# Patient Record
Sex: Female | Born: 1935 | Race: White | Hispanic: No | Smoking: Former smoker
Health system: Southern US, Community
[De-identification: ages and names within clinical notes are randomized; demographics above are authoritative.]

## PROBLEM LIST (undated history)

## (undated) DIAGNOSIS — F419 Anxiety disorder, unspecified: Secondary | ICD-10-CM

## (undated) DIAGNOSIS — Z950 Presence of cardiac pacemaker: Secondary | ICD-10-CM

## (undated) DIAGNOSIS — K5792 Diverticulitis of intestine, part unspecified, without perforation or abscess without bleeding: Secondary | ICD-10-CM

## (undated) DIAGNOSIS — I499 Cardiac arrhythmia, unspecified: Secondary | ICD-10-CM

## (undated) DIAGNOSIS — M6282 Rhabdomyolysis: Secondary | ICD-10-CM

## (undated) DIAGNOSIS — I495 Sick sinus syndrome: Secondary | ICD-10-CM

## (undated) DIAGNOSIS — K219 Gastro-esophageal reflux disease without esophagitis: Secondary | ICD-10-CM

## (undated) DIAGNOSIS — N289 Disorder of kidney and ureter, unspecified: Secondary | ICD-10-CM

## (undated) DIAGNOSIS — I10 Essential (primary) hypertension: Secondary | ICD-10-CM

## (undated) DIAGNOSIS — F22 Delusional disorders: Secondary | ICD-10-CM

## (undated) DIAGNOSIS — K623 Rectal prolapse: Secondary | ICD-10-CM

## (undated) DIAGNOSIS — F0280 Dementia in other diseases classified elsewhere without behavioral disturbance: Secondary | ICD-10-CM

## (undated) DIAGNOSIS — G3183 Dementia with Lewy bodies: Secondary | ICD-10-CM

## (undated) DIAGNOSIS — D649 Anemia, unspecified: Secondary | ICD-10-CM

## (undated) DIAGNOSIS — E119 Type 2 diabetes mellitus without complications: Secondary | ICD-10-CM

## (undated) DIAGNOSIS — G459 Transient cerebral ischemic attack, unspecified: Secondary | ICD-10-CM

## (undated) HISTORY — DX: Sick sinus syndrome: I49.5

## (undated) HISTORY — DX: Anxiety disorder, unspecified: F41.9

## (undated) HISTORY — DX: Gastro-esophageal reflux disease without esophagitis: K21.9

## (undated) HISTORY — PX: CATARACT EXTRACTION: SUR2

## (undated) HISTORY — DX: Rectal prolapse: K62.3

## (undated) HISTORY — PX: ABDOMINAL HYSTERECTOMY: SHX81

## (undated) HISTORY — DX: Cardiac arrhythmia, unspecified: I49.9

## (undated) HISTORY — PX: HERNIA REPAIR: SHX51

## (undated) HISTORY — PX: INSERT / REPLACE / REMOVE PACEMAKER: SUR710

## (undated) HISTORY — PX: ABDOMINAL SURGERY: SHX537

## (undated) HISTORY — PX: CHOLECYSTECTOMY: SHX55

## (undated) HISTORY — PX: CARDIAC SURGERY: SHX584

## (undated) HISTORY — PX: COLON SURGERY: SHX602

## (undated) HISTORY — PX: KNEE SURGERY: SHX244

## (undated) HISTORY — PX: NASAL SINUS SURGERY: SHX719

---

## 2016-04-18 DIAGNOSIS — L309 Dermatitis, unspecified: Secondary | ICD-10-CM | POA: Diagnosis not present

## 2016-05-09 DIAGNOSIS — D631 Anemia in chronic kidney disease: Secondary | ICD-10-CM | POA: Diagnosis not present

## 2016-05-09 DIAGNOSIS — R413 Other amnesia: Secondary | ICD-10-CM | POA: Diagnosis not present

## 2016-05-09 DIAGNOSIS — E1122 Type 2 diabetes mellitus with diabetic chronic kidney disease: Secondary | ICD-10-CM | POA: Diagnosis not present

## 2016-05-09 DIAGNOSIS — I10 Essential (primary) hypertension: Secondary | ICD-10-CM | POA: Diagnosis not present

## 2016-05-09 DIAGNOSIS — E785 Hyperlipidemia, unspecified: Secondary | ICD-10-CM | POA: Diagnosis not present

## 2016-05-09 DIAGNOSIS — N183 Chronic kidney disease, stage 3 (moderate): Secondary | ICD-10-CM | POA: Diagnosis not present

## 2016-05-09 DIAGNOSIS — Z950 Presence of cardiac pacemaker: Secondary | ICD-10-CM | POA: Diagnosis not present

## 2016-05-09 DIAGNOSIS — F3340 Major depressive disorder, recurrent, in remission, unspecified: Secondary | ICD-10-CM | POA: Diagnosis not present

## 2016-05-09 DIAGNOSIS — E790 Hyperuricemia without signs of inflammatory arthritis and tophaceous disease: Secondary | ICD-10-CM | POA: Diagnosis not present

## 2016-05-12 ENCOUNTER — Telehealth: Payer: Self-pay

## 2016-05-12 NOTE — Telephone Encounter (Signed)
SENT NOTES TO SCHEDULING 

## 2016-05-30 ENCOUNTER — Emergency Department (HOSPITAL_COMMUNITY)
Admission: EM | Admit: 2016-05-30 | Discharge: 2016-05-30 | Disposition: A | Discharge status: Home / Self Care | Payer: PPO | Attending: Emergency Medicine | Admitting: Emergency Medicine

## 2016-05-30 ENCOUNTER — Encounter (HOSPITAL_COMMUNITY): Payer: Self-pay | Admitting: Emergency Medicine

## 2016-05-30 DIAGNOSIS — K2970 Gastritis, unspecified, without bleeding: Secondary | ICD-10-CM | POA: Diagnosis not present

## 2016-05-30 DIAGNOSIS — I10 Essential (primary) hypertension: Secondary | ICD-10-CM | POA: Insufficient documentation

## 2016-05-30 DIAGNOSIS — Z87891 Personal history of nicotine dependence: Secondary | ICD-10-CM | POA: Insufficient documentation

## 2016-05-30 DIAGNOSIS — A09 Infectious gastroenteritis and colitis, unspecified: Secondary | ICD-10-CM | POA: Insufficient documentation

## 2016-05-30 DIAGNOSIS — R197 Diarrhea, unspecified: Secondary | ICD-10-CM | POA: Diagnosis not present

## 2016-05-30 DIAGNOSIS — R110 Nausea: Secondary | ICD-10-CM | POA: Diagnosis not present

## 2016-05-30 HISTORY — DX: Essential (primary) hypertension: I10

## 2016-05-30 HISTORY — DX: Disorder of kidney and ureter, unspecified: N28.9

## 2016-05-30 HISTORY — DX: Anemia, unspecified: D64.9

## 2016-05-30 HISTORY — DX: Diverticulitis of intestine, part unspecified, without perforation or abscess without bleeding: K57.92

## 2016-05-30 LAB — COMPREHENSIVE METABOLIC PANEL WITH GFR
ALT: 27 U/L (ref 14–54)
AST: 39 U/L (ref 15–41)
Albumin: 3.7 g/dL (ref 3.5–5.0)
Alkaline Phosphatase: 118 U/L (ref 38–126)
Anion gap: 11 (ref 5–15)
BUN: 20 mg/dL (ref 6–20)
CO2: 21 mmol/L — ABNORMAL LOW (ref 22–32)
Calcium: 9.6 mg/dL (ref 8.9–10.3)
Chloride: 111 mmol/L (ref 101–111)
Creatinine, Ser: 1 mg/dL (ref 0.44–1.00)
GFR calc Af Amer: >60 mL/min
GFR calc non Af Amer: 52 mL/min — ABNORMAL LOW
Glucose, Bld: 189 mg/dL — ABNORMAL HIGH (ref 65–99)
Potassium: 4.5 mmol/L (ref 3.5–5.1)
Sodium: 143 mmol/L (ref 135–145)
Total Bilirubin: 0.4 mg/dL (ref 0.3–1.2)
Total Protein: 6.8 g/dL (ref 6.5–8.1)

## 2016-05-30 LAB — CBC WITH DIFFERENTIAL/PLATELET
Basophils Absolute: 0 10*3/uL (ref 0.0–0.1)
Basophils Relative: 0 %
Eosinophils Absolute: 0.1 10*3/uL (ref 0.0–0.7)
Eosinophils Relative: 1 %
HCT: 40.8 % (ref 36.0–46.0)
Hemoglobin: 13.5 g/dL (ref 12.0–15.0)
Lymphocytes Relative: 21 %
Lymphs Abs: 2.4 10*3/uL (ref 0.7–4.0)
MCH: 29.2 pg (ref 26.0–34.0)
MCHC: 33.1 g/dL (ref 30.0–36.0)
MCV: 88.1 fL (ref 78.0–100.0)
Monocytes Absolute: 0.8 10*3/uL (ref 0.1–1.0)
Monocytes Relative: 6 %
Neutro Abs: 8.4 10*3/uL — ABNORMAL HIGH (ref 1.7–7.7)
Neutrophils Relative %: 72 %
Platelets: 201 10*3/uL (ref 150–400)
RBC: 4.63 MIL/uL (ref 3.87–5.11)
RDW: 12.8 % (ref 11.5–15.5)
WBC: 11.7 10*3/uL — ABNORMAL HIGH (ref 4.0–10.5)

## 2016-05-30 LAB — LIPASE, BLOOD: Lipase: 25 U/L (ref 11–51)

## 2016-05-30 MED ORDER — ONDANSETRON HCL 4 MG PO TABS: 4.0000 mg | ORAL_TABLET | Freq: Four times a day (QID) | ORAL | 0 refills | Status: DC

## 2016-05-30 MED ORDER — PROMETHAZINE HCL 25 MG PO TABS: 25.0000 mg | ORAL_TABLET | Freq: Four times a day (QID) | ORAL | 0 refills | Status: DC | PRN

## 2016-05-30 MED ORDER — LOPERAMIDE HCL 2 MG PO CAPS: 2.0000 mg | ORAL_CAPSULE | Freq: Four times a day (QID) | ORAL | 0 refills | Status: DC | PRN

## 2016-05-30 MED ORDER — SODIUM CHLORIDE 0.9 % IV BOLUS (SEPSIS)
1000.0000 mL | Freq: Once | INTRAVENOUS | Status: AC
  Administered 2016-05-30: 1000 mL via INTRAVENOUS

## 2016-05-30 MED ORDER — ONDANSETRON HCL 4 MG/2ML IJ SOLN
4.0000 mg | Freq: Once | INTRAMUSCULAR | Status: AC
  Administered 2016-05-30: 4 mg via INTRAVENOUS
  Filled 2016-05-30: qty 2

## 2016-05-30 NOTE — ED Notes (Signed)
This writer attempt blood draw twice without successful. Pt jumps with needle stick.  Spencer aware.

## 2016-05-30 NOTE — ED Notes (Signed)
Phlebotomy at bedside.

## 2016-05-30 NOTE — ED Triage Notes (Signed)
Per EMS, patient from home c/o generalized abdominal pain and 7 episodes of diarrhea since eating "a batch of clams from her freezer" for dinner. Ambulatory with assistance. A&Ox4.

## 2016-05-30 NOTE — ED Notes (Signed)
RN at bedside collecting labs 

## 2016-05-30 NOTE — ED Notes (Signed)
This Clinical research associatewriter attempted once to collect labs. Not enough blood to fill tube. PT/family request for special machine (ultrasound) to be used due to hard stick

## 2016-05-30 NOTE — ED Notes (Signed)
ED Provider at bedside. 

## 2016-05-30 NOTE — ED Notes (Signed)
Bed: [id number] Expected date:  Expected time:  Means of arrival:  Comments: 83 yo abd pain, diarrhea

## 2016-05-30 NOTE — ED Notes (Addendum)
Main lab phlebotomy paged for blood draw. MD made aware of delay.

## 2016-05-30 NOTE — ED Provider Notes (Signed)
[facility]-EMERGENCY DEPT Provider Note   CSN: [id number] Arrival date & time: 05/30/16  1116     History   Chief Complaint Chief Complaint  Patient presents with  . Abdominal Pain  . Diarrhea    HPI Monica Choi is a 83 y.o. female.  She is an elderly female with a history of hypertension, diverticulitis and anemia presenting today with abdominal pain, nausea and diarrhea. Patient states yesterday afternoon she started to feel queasy which worsened as the night progressed. Around 6:00 she decided to make some food but only was able to take one bite because it did not taste good. By 7 PM she started to have diarrhea stool. She also complains of a discomfort and bloating in her epigastric region. This has persisted all night long. She states every 1-2 hours she would have an episode of diarrhea and severe abdominal cramping which would then improve until the next episode. She has nausea but denies any vomiting. No melena or bloody stool. She denies fever. No urinary symptoms. No chest pain or shortness of breath. She denies any URI symptoms. No recent medication changes. She did take a Tylenol because she has chronic shoulder pain and having to get up and down from bed all night long has made her right shoulder hurts worse with the Tylenol seemed to improve those symptoms. She denies any recent antibiotic use or travel. No recent procedures.   The history is provided by the patient.  Abdominal Pain   This is a new problem. Associated symptoms include diarrhea.  Diarrhea   Associated symptoms include abdominal pain.    Past Medical History:  Diagnosis Date  . Anemia   . Diverticulitis   . Hypertension   . Renal disorder     There are no active problems to display for this patient.   Past Surgical History:  Procedure Laterality Date  . ABDOMINAL HYSTERECTOMY    . ABDOMINAL SURGERY      OB History    No data available       Home Medications    Prior to Admission  medications   Not on File    Family History No family history on file.  Social History Social History  Substance Use Topics  . Smoking status: Former Games developer  . Smokeless tobacco: Never Used  . Alcohol use Not on file     Allergies   Patient has no allergy information on record.   Review of Systems Review of Systems  Gastrointestinal: Positive for abdominal pain and diarrhea.  All other systems reviewed and are negative.    Physical Exam Updated Vital Signs BP 197/78 (BP Location: Left Arm)   Pulse 92   Temp 98.1 F (36.7 C) (Oral)   Resp 18   Ht 5\' 4"  (1.626 m)   Wt 140 lb (63.5 kg)   SpO2 98%   BMI 24.03 kg/m   Physical Exam  Constitutional: She is oriented to person, place, and time. She appears well-developed and well-nourished. No distress.  HENT:  Head: Normocephalic and atraumatic.  Mouth/Throat: Oropharynx is clear and moist. Mucous membranes are dry.  Eyes: Conjunctivae and EOM are normal. Pupils are equal, round, and reactive to light.  Neck: Normal range of motion. Neck supple.  Cardiovascular: Normal rate, regular rhythm and intact distal pulses.   No murmur heard. Pulmonary/Chest: Effort normal and breath sounds normal. No respiratory distress. She has no wheezes. She has no rales.  Abdominal: Soft. She exhibits distension. There is no tenderness. There  is no rebound and no guarding.  Mild epigastric distention. Multiple well-healed surgical scars with potential ventral hernia but soft and reducible. Positive bowel sounds mild tenderness but no rebound or guarding  Musculoskeletal: Normal range of motion. She exhibits no edema or tenderness.  Neurological: She is alert and oriented to person, place, and time.  Skin: Skin is warm and dry. No rash noted. No erythema.  Psychiatric: She has a normal mood and affect. Her behavior is normal.  Nursing note and vitals reviewed.    ED Treatments / Results  Labs (all labs ordered are listed, but only  abnormal results are displayed) Labs Reviewed  CBC WITH DIFFERENTIAL/PLATELET - Abnormal; Notable for the following:       Result Value   WBC 11.7 (*)    Neutro Abs 8.4 (*)    All other components within normal limits  COMPREHENSIVE METABOLIC PANEL - Abnormal; Notable for the following:    CO2 21 (*)    Glucose, Bld 189 (*)    GFR calc non Af Amer 52 (*)    All other components within normal limits  LIPASE, BLOOD    EKG  EKG Interpretation None       Radiology No results found.  Procedures Procedures (including critical care time)  Medications Ordered in ED Medications  ondansetron (ZOFRAN) injection 4 mg (not administered)  sodium chloride 0.9 % bolus 1,000 mL (not administered)     Initial Impression / Assessment and Plan / ED Course  I have reviewed the triage vital signs and the nursing notes.  Pertinent labs & imaging results that were available during my care of the patient were reviewed by me and considered in my medical decision making (see chart for details).     Pt with symptoms most consistent with a viral process with abrupt onset of epigastric discomfort crampig /diarrhea.  Denies bad food exposure and recent travel out of the country.  No recent abx.  No hx concerning for GU pathology or kidney stones.  Pt is awake and alert on exam without peritoneal signs.  Low suspicion for C. difficile, diverticulitis or pancreatitis at this time. Patient has had a cholecystectomy and appendectomy. She does not know if she's had any sick exposures. Will treat with IV fluids and Zofran. CBC, CMP, lipase pending  3:20 PM Labs relatively normal and pt feeling better after zofran and IVF.  Tolerating po's.  Will d/c home.    Final Clinical Impressions(s) / ED Diagnoses   Final diagnoses:  Diarrhea of presumed infectious origin    New Prescriptions New Prescriptions   LOPERAMIDE (IMODIUM) 2 MG CAPSULE    Take 1 capsule (2 mg total) by mouth 4 (four) times daily  as needed for diarrhea or loose stools.   ONDANSETRON (ZOFRAN) 4 MG TABLET    Take 1 tablet (4 mg total) by mouth every 6 (six) hours.     Gwyneth SproutWhitney Natale Barba, MD 05/30/16 [phone]

## 2016-06-13 DIAGNOSIS — I10 Essential (primary) hypertension: Secondary | ICD-10-CM | POA: Diagnosis not present

## 2016-06-13 DIAGNOSIS — M109 Gout, unspecified: Secondary | ICD-10-CM | POA: Diagnosis not present

## 2016-06-13 DIAGNOSIS — F3340 Major depressive disorder, recurrent, in remission, unspecified: Secondary | ICD-10-CM | POA: Diagnosis not present

## 2016-06-13 DIAGNOSIS — M25512 Pain in left shoulder: Secondary | ICD-10-CM | POA: Diagnosis not present

## 2016-06-13 DIAGNOSIS — M25511 Pain in right shoulder: Secondary | ICD-10-CM | POA: Diagnosis not present

## 2016-06-18 ENCOUNTER — Telehealth: Payer: Self-pay | Admitting: Cardiology

## 2016-06-18 NOTE — Telephone Encounter (Signed)
[facility] on 07/09/16 with Dr SwazilandJordan.  Records put with Dr Elvis CoilJordan's schedule for 07/09/16. lp

## 2016-06-23 DIAGNOSIS — G8929 Other chronic pain: Secondary | ICD-10-CM | POA: Diagnosis not present

## 2016-06-23 DIAGNOSIS — M25511 Pain in right shoulder: Secondary | ICD-10-CM | POA: Diagnosis not present

## 2016-06-23 DIAGNOSIS — M25512 Pain in left shoulder: Secondary | ICD-10-CM | POA: Diagnosis not present

## 2016-07-03 DIAGNOSIS — N183 Chronic kidney disease, stage 3 (moderate): Secondary | ICD-10-CM | POA: Diagnosis not present

## 2016-07-03 DIAGNOSIS — R413 Other amnesia: Secondary | ICD-10-CM | POA: Diagnosis not present

## 2016-07-03 DIAGNOSIS — E1122 Type 2 diabetes mellitus with diabetic chronic kidney disease: Secondary | ICD-10-CM | POA: Diagnosis not present

## 2016-07-03 DIAGNOSIS — E785 Hyperlipidemia, unspecified: Secondary | ICD-10-CM | POA: Diagnosis not present

## 2016-07-03 DIAGNOSIS — I129 Hypertensive chronic kidney disease with stage 1 through stage 4 chronic kidney disease, or unspecified chronic kidney disease: Secondary | ICD-10-CM | POA: Diagnosis not present

## 2016-07-03 DIAGNOSIS — Z950 Presence of cardiac pacemaker: Secondary | ICD-10-CM | POA: Diagnosis not present

## 2016-07-03 DIAGNOSIS — M109 Gout, unspecified: Secondary | ICD-10-CM | POA: Diagnosis not present

## 2016-07-03 DIAGNOSIS — D509 Iron deficiency anemia, unspecified: Secondary | ICD-10-CM | POA: Diagnosis not present

## 2016-07-04 ENCOUNTER — Other Ambulatory Visit: Payer: Self-pay | Admitting: Nephrology

## 2016-07-04 DIAGNOSIS — N183 Chronic kidney disease, stage 3 unspecified: Secondary | ICD-10-CM

## 2016-07-04 DIAGNOSIS — I129 Hypertensive chronic kidney disease with stage 1 through stage 4 chronic kidney disease, or unspecified chronic kidney disease: Secondary | ICD-10-CM

## 2016-07-06 DIAGNOSIS — G459 Transient cerebral ischemic attack, unspecified: Secondary | ICD-10-CM

## 2016-07-06 HISTORY — DX: Transient cerebral ischemic attack, unspecified: G45.9

## 2016-07-08 NOTE — Progress Notes (Signed)
Cardiology Office Note    Date:  07/09/2016   ID:  Monica Choi, DOB 1933-12-26, MRN [id number]  PCP:  Monica Obey, MD  Cardiologist:  Monica Langille Martinique, MD    History of Present Illness:  Monica Choi is a 83 y.o. female seen at the request of Dr. Doreene Choi to establish cardiac care. She has a history of HTN, HLD, and prior pacemaker implant ([employer] PM [id number] serial [id number]). Per records this was placed in January 2016. The patient reports she had syncopal episodes and pauses of 6-7 seconds.  Echo at that time showed normal LV function and mild aortic and mitral insufficiency. She had a normal cardiac cath in August 2015. She has a history of DM type 2 with CKD stage 3 and chronic anemia. She has moved from [place] to live closer to her daughter here.  She is seen with her daughter today. States she is not very active. States her "heart pounds" with activity. Denies any chest pain, palpitations, dizziness, syncope, edema, orthopnea, PND. Does note a 20 lb weight loss. Memory loss. Bruises easily. Denies any claudication. Has seen Nephrology recently- Monica Choi and some type of renal scan is planned.    Past Medical History:  Diagnosis Date  . Anemia   . Anxiety   . Arrhythmia   . Diverticulitis   . GERD (gastroesophageal reflux disease)   . Hypertension   . Rectal prolapse   . Renal disorder   . Sinus node dysfunction [facility])     Past Surgical History:  Procedure Laterality Date  . ABDOMINAL HYSTERECTOMY    . ABDOMINAL SURGERY    . CARDIAC SURGERY    . CATARACT EXTRACTION Bilateral   . CHOLECYSTECTOMY    . COLON SURGERY    . HERNIA REPAIR    . KNEE SURGERY    . NASAL SINUS SURGERY      Current Medications: Outpatient Medications Prior to Visit  Medication Sig Dispense Refill  . acetaminophen (ACETAMINOPHEN 8 HOUR) 650 MG CR tablet Take 1,300 mg by mouth every 8 (eight) hours as needed for pain.     Marland Kitchen allopurinol (ZYLOPRIM) 100 MG tablet Take 1 tablet by mouth  daily.    Marland Kitchen amLODipine (NORVASC) 10 MG tablet Take 10 mg by mouth daily.    Marland Kitchen aspirin 81 MG chewable tablet Chew 81 mg by mouth daily.    Marland Kitchen BIOTIN 5000 PO Take 5,000 mg by mouth daily.    Marland Kitchen bismuth subsalicylate (PEPTO BISMOL) 262 MG chewable tablet Chew 2 tablets by mouth as needed.    . cetirizine (ZYRTEC) 10 MG tablet Take 10 mg by mouth daily.    . Cholecalciferol (VITAMIN D) 2000 units CAPS Take 5,000 Units by mouth daily.    . DULoxetine (CYMBALTA) 30 MG capsule Take 30 mg by mouth 2 (two) times daily.    . hydrOXYzine (ATARAX/VISTARIL) 10 MG tablet Take 10 mg by mouth at bedtime.    Marland Kitchen loperamide (IMODIUM) 2 MG capsule Take 1 capsule (2 mg total) by mouth 4 (four) times daily as needed for diarrhea or loose stools. 12 capsule 0  . nitroGLYCERIN (NITROSTAT) 0.4 MG SL tablet Place 0.4 mg under the tongue every 5 (five) minutes as needed for chest pain.    Marland Kitchen ondansetron (ZOFRAN) 4 MG tablet Take 1 tablet (4 mg total) by mouth every 6 (six) hours. 12 tablet 0  . pantoprazole (PROTONIX) 20 MG tablet Take 20 mg by mouth daily.    . promethazine (  PHENERGAN) 25 MG tablet Take 1 tablet (25 mg total) by mouth every 6 (six) hours as needed for nausea or vomiting. 10 tablet 0  . ramipril (ALTACE) 10 MG capsule Take 10 mg by mouth 2 (two) times daily.    . sertraline (ZOLOFT) 100 MG tablet Take 100 mg by mouth as needed.    . simvastatin (ZOCOR) 10 MG tablet Take 10 mg by mouth daily.    . sodium bicarbonate 650 MG tablet Take 650 mg by mouth daily.    . DULoxetine (CYMBALTA) 30 MG capsule Take 30 mg by mouth 2 (two) times daily.    . hydrOXYzine (ATARAX/VISTARIL) 10 MG tablet Take 10 mg by mouth daily.    . nitroGLYCERIN (NITROSTAT) 0.3 MG SL tablet Place 0.3 mg under the tongue as needed for chest pain.    . promethazine (PHENERGAN) 25 MG tablet Take 25 mg by mouth as needed.     No facility-administered medications prior to visit.      Allergies:   Morphine; Sulfa antibiotics; Amoxicillin;  Benzonatate; Buprenorphine; Ciprofloxacin; and Morphine and related   Social History   Social History  . Marital status: Married    Spouse name: N/A  . Number of children: N/A  . Years of education: N/A   Social History Main Topics  . Smoking status: Former Research scientist (life sciences)  . Smokeless tobacco: Never Used  . Alcohol use None  . Drug use: Unknown  . Sexual activity: Not Asked   Other Topics Concern  . None   Social History Narrative  . None     Family History:  The patient's family history includes Heart attack in her brother and father.   ROS:   Please see the history of present illness.   Patient also complains of hair loss, easy bruisability, itching, poor memory, weight loss, increased varicose veins.  ROS All other systems reviewed and are negative.   PHYSICAL EXAM:   VS:  BP (!) 171/72 (BP Location: Right Arm)   Pulse 91   Ht '5\' 4"'$  (1.626 m)   Wt 143 lb 3.2 oz (65 kg)   BMI 24.58 kg/m    GEN: Well nourished, elderly WF, in no acute distress  HEENT: normal  Neck: no JVD, carotid bruits, or masses Cardiac: RRR; normal S1-2 with grade 6-1/4 systolic murmur heard throughout precordium but most pronounced at the apex. No edema. No rub. Pacer site in left subclavicular area is OK.  Respiratory:  clear to auscultation bilaterally, normal work of breathing GI: soft, nontender, nondistended, + BS, midline abdominal and renal bruits heard. Bilateral femoral bruits R>L.  MS: no deformity or atrophy  Skin: warm and dry, no rash. Bruising on hands and feet. Superficial varicosities in LE Neuro:  Alert and Oriented x 3, Strength and sensation are intact Psych: euthymic mood, full affect, memory is poor. Very talkative.  Wt Readings from Last 3 Encounters:  07/09/16 143 lb 3.2 oz (65 kg)  05/30/16 140 lb (63.5 kg)      Studies/Labs Reviewed:   EKG:  EKG is ordered today.  The ekg ordered today demonstrates NSR with LAFB, LVH with repolarization abnormality. Cannot rule out  septal infarct age undetermined. I have personally reviewed and interpreted this study.   Recent Labs: 05/30/2016: ALT 27; BUN 20; Creatinine, Ser 1.00; Hemoglobin 13.5; Platelets 201; Potassium 4.5; Sodium 143   Lipid Panel No results found for: CHOL, TRIG, HDL, CHOLHDL, VLDL, LDLCALC, LDLDIRECT  Additional studies/ records that were reviewed today include:  Labs dated 05/09/16: BUN 20, creatinine 1.29, sodium 150, alk phos 161. Other chemistries normal. A1c 7.7%.   ASSESSMENT:    1. Sinus node dysfunction (HCC)   2. Essential hypertension   3. Abdominal bruit   4. Renal bruit   5. Hypertensive heart disease without heart failure      PLAN:  In order of problems listed above:  1. She is s/p pacemaker in 2016. No recurrent syncope. Needs to get established with EP in our pacer clinic for follow up.  2. Patient has hypertension with hypertensive heart disease (LVH on Ecg). In presence of CKD and renal artery bruits need to evaluate for RAS with renal duplex study. This may have already been ordered by Nephrology. Will obtain records to confirm. I will defer medical treatment of her HTN to her primary care and Nephrology so that her care can be better coordinated. 3. PAD with abdominal, renal, femoral bruits. No claudication and peripheral pulses are good. Encourage walking. Agree with ASA 81 mg daily. Work on risk factors of BP, hypercholesterolemia, DM.    Since primary cardiac issue is her sinus node dysfunction will arrange follow up with EP. I will see her as needed.     Medication Adjustments/Labs and Tests Ordered: Current medicines are reviewed at length with the patient today.  Concerns regarding medicines are outlined above.  Medication changes, Labs and Tests ordered today are listed in the Patient Instructions below. Patient Instructions  We will arrange follow up in our pacemaker clinic with one of our Electrophysiologist  We will request records from Monica Choi.  You need a doppler/ultrasound of your kidneys        Signed, Vangie Henthorn Martinique, MD  07/09/2016 9:53 AM    [facility] [address] [place], [place], [address] [phone]

## 2016-07-09 ENCOUNTER — Ambulatory Visit (INDEPENDENT_AMBULATORY_CARE_PROVIDER_SITE_OTHER): Payer: PPO | Admitting: Cardiology

## 2016-07-09 ENCOUNTER — Encounter: Payer: Self-pay | Admitting: Cardiology

## 2016-07-09 VITALS — BP 171/72 | HR 91 | Ht 64.0 in | Wt 143.2 lb

## 2016-07-09 DIAGNOSIS — I495 Sick sinus syndrome: Secondary | ICD-10-CM

## 2016-07-09 DIAGNOSIS — R0989 Other specified symptoms and signs involving the circulatory and respiratory systems: Secondary | ICD-10-CM | POA: Diagnosis not present

## 2016-07-09 DIAGNOSIS — I10 Essential (primary) hypertension: Secondary | ICD-10-CM | POA: Diagnosis not present

## 2016-07-09 DIAGNOSIS — I119 Hypertensive heart disease without heart failure: Secondary | ICD-10-CM

## 2016-07-09 NOTE — Patient Instructions (Signed)
We will arrange follow up in our pacemaker clinic with one of our Electrophysiologist  We will request records from Dr. Eulogio Ditch. You need a doppler/ultrasound of your kidneys

## 2016-07-17 ENCOUNTER — Ambulatory Visit (INDEPENDENT_AMBULATORY_CARE_PROVIDER_SITE_OTHER): Payer: PPO | Admitting: Cardiology

## 2016-07-17 ENCOUNTER — Other Ambulatory Visit: Payer: PPO

## 2016-07-17 ENCOUNTER — Encounter: Payer: Self-pay | Admitting: Cardiology

## 2016-07-17 VITALS — BP 160/88 | HR 94 | Ht 64.0 in | Wt 141.8 lb

## 2016-07-17 DIAGNOSIS — I10 Essential (primary) hypertension: Secondary | ICD-10-CM

## 2016-07-17 DIAGNOSIS — I495 Sick sinus syndrome: Secondary | ICD-10-CM

## 2016-07-17 MED ORDER — CARVEDILOL 12.5 MG PO TABS: 12.5000 mg | ORAL_TABLET | Freq: Two times a day (BID) | ORAL | 11 refills | Status: AC

## 2016-07-17 NOTE — Patient Instructions (Addendum)
Medication Instructions:    Your physician has recommended you make the following change in your medication: 1) START Carvedilol 12.5 mg twice day  --- If you need a refill on your cardiac medications before your next appointment, please call your pharmacy. ---  Labwork:  None ordered  Testing/Procedures:  None ordered  Follow-Up:  Your physician recommends that you schedule a follow-up appointment in: 3 months with device clinic.   Your physician wants you to follow-up in: 1 year with Dr. Elberta Fortis.  You will receive a reminder letter in the mail two months in advance. If you don't receive a letter, please call our office to schedule the follow-up appointment.  Thank you for choosing [facility]!!   Dory Horn, RN [phone]    Any Other Special Instructions Will Be Listed Below (If Applicable).  Carvedilol tablets What is this medicine? CARVEDILOL (KAR ve dil ol) is a beta-blocker. Beta-blockers reduce the workload on the heart and help it to beat more regularly. This medicine is used to treat high blood pressure and heart failure. This medicine may be used for other purposes; ask your health care provider or pharmacist if you have questions. COMMON BRAND NAME(S): Coreg What should I tell my health care provider before I take this medicine? They need to know if you have any of these conditions: -circulation problems -diabetes -history of heart attack or heart disease -liver disease -lung or breathing disease, like asthma or emphysema -pheochromocytoma -slow or irregular heartbeat -thyroid disease -an unusual or allergic reaction to carvedilol, other beta-blockers, medicines, foods, dyes, or preservatives -pregnant or trying to get pregnant -breast-feeding How should I use this medicine? Take this medicine by mouth with a glass of water. Follow the directions on the prescription label. It is best to take the tablets with food. Take your doses at regular  intervals. Do not take your medicine more often than directed. Do not stop taking except on the advice of your doctor or health care professional. Talk to your pediatrician regarding the use of this medicine in children. Special care may be needed. Overdosage: If you think you have taken too much of this medicine contact a poison control center or emergency room at once. NOTE: This medicine is only for you. Do not share this medicine with others. What if I miss a dose? If you miss a dose, take it as soon as you can. If it is almost time for your next dose, take only that dose. Do not take double or extra doses. What may interact with this medicine? This medicine may interact with the following medications: -certain medicines for blood pressure, heart disease, irregular heart beat -certain medicines for depression, like fluoxetine or paroxetine -certain medicines for diabetes, like glipizide or glyburide -cimetidine -clonidine -cyclosporine -digoxin -MAOIs like Carbex, Eldepryl, Marplan, Nardil, and Parnate -reserpine -rifampin This list may not describe all possible interactions. Give your health care provider a list of all the medicines, herbs, non-prescription drugs, or dietary supplements you use. Also tell them if you smoke, drink alcohol, or use illegal drugs. Some items may interact with your medicine. What should I watch for while using this medicine? Check your heart rate and blood pressure regularly while you are taking this medicine. Ask your doctor or health care professional what your heart rate and blood pressure should be, and when you should contact him or her. Do not stop taking this medicine suddenly. This could lead to serious heart-related effects. Contact your doctor or health care professional  if you have difficulty breathing while taking this drug. Check your weight daily. Ask your doctor or health care professional when you should notify him/her of any weight gain. You  may get drowsy or dizzy. Do not drive, use machinery, or do anything that requires mental alertness until you know how this medicine affects you. To reduce the risk of dizzy or fainting spells, do not sit or stand up quickly. Alcohol can make you more drowsy, and increase flushing and rapid heartbeats. Avoid alcoholic drinks. If you have diabetes, check your blood sugar as directed. Tell your doctor if you have changes in your blood sugar while you are taking this medicine. If you are going to have surgery, tell your doctor or health care professional that you are taking this medicine. What side effects may I notice from receiving this medicine? Side effects that you should report to your doctor or health care professional as soon as possible: -allergic reactions like skin rash, itching or hives, swelling of the face, lips, or tongue -breathing problems -dark urine -irregular heartbeat -swollen legs or ankles -vomiting -yellowing of the eyes or skin Side effects that usually do not require medical attention (report to your doctor or health care professional if they continue or are bothersome): -change in sex drive or performance -diarrhea -dry eyes (especially if wearing contact lenses) -dry, itching skin -headache -nausea -unusually tired This list may not describe all possible side effects. Call your doctor for medical advice about side effects. You may report side effects to FDA at 1-800-FDA-1088. Where should I keep my medicine? Keep out of the reach of children. Store at room temperature below 30 degrees C (86 degrees F). Protect from moisture. Keep container tightly closed. Throw away any unused medicine after the expiration date. NOTE: This sheet is a summary. It may not cover all possible information. If you have questions about this medicine, talk to your doctor, pharmacist, or health care provider.  2018 Elsevier/Gold Standard (2012-11-28 14:12:02)

## 2016-07-17 NOTE — Addendum Note (Signed)
Addended by: Baird Lyons on: 07/17/2016 12:06 PM   Modules accepted: Orders

## 2016-07-17 NOTE — Progress Notes (Signed)
Electrophysiology Office Note   Date:  07/17/2016   ID:  Monica Choi, DOB July 12, 1933, MRN [id number]  PCP:  Delbert Harness, MD  Cardiologist:  [place] Primary Electrophysiologist:  Regan Lemming, MD    Chief Complaint  Patient presents with  . Pacemaker Check    Sinus node dysfunction     History of Present Illness: Monica Choi is a 83 y.o. female who is being seen today for the evaluation of sinus node dysfunction and pacemaker at the request of Macy Mis, MD. Presenting today for electrophysiology evaluation. She has a history of HTN, HLD, and prior pacemaker implant [facility] Jude Model PM [id number] serial [id number]). Per records this was placed in January 2016. The patient reports she had syncopal episodes and pauses of 6-7 seconds.  Echo at that time showed normal LV function and mild aortic and mitral insufficiency. She had a normal cardiac cath in August 2015. She has a history of DM type 2 with CKD stage 3 and chronic anemia. She has moved from [place] to live closer to her daughter here.   Today, she denies symptoms of palpitations, chest pain, shortness of breath, orthopnea, PND, lower extremity edema, claudication, dizziness, presyncope, syncope, bleeding, or neurologic sequela. The patient is tolerating medications without difficulties.    Past Medical History:  Diagnosis Date  . Anemia   . Anxiety   . Arrhythmia   . Diverticulitis   . GERD (gastroesophageal reflux disease)   . Hypertension   . Rectal prolapse   . Renal disorder   . Sinus node dysfunction [facility])    Past Surgical History:  Procedure Laterality Date  . ABDOMINAL HYSTERECTOMY    . ABDOMINAL SURGERY    . CARDIAC SURGERY    . CATARACT EXTRACTION Bilateral   . CHOLECYSTECTOMY    . COLON SURGERY    . HERNIA REPAIR    . KNEE SURGERY    . NASAL SINUS SURGERY       Current Outpatient Prescriptions  Medication Sig Dispense Refill  . acetaminophen (ACETAMINOPHEN 8 HOUR) 650 MG CR tablet Take 1,300  mg by mouth every 8 (eight) hours as needed for pain.     Marland Kitchen allopurinol (ZYLOPRIM) 100 MG tablet Take 1 tablet by mouth daily.    Marland Kitchen amLODipine (NORVASC) 10 MG tablet Take 10 mg by mouth daily.    Marland Kitchen aspirin 81 MG chewable tablet Chew 81 mg by mouth daily.    Marland Kitchen BIOTIN 5000 PO Take 5,000 mg by mouth daily.    Marland Kitchen bismuth subsalicylate (PEPTO BISMOL) 262 MG chewable tablet Chew 2 tablets by mouth as needed.    . cetirizine (ZYRTEC) 10 MG tablet Take 10 mg by mouth daily.    . Cholecalciferol (VITAMIN D) 2000 units CAPS Take 5,000 Units by mouth daily.    . DULoxetine (CYMBALTA) 30 MG capsule Take 30 mg by mouth 2 (two) times daily.    . hydrOXYzine (ATARAX/VISTARIL) 10 MG tablet Take 10 mg by mouth at bedtime.    . nitroGLYCERIN (NITROSTAT) 0.4 MG SL tablet Place 0.4 mg under the tongue every 5 (five) minutes as needed for chest pain.    . pantoprazole (PROTONIX) 20 MG tablet Take 20 mg by mouth daily.    . ramipril (ALTACE) 10 MG capsule Take 10 mg by mouth 2 (two) times daily.    . sertraline (ZOLOFT) 100 MG tablet Take 100 mg by mouth as needed.    . simvastatin (ZOCOR) 10 MG tablet Take 10 mg  by mouth daily.    . sodium bicarbonate 650 MG tablet Take 650 mg by mouth daily.     No current facility-administered medications for this visit.     Allergies:   Morphine; Sulfa antibiotics; Amoxicillin; Benzonatate; Buprenorphine; Ciprofloxacin; and Morphine and related   Social History:  The patient  reports that she has quit smoking. She has never used smokeless tobacco.   Family History:  The patient's family history includes Heart attack in her brother and father.    ROS:  Please see the history of present illness.   Otherwise, review of systems is positive for Weight loss, fatigue, sweats, chills, leg pain, leg swelling, hearing loss, visual changes, abdominal pain, diarrhea, nausea, back pain, muscle pain, joint swelling, balance problems, dizziness, easy bruising, bleeding.   All other systems  are reviewed and negative.    PHYSICAL EXAM: VS:  BP (!) 160/88   Pulse 94   Ht  (1.626 m)   Wt 141 lb 12.8 oz (64.3 kg)   SpO2 96%   BMI 24.34 kg/m  , BMI Body mass index is 24.34 kg/m. GEN: Well nourished, well developed, in no acute distress  HEENT: normal  Neck: no JVD, carotid bruits, or masses Cardiac: RRR; no murmurs, rubs, or gallops,no edema  Respiratory:  clear to auscultation bilaterally, normal work of breathing GI: soft, nontender, nondistended, + BS MS: no deformity or atrophy  Skin: warm and dry,  device pocket is well healed Neuro:  Strength and sensation are intact Psych: euthymic mood, full affect  EKG:  EKG is ordered today. Personal review of the ekg ordered 07/09/16 shows sinus rhythm, left anterior fascicular block, anterior Q waves, LVH   Device interrogation is reviewed today in detail.  See PaceArt for details.   Recent Labs: 05/30/2016: ALT 27; BUN 20; Creatinine, Ser 1.00; Hemoglobin 13.5; Platelets 201; Potassium 4.5; Sodium 143    Lipid Panel  No results found for: CHOL, TRIG, HDL, CHOLHDL, VLDL, LDLCALC, LDLDIRECT   Wt Readings from Last 3 Encounters:  07/17/16 141 lb 12.8 oz (64.3 kg)  07/09/16 143 lb 3.2 oz (65 kg)  05/30/16 140 lb (63.5 kg)      Other studies Reviewed: Additional studies/ records that were reviewed today include: Epic notes  ASSESSMENT AND PLAN:  1.  Sinus node dysfunction: s/p St. Jude dual chamber pacemaker. Pacemaker functioning appropriately. No changes made to the pacemaker today. She has 10 years left on her battery. We'll have her follow-up in pacemaker clinic in 3 months to discuss remote monitoring.  2. Hypertension: Pressure is significantly elevated today. Per the patient her blood pressure is usually in the 150s, but has been as high as the 180s systolic. She did have a few episodes of a one-to-one atrial tachycardia on her monitor. We'll add 12.5 mg of carvedilol. Other blood pressure medication  adjustments per PCP.  Current medicines are reviewed at length with the patient today.   The patient does not have concerns regarding her medicines.  The following changes were made today:  none  Labs/ tests ordered today include:  No orders of the defined types were placed in this encounter.    Disposition:   FU with Foster Sonnier 1 year  Signed, Arlys Scatena Jorja Loa, MD  07/17/2016 11:50 AM     [facility] [address] [place] [address] [phone] (office) [phone] (fax)

## 2016-07-18 LAB — CUP PACEART INCLINIC DEVICE CHECK
Brady Statistic RV Percent Paced: 0.09 %
Date Time Interrogation Session: 20180412152924
Implantable Lead Implant Date: 20160108
Implantable Lead Location: 753862
Implantable Pulse Generator Implant Date: 20160108
Lead Channel Impedance Value: 587.5 Ohm
Lead Channel Pacing Threshold Amplitude: 0.5 V
Lead Channel Pacing Threshold Amplitude: 0.75 V
Lead Channel Sensing Intrinsic Amplitude: 12 mV
Lead Channel Setting Pacing Pulse Width: 0.4 ms
Lead Channel Setting Sensing Sensitivity: 2 mV
MDC IDC LEAD IMPLANT DT: 20160108
MDC IDC LEAD LOCATION: 753862
MDC IDC MSMT BATTERY VOLTAGE: 3.01 V
MDC IDC MSMT LEADCHNL RA IMPEDANCE VALUE: 412.5 Ohm
MDC IDC MSMT LEADCHNL RA PACING THRESHOLD PULSEWIDTH: 0.4 ms
MDC IDC MSMT LEADCHNL RA SENSING INTR AMPL: 5 mV
MDC IDC MSMT LEADCHNL RV PACING THRESHOLD PULSEWIDTH: 0.4 ms
MDC IDC PG SERIAL: 7661751
MDC IDC SET LEADCHNL RA PACING AMPLITUDE: 1.5 V
MDC IDC SET LEADCHNL RV PACING AMPLITUDE: 1 V
MDC IDC STAT BRADY RA PERCENT PACED: 2.4 %
Pulse Gen Model: 2240

## 2016-07-23 ENCOUNTER — Ambulatory Visit
Admission: RE | Admit: 2016-07-23 | Discharge: 2016-07-23 | Disposition: A | Discharge status: Home / Self Care | Payer: PPO | Source: Ambulatory Visit | Attending: Nephrology | Admitting: Nephrology

## 2016-07-23 DIAGNOSIS — I129 Hypertensive chronic kidney disease with stage 1 through stage 4 chronic kidney disease, or unspecified chronic kidney disease: Secondary | ICD-10-CM

## 2016-07-23 DIAGNOSIS — N183 Chronic kidney disease, stage 3 unspecified: Secondary | ICD-10-CM

## 2016-07-23 DIAGNOSIS — N281 Cyst of kidney, acquired: Secondary | ICD-10-CM | POA: Diagnosis not present

## 2016-08-01 DIAGNOSIS — K219 Gastro-esophageal reflux disease without esophagitis: Secondary | ICD-10-CM | POA: Diagnosis not present

## 2016-08-01 DIAGNOSIS — I10 Essential (primary) hypertension: Secondary | ICD-10-CM | POA: Diagnosis not present

## 2016-08-01 DIAGNOSIS — M25511 Pain in right shoulder: Secondary | ICD-10-CM | POA: Diagnosis not present

## 2016-08-01 DIAGNOSIS — S39012A Strain of muscle, fascia and tendon of lower back, initial encounter: Secondary | ICD-10-CM | POA: Diagnosis not present

## 2016-08-01 DIAGNOSIS — M25512 Pain in left shoulder: Secondary | ICD-10-CM | POA: Diagnosis not present

## 2016-08-02 ENCOUNTER — Emergency Department (HOSPITAL_COMMUNITY)
Admission: EM | Admit: 2016-08-02 | Discharge: 2016-08-02 | Disposition: A | Discharge status: Home / Self Care | Payer: PPO | Attending: Emergency Medicine | Admitting: Emergency Medicine

## 2016-08-02 ENCOUNTER — Emergency Department (HOSPITAL_COMMUNITY): Payer: PPO

## 2016-08-02 DIAGNOSIS — M25559 Pain in unspecified hip: Secondary | ICD-10-CM | POA: Diagnosis not present

## 2016-08-02 DIAGNOSIS — M25511 Pain in right shoulder: Secondary | ICD-10-CM | POA: Insufficient documentation

## 2016-08-02 DIAGNOSIS — M545 Low back pain, unspecified: Secondary | ICD-10-CM

## 2016-08-02 DIAGNOSIS — Z79899 Other long term (current) drug therapy: Secondary | ICD-10-CM | POA: Diagnosis not present

## 2016-08-02 DIAGNOSIS — Y929 Unspecified place or not applicable: Secondary | ICD-10-CM | POA: Insufficient documentation

## 2016-08-02 DIAGNOSIS — Z7982 Long term (current) use of aspirin: Secondary | ICD-10-CM | POA: Diagnosis not present

## 2016-08-02 DIAGNOSIS — R531 Weakness: Secondary | ICD-10-CM | POA: Diagnosis not present

## 2016-08-02 DIAGNOSIS — Y999 Unspecified external cause status: Secondary | ICD-10-CM | POA: Diagnosis not present

## 2016-08-02 DIAGNOSIS — G8929 Other chronic pain: Secondary | ICD-10-CM | POA: Diagnosis not present

## 2016-08-02 DIAGNOSIS — Y939 Activity, unspecified: Secondary | ICD-10-CM | POA: Diagnosis not present

## 2016-08-02 DIAGNOSIS — R404 Transient alteration of awareness: Secondary | ICD-10-CM | POA: Diagnosis not present

## 2016-08-02 DIAGNOSIS — I10 Essential (primary) hypertension: Secondary | ICD-10-CM | POA: Insufficient documentation

## 2016-08-02 DIAGNOSIS — W010XXA Fall on same level from slipping, tripping and stumbling without subsequent striking against object, initial encounter: Secondary | ICD-10-CM | POA: Diagnosis not present

## 2016-08-02 LAB — CBC
HCT: 37.3 % (ref 36.0–46.0)
Hemoglobin: 12.7 g/dL (ref 12.0–15.0)
MCH: 30.7 pg (ref 26.0–34.0)
MCHC: 34 g/dL (ref 30.0–36.0)
MCV: 90.1 fL (ref 78.0–100.0)
PLATELETS: 223 10*3/uL (ref 150–400)
RBC: 4.14 MIL/uL (ref 3.87–5.11)
RDW: 13.3 % (ref 11.5–15.5)
WBC: 7.8 10*3/uL (ref 4.0–10.5)

## 2016-08-02 LAB — BASIC METABOLIC PANEL
Anion gap: 8 (ref 5–15)
BUN: 18 mg/dL (ref 6–20)
CALCIUM: 9.5 mg/dL (ref 8.9–10.3)
CO2: 25 mmol/L (ref 22–32)
Chloride: 107 mmol/L (ref 101–111)
Creatinine, Ser: 1.05 mg/dL — ABNORMAL HIGH (ref 0.44–1.00)
GFR, EST AFRICAN AMERICAN: 57 mL/min — AB (ref >60)
GFR, EST NON AFRICAN AMERICAN: 49 mL/min — AB (ref >60)
Glucose, Bld: 186 mg/dL — ABNORMAL HIGH (ref 65–99)
POTASSIUM: 4.3 mmol/L (ref 3.5–5.1)
SODIUM: 140 mmol/L (ref 135–145)

## 2016-08-02 NOTE — ED Notes (Signed)
LOWER STOMACH PAIN FOR SEVERAL DAYS. DIARRHEA OR LOOSE STOOL SINCE SHE ALMOST FELL. PT HAS BEEN TOLD SHE MAY BE ? PROLAPSED INSIDE.

## 2016-08-02 NOTE — Discharge Instructions (Signed)
It was our pleasure to provide your ER care today - we hope that you feel better.  Your xrays show no acute fracture.  Rest. Avoid heavy lifting/strenuous lifting, or bending at waist, for the next week.  You may take acetaminophen and ibuprofen as need for pain.    Follow up with primary care doctor, and orthopedic doctor in the next couple weeks.   Return to ER if worse, new symptoms, fevers, weak/faint, other concern.

## 2016-08-02 NOTE — ED Notes (Signed)
ED Provider at bedside. 

## 2016-08-02 NOTE — ED Notes (Addendum)
PT STATES SHE WAS SEEN BY PCP SEVERAL WEEKS AGO FOR RIGHT SHOULDER PAIN. PCP GAVE SHOTS TO EACH SHOULDER. PT C/O OF LOWER BACK PAIN AND TAILBONE PAIN. "SPASMS" FROM CLOSE FALL SEVERAL WEEKS AGO.  PT STATES SHE TOOK "A BUNCH OF SLEEP MEDICATION" NEVER WENT TO SLEEP HOWEVER SHE BELIEVES SHE FELL DURING THE NIGHT AND GOT BACK INTO THE BED. PT C/O OF RT SHOULDER PAIN. BRUISING AND BULGE NOTED AT UPPER RT ARM. FAMILY AND PT STATES NOT PREVIOUS.

## 2016-08-02 NOTE — ED Notes (Signed)
Bed: WA09 Expected date:  Expected time:  Means of arrival:  Comments: 

## 2016-08-02 NOTE — ED Provider Notes (Signed)
[facility]-EMERGENCY DEPT Provider Note   CSN: [id number] Arrival date & time: 08/02/16  1028     History   Chief Complaint Chief Complaint  Patient presents with  . Shoulder Pain    RIGHT SIDE  . Shoulder Injury  . Back Pain    LUMBAR  . Fall    HPI Monica Choi is a 82 y.o. female.  Patient c/o right shoulder pain. States had recent trip and fall, landed on bottom, and states increased right shoulder pain since. Notes hx bilateral shoulder pain, w prior injections for pain. Patient denies any syncope, or faintness/dizziness prior to fall. No head injury or headache. No neck or back pain. Denies numbness/weakness. No fever or chills. Has been eating and drinking normally.    The history is provided by the patient.  Shoulder Pain    Shoulder Injury  Pertinent negatives include no chest pain, no headaches and no shortness of breath.  Back Pain   Pertinent negatives include no chest pain, no fever, no headaches and no dysuria.  Fall  Pertinent negatives include no chest pain, no headaches and no shortness of breath.  Abdominal Pain   Pertinent negatives include fever, vomiting, dysuria and headaches.    Past Medical History:  Diagnosis Date  . Anemia   . Anxiety   . Arrhythmia   . Diverticulitis   . GERD (gastroesophageal reflux disease)   . Hypertension   . Rectal prolapse   . Renal disorder   . Sinus node dysfunction [facility])     Patient Active Problem List   Diagnosis Date Noted  . Hypertension 07/17/2016  . Sick sinus syndrome (HCC) 07/17/2016    Past Surgical History:  Procedure Laterality Date  . ABDOMINAL HYSTERECTOMY    . ABDOMINAL SURGERY    . CARDIAC SURGERY    . CATARACT EXTRACTION Bilateral   . CHOLECYSTECTOMY    . COLON SURGERY    . HERNIA REPAIR    . KNEE SURGERY    . NASAL SINUS SURGERY      OB History    No data available       Home Medications    Prior to Admission medications   Medication Sig Start Date End Date Taking?  Authorizing Provider  acetaminophen (ACETAMINOPHEN 8 HOUR) 650 MG CR tablet Take 1,300 mg by mouth every 8 (eight) hours as needed for pain.     Historical Provider, MD  allopurinol (ZYLOPRIM) 100 MG tablet Take 1 tablet by mouth daily. 06/13/16 06/13/17  Historical Provider, MD  amLODipine (NORVASC) 10 MG tablet Take 10 mg by mouth daily. 04/08/16   Historical Provider, MD  aspirin 81 MG chewable tablet Chew 81 mg by mouth daily.    Historical Provider, MD  BIOTIN 5000 PO Take 5,000 mg by mouth daily.    Historical Provider, MD  bismuth subsalicylate (PEPTO BISMOL) 262 MG chewable tablet Chew 2 tablets by mouth as needed.    Historical Provider, MD  carvedilol (COREG) 12.5 MG tablet Take 1 tablet (12.5 mg total) by mouth 2 (two) times daily. 07/17/16   Will Jorja Loa, MD  cetirizine (ZYRTEC) 10 MG tablet Take 10 mg by mouth daily.    Historical Provider, MD  Cholecalciferol (VITAMIN D) 2000 units CAPS Take 5,000 Units by mouth daily.    Historical Provider, MD  DULoxetine (CYMBALTA) 30 MG capsule Take 30 mg by mouth 2 (two) times daily.    Historical Provider, MD  hydrOXYzine (ATARAX/VISTARIL) 10 MG tablet Take 10 mg by mouth  at bedtime.    Historical Provider, MD  nitroGLYCERIN (NITROSTAT) 0.4 MG SL tablet Place 0.4 mg under the tongue every 5 (five) minutes as needed for chest pain.    Historical Provider, MD  pantoprazole (PROTONIX) 20 MG tablet Take 20 mg by mouth daily. 04/08/16   Historical Provider, MD  ramipril (ALTACE) 10 MG capsule Take 10 mg by mouth 2 (two) times daily.    Historical Provider, MD  sertraline (ZOLOFT) 100 MG tablet Take 100 mg by mouth as needed.    Historical Provider, MD  simvastatin (ZOCOR) 10 MG tablet Take 10 mg by mouth daily.    Historical Provider, MD  sodium bicarbonate 650 MG tablet Take 650 mg by mouth daily. 03/27/16   Historical Provider, MD    Family History Family History  Problem Relation Age of Onset  . Heart attack Father   . Heart attack Brother      Social History Social History  Substance Use Topics  . Smoking status: Former Games developer  . Smokeless tobacco: Never Used  . Alcohol use Not on file     Allergies   Morphine; Sulfa antibiotics; Amoxicillin; Benzonatate; Buprenorphine; Ciprofloxacin; and Morphine and related   Review of Systems Review of Systems  Constitutional: Negative for chills and fever.  HENT: Negative for sore throat.   Eyes: Negative for visual disturbance.  Respiratory: Negative for cough and shortness of breath.   Cardiovascular: Negative for chest pain.  Gastrointestinal: Negative for vomiting.  Genitourinary: Negative for dysuria.  Musculoskeletal: Negative for neck pain.  Skin: Negative for wound.  Neurological: Negative for headaches.  Hematological: Does not bruise/bleed easily.  Psychiatric/Behavioral: Negative for confusion.     Physical Exam Updated Vital Signs BP (!) 180/80 (BP Location: Left Arm)   Pulse 64   Temp 98.6 F (37 C) (Oral)   Resp 16   Ht  (1.626 m)   Wt 64 kg   SpO2 99%   BMI 24.20 kg/m   Physical Exam  Constitutional: She appears well-developed and well-nourished. No distress.  HENT:  Head: Atraumatic.  Mouth/Throat: Oropharynx is clear and moist.  Eyes: Conjunctivae are normal. Pupils are equal, round, and reactive to light. No scleral icterus.  Neck: Neck supple. No tracheal deviation present.  Cardiovascular: Normal rate, regular rhythm, normal heart sounds and intact distal pulses.   Pulmonary/Chest: Effort normal and breath sounds normal. No respiratory distress.  Abdominal: Soft. Normal appearance and bowel sounds are normal. She exhibits no distension. There is no tenderness. There is no guarding.  Genitourinary:  Genitourinary Comments: No cva tenderness  Musculoskeletal: She exhibits no edema.  Neurological: She is alert.  Alert, speech clear fluent. Motor intact bil, stre 5/5. sens grossly intact.   Skin: Skin is warm and dry. No rash noted.   Psychiatric: She has a normal mood and affect.  Nursing note and vitals reviewed.    ED Treatments / Results  Labs (all labs ordered are listed, but only abnormal results are displayed) Results for orders placed or performed during the hospital encounter of 08/02/16  CBC  Result Value Ref Range   WBC 7.8 4.0 - 10.5 K/uL   RBC 4.14 3.87 - 5.11 MIL/uL   Hemoglobin 12.7 12.0 - 15.0 g/dL   HCT 16.1 09.6 - 04.5 %   MCV 90.1 78.0 - 100.0 fL   MCH 30.7 26.0 - 34.0 pg   MCHC 34.0 30.0 - 36.0 g/dL   RDW 40.9 81.1 - 91.4 %   Platelets 223  150 - 400 K/uL  Basic metabolic panel  Result Value Ref Range   Sodium 140 135 - 145 mmol/L   Potassium 4.3 3.5 - 5.1 mmol/L   Chloride 107 101 - 111 mmol/L   CO2 25 22 - 32 mmol/L   Glucose, Bld 186 (H) 65 - 99 mg/dL   BUN 18 6 - 20 mg/dL   Creatinine, Ser 6.04 (H) 0.44 - 1.00 mg/dL   Calcium 9.5 8.9 - 54.0 mg/dL   GFR calc non Af Amer 49 (L) >60 mL/min   GFR calc Af Amer 57 (L) >60 mL/min   Anion gap 8 5 - 15   Dg Pelvis 1-2 Views  Result Date: 08/02/2016 CLINICAL DATA:  Pain.  Questionable history of trauma EXAM: PELVIS - 1-2 VIEW COMPARISON:  None. FINDINGS: There is no evidence of pelvic fracture or dislocation. Hip joints appear symmetric and unremarkable bilaterally. There are calcifications in the soft tissues buttocks regions, probably granulomas. IMPRESSION: No appreciable fracture or dislocation. Hip joint spaces appear symmetric and unremarkable. Electronically Signed   By: Bretta Bang III M.D.   On: 08/02/2016 12:27   Dg Shoulder Right  Result Date: 08/02/2016 CLINICAL DATA:  Pain with questionable fall EXAM: RIGHT SHOULDER - 2+ VIEW COMPARISON:  None. FINDINGS: Frontal, Y scapular, and axillary images were obtained. There is moderate generalized osteoarthritic change. No acute fracture or dislocation evident. Calcification inferior to the glenoid is probably of arthropathic etiology, although this calcification could represent  residua of old trauma. Visualized right lung is clear. There is right carotid artery calcification. IMPRESSION: Moderate generalized osteoarthritic change. No acute fracture or dislocation evident. Calcification inferior to the glenoid is probably of arthropathic etiology but could potentially represent residua of prior trauma. There is right carotid artery calcification. Electronically Signed   By: Bretta Bang III M.D.   On: 08/02/2016 12:26       EKG  EKG Interpretation None       Radiology No results found.  Procedures Procedures (including critical care time)  Medications Ordered in ED Medications - No data to display   Initial Impression / Assessment and Plan / ED Course  I have reviewed the triage vital signs and the nursing notes.  Pertinent labs & imaging results that were available during my care of the patient were reviewed by me and considered in my medical decision making (see chart for details).  Labs. xrays.  Reviewed nursing notes and prior charts for additional history.   xrays neg acute.  rec pcp and ortho f/u.    Final Clinical Impressions(s) / ED Diagnoses   Final diagnoses:  None    New Prescriptions New Prescriptions   No medications on file     Cathren Laine, MD 08/02/16 1405

## 2016-08-02 NOTE — ED Triage Notes (Signed)
Per [facility]- Pt resides at home. FULL CODE. Pt denies injury however heavy lifting yesterday. Pt c/o of right side shoulder pain. Swelling without deformity noted on EMS assessment. Increased pain with lifting. Present sensation. Pulses present. Lumbar back pain present for several weeks. Seen PCP however continues. Denies any injury. Family wants evaluation. Pt denies CP or SOB. Pt denies any pain with rest

## 2016-08-13 DIAGNOSIS — M25512 Pain in left shoulder: Secondary | ICD-10-CM | POA: Diagnosis not present

## 2016-08-13 DIAGNOSIS — M25511 Pain in right shoulder: Secondary | ICD-10-CM | POA: Diagnosis not present

## 2016-08-13 DIAGNOSIS — F3340 Major depressive disorder, recurrent, in remission, unspecified: Secondary | ICD-10-CM | POA: Diagnosis not present

## 2016-08-13 DIAGNOSIS — R413 Other amnesia: Secondary | ICD-10-CM | POA: Diagnosis not present

## 2016-08-13 DIAGNOSIS — I10 Essential (primary) hypertension: Secondary | ICD-10-CM | POA: Diagnosis not present

## 2016-08-21 DIAGNOSIS — Z961 Presence of intraocular lens: Secondary | ICD-10-CM | POA: Diagnosis not present

## 2016-08-21 DIAGNOSIS — D692 Other nonthrombocytopenic purpura: Secondary | ICD-10-CM | POA: Diagnosis not present

## 2016-08-21 DIAGNOSIS — H5201 Hypermetropia, right eye: Secondary | ICD-10-CM | POA: Diagnosis not present

## 2016-08-21 DIAGNOSIS — H524 Presbyopia: Secondary | ICD-10-CM | POA: Diagnosis not present

## 2016-08-21 DIAGNOSIS — H52223 Regular astigmatism, bilateral: Secondary | ICD-10-CM | POA: Diagnosis not present

## 2016-08-21 DIAGNOSIS — L281 Prurigo nodularis: Secondary | ICD-10-CM | POA: Diagnosis not present

## 2016-08-21 DIAGNOSIS — H40053 Ocular hypertension, bilateral: Secondary | ICD-10-CM | POA: Diagnosis not present

## 2016-08-26 DIAGNOSIS — G8929 Other chronic pain: Secondary | ICD-10-CM | POA: Diagnosis not present

## 2016-08-26 DIAGNOSIS — S40011A Contusion of right shoulder, initial encounter: Secondary | ICD-10-CM | POA: Diagnosis not present

## 2016-08-26 DIAGNOSIS — M25511 Pain in right shoulder: Secondary | ICD-10-CM | POA: Diagnosis not present

## 2016-08-26 DIAGNOSIS — M25512 Pain in left shoulder: Secondary | ICD-10-CM | POA: Diagnosis not present

## 2016-08-30 ENCOUNTER — Emergency Department (HOSPITAL_COMMUNITY): Payer: PPO

## 2016-08-30 ENCOUNTER — Encounter (HOSPITAL_COMMUNITY): Payer: Self-pay | Admitting: Obstetrics and Gynecology

## 2016-08-30 ENCOUNTER — Emergency Department (HOSPITAL_COMMUNITY)
Admission: EM | Admit: 2016-08-30 | Discharge: 2016-08-30 | Disposition: A | Discharge status: Home / Self Care | Payer: PPO | Attending: Emergency Medicine | Admitting: Emergency Medicine

## 2016-08-30 DIAGNOSIS — F0390 Unspecified dementia without behavioral disturbance: Secondary | ICD-10-CM | POA: Diagnosis not present

## 2016-08-30 DIAGNOSIS — G43809 Other migraine, not intractable, without status migrainosus: Secondary | ICD-10-CM | POA: Diagnosis not present

## 2016-08-30 DIAGNOSIS — R109 Unspecified abdominal pain: Secondary | ICD-10-CM | POA: Insufficient documentation

## 2016-08-30 DIAGNOSIS — I10 Essential (primary) hypertension: Secondary | ICD-10-CM | POA: Diagnosis not present

## 2016-08-30 DIAGNOSIS — R197 Diarrhea, unspecified: Secondary | ICD-10-CM | POA: Diagnosis not present

## 2016-08-30 DIAGNOSIS — R1084 Generalized abdominal pain: Secondary | ICD-10-CM | POA: Diagnosis not present

## 2016-08-30 DIAGNOSIS — R51 Headache: Secondary | ICD-10-CM

## 2016-08-30 DIAGNOSIS — R110 Nausea: Secondary | ICD-10-CM | POA: Diagnosis not present

## 2016-08-30 DIAGNOSIS — Z79899 Other long term (current) drug therapy: Secondary | ICD-10-CM | POA: Insufficient documentation

## 2016-08-30 DIAGNOSIS — Z87891 Personal history of nicotine dependence: Secondary | ICD-10-CM | POA: Insufficient documentation

## 2016-08-30 DIAGNOSIS — Z7982 Long term (current) use of aspirin: Secondary | ICD-10-CM | POA: Insufficient documentation

## 2016-08-30 DIAGNOSIS — R030 Elevated blood-pressure reading, without diagnosis of hypertension: Secondary | ICD-10-CM | POA: Diagnosis not present

## 2016-08-30 DIAGNOSIS — R509 Fever, unspecified: Secondary | ICD-10-CM | POA: Diagnosis not present

## 2016-08-30 DIAGNOSIS — R519 Headache, unspecified: Secondary | ICD-10-CM

## 2016-08-30 LAB — COMPREHENSIVE METABOLIC PANEL
ALT: 25 U/L (ref 14–54)
AST: 35 U/L (ref 15–41)
Albumin: 4.1 g/dL (ref 3.5–5.0)
Alkaline Phosphatase: 87 U/L (ref 38–126)
Anion gap: 9 (ref 5–15)
BILIRUBIN TOTAL: 0.9 mg/dL (ref 0.3–1.2)
BUN: 19 mg/dL (ref 6–20)
CHLORIDE: 105 mmol/L (ref 101–111)
CO2: 26 mmol/L (ref 22–32)
CREATININE: 0.97 mg/dL (ref 0.44–1.00)
Calcium: 9.9 mg/dL (ref 8.9–10.3)
GFR calc Af Amer: >60 mL/min (ref >60)
GFR, EST NON AFRICAN AMERICAN: 54 mL/min — AB (ref >60)
GLUCOSE: 188 mg/dL — AB (ref 65–99)
Potassium: 3.5 mmol/L (ref 3.5–5.1)
Sodium: 140 mmol/L (ref 135–145)
Total Protein: 7.5 g/dL (ref 6.5–8.1)

## 2016-08-30 LAB — SEDIMENTATION RATE: SED RATE: 46 mm/h — AB (ref 0–22)

## 2016-08-30 LAB — LIPASE, BLOOD: LIPASE: 18 U/L (ref 11–51)

## 2016-08-30 LAB — URINALYSIS, ROUTINE W REFLEX MICROSCOPIC
Bacteria, UA: NONE SEEN
Bilirubin Urine: NEGATIVE
GLUCOSE, UA: NEGATIVE mg/dL
Hgb urine dipstick: NEGATIVE
Ketones, ur: NEGATIVE mg/dL
Leukocytes, UA: NEGATIVE
Nitrite: NEGATIVE
PH: 8 (ref 5.0–8.0)
PROTEIN: 100 mg/dL — AB
Specific Gravity, Urine: 1.01 (ref 1.005–1.030)

## 2016-08-30 LAB — CBC WITH DIFFERENTIAL/PLATELET
Basophils Absolute: 0 10*3/uL (ref 0.0–0.1)
Basophils Relative: 0 %
EOS ABS: 0 10*3/uL (ref 0.0–0.7)
EOS PCT: 0 %
HCT: 39.5 % (ref 36.0–46.0)
Hemoglobin: 13.5 g/dL (ref 12.0–15.0)
Lymphocytes Relative: 23 %
Lymphs Abs: 2.5 10*3/uL (ref 0.7–4.0)
MCH: 31.5 pg (ref 26.0–34.0)
MCHC: 34.2 g/dL (ref 30.0–36.0)
MCV: 92.1 fL (ref 78.0–100.0)
MONO ABS: 0.5 10*3/uL (ref 0.1–1.0)
Monocytes Relative: 4 %
Neutro Abs: 7.8 10*3/uL — ABNORMAL HIGH (ref 1.7–7.7)
Neutrophils Relative %: 73 %
PLATELETS: 249 10*3/uL (ref 150–400)
RBC: 4.29 MIL/uL (ref 3.87–5.11)
RDW: 14.1 % (ref 11.5–15.5)
WBC: 10.8 10*3/uL — AB (ref 4.0–10.5)

## 2016-08-30 LAB — I-STAT TROPONIN, ED: Troponin i, poc: 0 ng/mL (ref 0.00–0.08)

## 2016-08-30 LAB — POC OCCULT BLOOD, ED: FECAL OCCULT BLD: NEGATIVE

## 2016-08-30 MED ORDER — FENTANYL CITRATE (PF) 100 MCG/2ML IJ SOLN
100.0000 ug | Freq: Once | INTRAMUSCULAR | Status: AC
  Administered 2016-08-30: 100 ug via INTRAVENOUS
  Filled 2016-08-30: qty 2

## 2016-08-30 MED ORDER — PREDNISONE 20 MG PO TABS: ORAL_TABLET | ORAL | 0 refills | Status: DC

## 2016-08-30 MED ORDER — PREDNISONE 20 MG PO TABS
40.0000 mg | ORAL_TABLET | Freq: Once | ORAL | Status: AC
  Administered 2016-08-30: 40 mg via ORAL
  Filled 2016-08-30: qty 2

## 2016-08-30 MED ORDER — METOCLOPRAMIDE HCL 10 MG PO TABS: 5.0000 mg | ORAL_TABLET | Freq: Four times a day (QID) | ORAL | 0 refills | Status: DC | PRN

## 2016-08-30 MED ORDER — IOPAMIDOL (ISOVUE-300) INJECTION 61%
INTRAVENOUS | Status: AC
  Administered 2016-08-30: 100 mL via INTRAVENOUS
  Filled 2016-08-30: qty 100

## 2016-08-30 MED ORDER — METOCLOPRAMIDE HCL 5 MG/ML IJ SOLN
5.0000 mg | Freq: Once | INTRAMUSCULAR | Status: AC
  Administered 2016-08-30: 5 mg via INTRAVENOUS
  Filled 2016-08-30: qty 2

## 2016-08-30 NOTE — ED Notes (Signed)
rn at bedside starting iv will collect labs 

## 2016-08-30 NOTE — Discharge Instructions (Signed)
[name] has an elevated sedimentation rate today at 46. Call Dr. Leonie Choi's office in 3 days to arrange for the next available appointment. Tell office staff that you were seen here and that she had CT scans performed here as well as other tests. We feel that [name] should have a temporal artery biopsy arranged by Dr. Earnest Choi to check for temporal arteritis. She should take the prednisone as prescribed. If she needs refills, ask Dr. Earnest Choi for a prescription. She can also take the medication prescribed as needed for nausea or headaches .Her blood pressure should be rechecked within a week. [name] should take her blood pressure medication as prescribed. Return if concern for any reason

## 2016-08-30 NOTE — ED Notes (Signed)
In and Out Cath not completed per prior charting due to obtaining a clean catch urine sample.

## 2016-08-30 NOTE — ED Provider Notes (Addendum)
[facility]-EMERGENCY DEPT Provider Note   CSN: [id number] Arrival date & time: 08/30/16  1449     History   Chief Complaint Chief Complaint  Patient presents with  . Nausea   Level V caveat dementia history is obtained from patient's son in law and daughter HPI Monica Choi is a 83 y.o. female.Complains of bitemporal headache and diffuse abdominal pain onset yesterday evening. Her son in law reports that he spoke with her yesterday at 6:30 PM and she was "fine" associated symptoms include nausea, no vomiting. No other associated symptoms. No treatment prior to coming here Patient reports his mother was very sleepy this morning which happens with some frequency over recent weeks. She is more awake and alert presently than when he first saw her earlier today. Other associated symptoms include subjective fever reported by patient and diarrhea though her son in law states that sometimes she calls diarrhea "loose stools." HPI  Past Medical History:  Diagnosis Date  . Anemia   . Anxiety   . Arrhythmia   . Diverticulitis   . GERD (gastroesophageal reflux disease)   . Hypertension   . Rectal prolapse   . Renal disorder   . Sinus node dysfunction (HCC)    Dementia Patient Active Problem List   Diagnosis Date Noted  . Hypertension 07/17/2016  . Sick sinus syndrome (HCC) 07/17/2016    Past Surgical History:  Procedure Laterality Date  . ABDOMINAL HYSTERECTOMY    . ABDOMINAL SURGERY    . CARDIAC SURGERY    . CATARACT EXTRACTION Bilateral   . CHOLECYSTECTOMY    . COLON SURGERY    . HERNIA REPAIR    . KNEE SURGERY    . NASAL SINUS SURGERY      OB History    No data available       Home Medications    Prior to Admission medications   Medication Sig Start Date End Date Taking? Authorizing Provider  acetaminophen (ACETAMINOPHEN 8 HOUR) 650 MG CR tablet Take 1,300 mg by mouth every 8 (eight) hours as needed for pain.     [provider]  allopurinol (ZYLOPRIM) 100 MG  tablet Take 1 tablet by mouth daily. 06/13/16 06/13/17  [provider]  amLODipine (NORVASC) 10 MG tablet Take 10 mg by mouth daily. 04/08/16   [provider]  aspirin 81 MG chewable tablet Chew 81 mg by mouth daily.    [provider]  BIOTIN 5000 PO Take 5,000 mg by mouth daily.    [provider]  bismuth subsalicylate (PEPTO BISMOL) 262 MG chewable tablet Chew 2 tablets by mouth as needed.    [provider]  carvedilol (COREG) 12.5 MG tablet Take 1 tablet (12.5 mg total) by mouth 2 (two) times daily. 07/17/16   Camnitz, Andree Coss, MD  cetirizine (ZYRTEC) 10 MG tablet Take 10 mg by mouth daily.    [provider]  Cholecalciferol (VITAMIN D) 2000 units CAPS Take 5,000 Units by mouth daily.    [provider]  DULoxetine (CYMBALTA) 30 MG capsule Take 30 mg by mouth 2 (two) times daily.    [provider]  hydrOXYzine (ATARAX/VISTARIL) 10 MG tablet Take 10 mg by mouth at bedtime.    [provider]  nitroGLYCERIN (NITROSTAT) 0.4 MG SL tablet Place 0.4 mg under the tongue every 5 (five) minutes as needed for chest pain.    [provider]  pantoprazole (PROTONIX) 20 MG tablet Take 20 mg by mouth daily. 04/08/16  [provider]  ramipril (ALTACE) 10 MG capsule Take 10 mg by mouth 2 (two) times daily.    [provider]  sertraline (ZOLOFT) 100 MG tablet Take 100 mg by mouth as needed.    [provider]  simvastatin (ZOCOR) 10 MG tablet Take 10 mg by mouth daily.    [provider]  sodium bicarbonate 650 MG tablet Take 650 mg by mouth daily. 03/27/16   [provider]    Family History Family History  Problem Relation Age of Onset  . Heart attack Father   . Heart attack Brother     Social History Social History  Substance Use Topics  . Smoking status: Former Games developer  . Smokeless tobacco: Never Used  . Alcohol use Not on file     Allergies   Morphine;  Sulfa antibiotics; Amoxicillin; Benzonatate; Buprenorphine; Ciprofloxacin; and Morphine and related   Review of Systems Review of Systems  Unable to perform ROS: Dementia  Constitutional: Positive for fever.  Gastrointestinal: Positive for abdominal pain.  Neurological: Positive for headaches.     Physical Exam Updated Vital Signs BP (!) 182/96   Resp 16   Physical Exam  Constitutional:  Moderately ill-appearing  HENT:  Head: Normocephalic and atraumatic.  No facial asymmetry mucous membranes dry  Eyes: Conjunctivae and EOM are normal.  Neck: Neck supple. No tracheal deviation present. No thyromegaly present.  Cardiovascular: Normal rate and regular rhythm.   No murmur heard. Pulmonary/Chest: Effort normal and breath sounds normal.  Abdominal: Soft. Bowel sounds are normal. She exhibits no distension. There is tenderness.  Diffusely tender  Genitourinary:  Genitourinary Comments: Rectal external hemorrhoids, nonthrombosed. Normal tone. Nontender. Brown stool, no gross blood  Musculoskeletal: Normal range of motion. She exhibits no edema or tenderness.  Neurological: She is alert. Coordination normal.  Follow simple commands moves all extremities cranial nerves II through XII grossly intact  Skin: Skin is warm and dry. No rash noted.  Psychiatric: She has a normal mood and affect.  Nursing note and vitals reviewed.   7:30 PM resting comfortably after treatment with intravenous fentanyl and intravenous Reglan. Nausea has resolved Concern for temporal arteritis with elevated sedimentation rate. ED Treatments / Results  Labs (all labs ordered are listed, but only abnormal results are displayed) Labs Reviewed - No data to display  EKG  EKG Interpretation None      ED ECG REPORT   Date: 08/30/2016  Rate: 85  Rhythm: normal sinus rhythm  QRS Axis: left  Intervals: QT prolonged  ST/T Wave abnormalities: nonspecific T wave changes  Conduction  Disutrbances:nonspecific intraventricular conduction delay  Narrative Interpretation:   Old EKG Reviewed: changes noted T wave flattening in apical leads new over 07/13/2016 I have personally reviewed the EKG tracing and disagree with the computerized printout as noted. Rhythm is normal sinus rhythm as opposed to ectopic atrial rhythm Radiology No results found.  Procedures Procedures (including critical care time)  Medications Ordered in ED Medications - No data to display  Results for orders placed or performed during the hospital encounter of 08/30/16  Urinalysis, Routine w reflex microscopic  Result Value Ref Range   Color, Urine YELLOW YELLOW   APPearance CLEAR CLEAR   Specific Gravity, Urine 1.010 1.005 - 1.030   pH 8.0 5.0 - 8.0   Glucose, UA NEGATIVE NEGATIVE mg/dL   Hgb urine dipstick NEGATIVE NEGATIVE   Bilirubin Urine NEGATIVE NEGATIVE   Ketones, ur NEGATIVE NEGATIVE mg/dL   Protein, ur 409 (A)  NEGATIVE mg/dL   Nitrite NEGATIVE NEGATIVE   Leukocytes, UA NEGATIVE NEGATIVE   RBC / HPF 0-5 0 - 5 RBC/hpf   WBC, UA 0-5 0 - 5 WBC/hpf   Bacteria, UA NONE SEEN NONE SEEN   Squamous Epithelial / LPF 0-5 (A) NONE SEEN  Comprehensive metabolic panel  Result Value Ref Range   Sodium 140 135 - 145 mmol/L   Potassium 3.5 3.5 - 5.1 mmol/L   Chloride 105 101 - 111 mmol/L   CO2 26 22 - 32 mmol/L   Glucose, Bld 188 (H) 65 - 99 mg/dL   BUN 19 6 - 20 mg/dL   Creatinine, Ser 4.09 0.44 - 1.00 mg/dL   Calcium 9.9 8.9 - 81.1 mg/dL   Total Protein 7.5 6.5 - 8.1 g/dL   Albumin 4.1 3.5 - 5.0 g/dL   AST 35 15 - 41 U/L   ALT 25 14 - 54 U/L   Alkaline Phosphatase 87 38 - 126 U/L   Total Bilirubin 0.9 0.3 - 1.2 mg/dL   GFR calc non Af Amer 54 (L) >60 mL/min   GFR calc Af Amer >60 >60 mL/min   Anion gap 9 5 - 15  CBC with Differential/Platelet  Result Value Ref Range   WBC 10.8 (H) 4.0 - 10.5 K/uL   RBC 4.29 3.87 - 5.11 MIL/uL   Hemoglobin 13.5 12.0 - 15.0 g/dL   HCT 91.4 78.2 -  95.6 %   MCV 92.1 78.0 - 100.0 fL   MCH 31.5 26.0 - 34.0 pg   MCHC 34.2 30.0 - 36.0 g/dL   RDW 21.3 08.6 - 57.8 %   Platelets 249 150 - 400 K/uL   Neutrophils Relative % 73 %   Neutro Abs 7.8 (H) 1.7 - 7.7 K/uL   Lymphocytes Relative 23 %   Lymphs Abs 2.5 0.7 - 4.0 K/uL   Monocytes Relative 4 %   Monocytes Absolute 0.5 0.1 - 1.0 K/uL   Eosinophils Relative 0 %   Eosinophils Absolute 0.0 0.0 - 0.7 K/uL   Basophils Relative 0 %   Basophils Absolute 0.0 0.0 - 0.1 K/uL  Lipase, blood  Result Value Ref Range   Lipase 18 11 - 51 U/L  Sedimentation rate  Result Value Ref Range   Sed Rate 46 (H) 0 - 22 mm/hr  POC occult blood, ED  Result Value Ref Range   Fecal Occult Bld NEGATIVE NEGATIVE  I-stat troponin, ED  Result Value Ref Range   Troponin i, poc 0.00 0.00 - 0.08 ng/mL   Comment 3           Dg Pelvis 1-2 Views  Result Date: 08/02/2016 CLINICAL DATA:  Pain.  Questionable history of trauma EXAM: PELVIS - 1-2 VIEW COMPARISON:  None. FINDINGS: There is no evidence of pelvic fracture or dislocation. Hip joints appear symmetric and unremarkable bilaterally. There are calcifications in the soft tissues buttocks regions, probably granulomas. IMPRESSION: No appreciable fracture or dislocation. Hip joint spaces appear symmetric and unremarkable. Electronically Signed   By: Bretta Bang III M.D.   On: 08/02/2016 12:27   Dg Shoulder Right  Result Date: 08/02/2016 CLINICAL DATA:  Pain with questionable fall EXAM: RIGHT SHOULDER - 2+ VIEW COMPARISON:  None. FINDINGS: Frontal, Y scapular, and axillary images were obtained. There is moderate generalized osteoarthritic change. No acute fracture or dislocation evident. Calcification inferior to the glenoid is probably of arthropathic etiology, although this calcification could represent residua of old trauma. Visualized right lung is clear.  There is right carotid artery calcification. IMPRESSION: Moderate generalized osteoarthritic change. No  acute fracture or dislocation evident. Calcification inferior to the glenoid is probably of arthropathic etiology but could potentially represent residua of prior trauma. There is right carotid artery calcification. Electronically Signed   By: Bretta Bang III M.D.   On: 08/02/2016 12:26   Ct Head Wo Contrast  Result Date: 08/30/2016 CLINICAL DATA:  Headaches, nausea EXAM: CT HEAD WITHOUT CONTRAST TECHNIQUE: Contiguous axial images were obtained from the base of the skull through the vertex without intravenous contrast. COMPARISON:  None. FINDINGS: Brain: No evidence of acute infarction, hemorrhage, extra-axial collection, ventriculomegaly, or mass effect. Generalized cerebral atrophy. Periventricular white matter low attenuation likely secondary to microangiopathy. Vascular: Cerebrovascular atherosclerotic calcifications are noted. Skull: Negative for fracture or focal lesion. Sinuses/Orbits: Visualized portions of the orbits are unremarkable. Visualized portions of the paranasal sinuses and mastoid air cells are unremarkable. Other: None. IMPRESSION: No acute intracranial pathology. Electronically Signed   By: Elige Ko   On: 08/30/2016 18:39   Ct Abdomen Pelvis W Contrast  Result Date: 08/30/2016 CLINICAL DATA:  Nausea today, headache, altered level of consciousness. EXAM: CT ABDOMEN AND PELVIS WITH CONTRAST TECHNIQUE: Multidetector CT imaging of the abdomen and pelvis was performed using the standard protocol following bolus administration of intravenous contrast. CONTRAST:  100 cc ISOVUE-300 IOPAMIDOL (ISOVUE-300) INJECTION 61% COMPARISON:  None. FINDINGS: Study somewhat limited by patient motion artifact. Lower chest: No acute abnormality. Hepatobiliary: No focal liver abnormality is seen. Status post cholecystectomy. No biliary dilatation. Pancreas: Pancreas difficult to definitively characterize due to patient motion artifact. No obvious mass or inflammatory change. No obvious pancreatic duct  dilatation Spleen: Normal in size without focal abnormality. Adrenals/Urinary Tract: Adrenal glands appear unremarkable. Probable small cysts within the right kidney, difficult to definitively characterize due to the patient motion artifact. Kidneys otherwise unremarkable without suspicious mass, stone or hydronephrosis. No ureteral or bladder calculi identified. Stomach/Bowel: No dilated large or small bowel loops. Surgical bowel anastomosis within the sigmoid colon without obstruction or other complicating feature. No bowel wall thickening or evidence of bowel wall inflammation seen. Stomach is unremarkable. Appendix is not seen but there are no inflammatory changes about the cecum to suggest acute appendicitis. Vascular/Lymphatic: Heavy atherosclerotic changes of the normal caliber abdominal aorta. 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 No acute appearing vascular abnormality. No enlarged lymph nodes seen in the abdomen or pelvis. Reproductive: Status post hysterectomy. No adnexal mass or fluid collection. Other: No free fluid or abscess collections seen. No free intraperitoneal air. Musculoskeletal: Degenerative changes throughout the thoracolumbar spine, moderate in degree. No acute or suspicious osseous finding. Superficial soft tissues are unremarkable. IMPRESSION: 1. No acute findings within the abdomen or pelvis, with evaluation slightly limited by patient motion artifact. 2. Aortic atherosclerosis. 3. Additional chronic/incidental findings detailed above. Electronically Signed   By: Bary Richard M.D.   On: 08/30/2016 18:48   Initial Impression / Assessment and Plan / ED Course  I have reviewed the triage vital signs and the nursing notes.  Pertinent labs & imaging results that were available during my care of the patient were reviewed by me and considered in my medical decision making (see chart for  details).     Concern for temporal arteritis in light of elevated sedimentation rate and headache. Plan prescription prednisone. Follow-up with PMD to arrange for temporal artery biopsy. Encouraged to take blood pressure  medication upon arrival home today she had not taken it today Prescriptions Reglan, prednisone Final Clinical Impressions(s) / ED Diagnoses  Diagnosis #1 bitemporal headache Final diagnoses:  None  #2 generalized abdominal pain # #3 elevated blood pressure pressure  New Prescriptions New Prescriptions   No medications on file     Doug SouJacubowitz, Vernadette Stutsman, MD 08/30/16 1949    Doug SouJacubowitz, Tajay Muzzy, MD 08/31/16 [phone]

## 2016-08-30 NOTE — ED Triage Notes (Signed)
Pt coming from home. Pt woke up nauseated today.  206/114 BP HR 90 96% on RA  Pt was lethargic and nauseous this morning Pt did not take her BP medication this morning.

## 2016-08-30 NOTE — ED Notes (Signed)
Urine collection route documented incorrectly. Specimen collected by clean catch.

## 2016-09-15 ENCOUNTER — Encounter (HOSPITAL_COMMUNITY): Payer: Self-pay | Admitting: Emergency Medicine

## 2016-09-15 ENCOUNTER — Emergency Department (HOSPITAL_COMMUNITY)
Admission: EM | Admit: 2016-09-15 | Discharge: 2016-09-16 | Disposition: A | Discharge status: Home / Self Care | Payer: PPO | Attending: Emergency Medicine | Admitting: Emergency Medicine

## 2016-09-15 DIAGNOSIS — I10 Essential (primary) hypertension: Secondary | ICD-10-CM | POA: Diagnosis not present

## 2016-09-15 DIAGNOSIS — R110 Nausea: Secondary | ICD-10-CM | POA: Insufficient documentation

## 2016-09-15 DIAGNOSIS — R519 Headache, unspecified: Secondary | ICD-10-CM

## 2016-09-15 DIAGNOSIS — D649 Anemia, unspecified: Secondary | ICD-10-CM | POA: Insufficient documentation

## 2016-09-15 DIAGNOSIS — Z79899 Other long term (current) drug therapy: Secondary | ICD-10-CM | POA: Diagnosis not present

## 2016-09-15 DIAGNOSIS — R51 Headache: Secondary | ICD-10-CM | POA: Diagnosis not present

## 2016-09-15 DIAGNOSIS — Z87891 Personal history of nicotine dependence: Secondary | ICD-10-CM | POA: Insufficient documentation

## 2016-09-15 DIAGNOSIS — R1084 Generalized abdominal pain: Secondary | ICD-10-CM | POA: Diagnosis not present

## 2016-09-15 NOTE — ED Triage Notes (Addendum)
Pt report having nausea and abd pain that started tonight. Pt reports having swelling to abd also pt blood pressure has been increased today as well. Pt seen on 08/30/16 for similar complaint. Pt also reports generalized weakness.

## 2016-09-16 LAB — COMPREHENSIVE METABOLIC PANEL
ALBUMIN: 3.6 g/dL (ref 3.5–5.0)
ALT: 25 U/L (ref 14–54)
AST: 32 U/L (ref 15–41)
Alkaline Phosphatase: 87 U/L (ref 38–126)
Anion gap: 10 (ref 5–15)
BILIRUBIN TOTAL: 0.5 mg/dL (ref 0.3–1.2)
BUN: 12 mg/dL (ref 6–20)
CALCIUM: 9.3 mg/dL (ref 8.9–10.3)
CO2: 26 mmol/L (ref 22–32)
Chloride: 103 mmol/L (ref 101–111)
Creatinine, Ser: 1.06 mg/dL — ABNORMAL HIGH (ref 0.44–1.00)
GFR calc Af Amer: 56 mL/min — ABNORMAL LOW (ref >60)
GFR calc non Af Amer: 48 mL/min — ABNORMAL LOW (ref >60)
GLUCOSE: 176 mg/dL — AB (ref 65–99)
Potassium: 3.5 mmol/L (ref 3.5–5.1)
Sodium: 139 mmol/L (ref 135–145)
TOTAL PROTEIN: 6.5 g/dL (ref 6.5–8.1)

## 2016-09-16 LAB — URINALYSIS, ROUTINE W REFLEX MICROSCOPIC
Bacteria, UA: NONE SEEN
Bilirubin Urine: NEGATIVE
GLUCOSE, UA: NEGATIVE mg/dL
Hgb urine dipstick: NEGATIVE
KETONES UR: NEGATIVE mg/dL
LEUKOCYTES UA: NEGATIVE
Nitrite: NEGATIVE
PH: 7 (ref 5.0–8.0)
Protein, ur: 100 mg/dL — AB
Specific Gravity, Urine: 1.003 — ABNORMAL LOW (ref 1.005–1.030)

## 2016-09-16 LAB — CBC
HCT: 37.6 % (ref 36.0–46.0)
Hemoglobin: 12.7 g/dL (ref 12.0–15.0)
MCH: 31.3 pg (ref 26.0–34.0)
MCHC: 33.8 g/dL (ref 30.0–36.0)
MCV: 92.6 fL (ref 78.0–100.0)
Platelets: 217 10*3/uL (ref 150–400)
RBC: 4.06 MIL/uL (ref 3.87–5.11)
RDW: 13.6 % (ref 11.5–15.5)
WBC: 10.2 10*3/uL (ref 4.0–10.5)

## 2016-09-16 LAB — LIPASE, BLOOD: Lipase: 20 U/L (ref 11–51)

## 2016-09-16 MED ORDER — ONDANSETRON HCL 4 MG/2ML IJ SOLN
4.0000 mg | Freq: Once | INTRAMUSCULAR | Status: AC | PRN
Start: 2016-09-16 — End: 2016-09-16
  Administered 2016-09-16: 4 mg via INTRAVENOUS
  Filled 2016-09-16: qty 2

## 2016-09-16 MED ORDER — METOCLOPRAMIDE HCL 5 MG/ML IJ SOLN
5.0000 mg | Freq: Once | INTRAMUSCULAR | Status: AC
  Administered 2016-09-16: 5 mg via INTRAVENOUS
  Filled 2016-09-16: qty 2

## 2016-09-16 MED ORDER — METOCLOPRAMIDE HCL 10 MG PO TABS: 10.0000 mg | ORAL_TABLET | Freq: Four times a day (QID) | ORAL | 0 refills | Status: AC | PRN

## 2016-09-16 NOTE — ED Provider Notes (Signed)
[facility]PT Provider Note: Monica Dell, MD, FACEP  CSN: [id number] MRN: [id number] ARRIVAL: 09/15/16 at 2338 ROOM: [facility]   CHIEF COMPLAINT  Abdominal Pain   HISTORY OF PRESENT ILLNESS  Monica Choi is a 83 y.o. female who complains of intermittent headaches, nausea, intermittent abdominal pain and general malaise since eating some unpalatable shrimp 3 evenings ago. Her family also reports that her blood pressure has been elevated. This may be due to her not taking her blood pressure medications regularly due to the nausea. They did give her her blood pressure medications yesterday and gave her some soup yesterday evening. She states it made her feel worse. She last took Tylenol about 5:30 PM yesterday without relief of her headache. She states her headache is located in the space between her eyes. Pain is moderate. She has not been vomiting. She had 4 normal bowel movements yesterday.  She had similar symptoms in May and was seen May 26. She had CTs of the abdomen, pelvis and head which were unremarkable.   Past Medical History:  Diagnosis Date  . Anemia   . Anxiety   . Arrhythmia   . Diverticulitis   . GERD (gastroesophageal reflux disease)   . Hypertension   . Rectal prolapse   . Renal disorder   . Sinus node dysfunction [facility])     Past Surgical History:  Procedure Laterality Date  . ABDOMINAL HYSTERECTOMY    . ABDOMINAL SURGERY    . CARDIAC SURGERY    . CATARACT EXTRACTION Bilateral   . CHOLECYSTECTOMY    . COLON SURGERY    . HERNIA REPAIR    . KNEE SURGERY    . NASAL SINUS SURGERY      Family History  Problem Relation Age of Onset  . Heart attack Father   . Heart attack Brother     Social History  Substance Use Topics  . Smoking status: Former Games developer  . Smokeless tobacco: Never Used  . Alcohol use Not on file    Prior to Admission medications   Medication Sig Start Date End Date Taking? Authorizing Provider  acetaminophen (ACETAMINOPHEN 8  HOUR) 650 MG CR tablet Take 1,300 mg by mouth every 8 (eight) hours as needed for pain.     [provider]  allopurinol (ZYLOPRIM) 100 MG tablet Take 1 tablet by mouth daily. 06/13/16 06/13/17  [provider]  amLODipine (NORVASC) 10 MG tablet Take 10 mg by mouth daily. 04/08/16   [provider]  aspirin 81 MG chewable tablet Chew 81 mg by mouth daily.    [provider]  BIOTIN 5000 PO Take 5,000 mg by mouth daily.    [provider]  bismuth subsalicylate (PEPTO BISMOL) 262 MG chewable tablet Chew 2 tablets by mouth as needed.    [provider]  carvedilol (COREG) 12.5 MG tablet Take 1 tablet (12.5 mg total) by mouth 2 (two) times daily. 07/17/16   Camnitz, Andree Coss, MD  cetirizine (ZYRTEC) 10 MG tablet Take 10 mg by mouth daily.    [provider]  Cholecalciferol (VITAMIN D) 2000 units CAPS Take 5,000 Units by mouth daily.    [provider]  DULoxetine (CYMBALTA) 30 MG capsule Take 30 mg by mouth 2 (two) times daily.    [provider]  hydrOXYzine (ATARAX/VISTARIL) 10 MG tablet Take 10 mg by mouth at bedtime.    [provider]  metoCLOPramide (REGLAN) 10 MG tablet Take 0.5 tablets (5 mg total) by mouth  every 6 (six) hours as needed for nausea (headache). 08/30/16   Doug SouJacubowitz, Sam, MD  nitroGLYCERIN (NITROSTAT) 0.4 MG SL tablet Place 0.4 mg under the tongue every 5 (five) minutes as needed for chest pain.    [provider]  pantoprazole (PROTONIX) 20 MG tablet Take 20 mg by mouth daily. 04/08/16   [provider]  predniSONE (DELTASONE) 20 MG tablet 2 tabs po daily x 7 days 08/30/16   Doug SouJacubowitz, Sam, MD  ramipril (ALTACE) 10 MG capsule Take 10 mg by mouth 2 (two) times daily.    [provider]  sertraline (ZOLOFT) 100 MG tablet Take 100 mg by mouth as needed.    [provider]  simvastatin (ZOCOR) 10 MG tablet Take 10 mg by mouth daily.    [provider]    sodium bicarbonate 650 MG tablet Take 650 mg by mouth daily. 03/27/16   [provider]    Allergies Morphine; Sulfa antibiotics; Amoxicillin; Benzonatate; Buprenorphine; Ciprofloxacin; and Morphine and related   REVIEW OF SYSTEMS  Negative except as noted here or in the History of Present Illness.   PHYSICAL EXAMINATION  Initial Vital Signs Blood pressure (!) 180/78, pulse 62, temperature 97.8 F (36.6 C), temperature source Oral, resp. rate 18, height 5\' 4"  (1.626 m), weight 63.5 kg (140 lb), SpO2 100 %.  Examination General: Well-developed, well-nourished female in no acute distress; appearance consistent with age of record HENT: normocephalic; atraumatic Eyes: pupils equal, round and reactive to light; extraocular muscles intact; lens implants Neck: supple Heart: regular rate and rhythm; frequent PACs Lungs: clear to auscultation bilaterally Abdomen: soft; nondistended; mild diffuse tenderness sparing the right lower quadrant; no masses or hepatosplenomegaly; bowel sounds present Extremities: No deformity; full range of motion; pulses normal Neurologic: Awake, alert; motor function intact in all extremities and symmetric; no facial droop Skin: Warm and dry Psychiatric: Normal mood and affect; circumstantial speech   RESULTS  Summary of this visit's results, reviewed by myself:   EKG Interpretation  Date/Time:    Ventricular Rate:    PR Interval:    QRS Duration:   QT Interval:    QTC Calculation:   R Axis:     Text Interpretation:        Laboratory Studies: Results for orders placed or performed during the hospital encounter of 09/15/16 (from the past 24 hour(s))  Urinalysis, Routine w reflex microscopic     Status: Abnormal   Collection Time: 09/15/16 11:57 PM  Result Value Ref Range   Color, Urine STRAW (A) YELLOW   APPearance CLEAR CLEAR   Specific Gravity, Urine 1.003 (L) 1.005 - 1.030   pH 7.0 5.0 - 8.0   Glucose, UA NEGATIVE NEGATIVE mg/dL    Hgb urine dipstick NEGATIVE NEGATIVE   Bilirubin Urine NEGATIVE NEGATIVE   Ketones, ur NEGATIVE NEGATIVE mg/dL   Protein, ur 119100 (A) NEGATIVE mg/dL   Nitrite NEGATIVE NEGATIVE   Leukocytes, UA NEGATIVE NEGATIVE   RBC / HPF 0-5 0 - 5 RBC/hpf   WBC, UA 0-5 0 - 5 WBC/hpf   Bacteria, UA NONE SEEN NONE SEEN   Squamous Epithelial / LPF 0-5 (A) NONE SEEN  Lipase, blood     Status: None   Collection Time: 09/16/16 12:54 AM  Result Value Ref Range   Lipase 20 11 - 51 U/L  Comprehensive metabolic panel     Status: Abnormal   Collection Time: 09/16/16 12:54 AM  Result Value Ref Range   Sodium 139 135 - 145  mmol/L   Potassium 3.5 3.5 - 5.1 mmol/L   Chloride 103 101 - 111 mmol/L   CO2 26 22 - 32 mmol/L   Glucose, Bld 176 (H) 65 - 99 mg/dL   BUN 12 6 - 20 mg/dL   Creatinine, Ser 1.61 (H) 0.44 - 1.00 mg/dL   Calcium 9.3 8.9 - 09.6 mg/dL   Total Protein 6.5 6.5 - 8.1 g/dL   Albumin 3.6 3.5 - 5.0 g/dL   AST 32 15 - 41 U/L   ALT 25 14 - 54 U/L   Alkaline Phosphatase 87 38 - 126 U/L   Total Bilirubin 0.5 0.3 - 1.2 mg/dL   GFR calc non Af Amer 48 (L) >60 mL/min   GFR calc Af Amer 56 (L) >60 mL/min   Anion gap 10 5 - 15  CBC     Status: None   Collection Time: 09/16/16 12:54 AM  Result Value Ref Range   WBC 10.2 4.0 - 10.5 K/uL   RBC 4.06 3.87 - 5.11 MIL/uL   Hemoglobin 12.7 12.0 - 15.0 g/dL   HCT 04.5 40.9 - 81.1 %   MCV 92.6 78.0 - 100.0 fL   MCH 31.3 26.0 - 34.0 pg   MCHC 33.8 30.0 - 36.0 g/dL   RDW 91.4 78.2 - 95.6 %   Platelets 217 150 - 400 K/uL   Imaging Studies: No results found.  ED COURSE  Nursing notes and initial vitals signs, including pulse oximetry, reviewed.  Vitals:   09/16/16 0330 09/16/16 0400 09/16/16 0434 09/16/16 0500  BP: (!) 152/61 (!) 162/76 (!) 155/110 (!) 158/67  Pulse: 68 67 84 62  Resp:   18 (!) 21  Temp:      TempSrc:      SpO2: 92% 90% 96% 91%  Weight:      Height:       5:36 AM Patient's headache and nausea relieved after 10 milligrams of  IV Reglan. She had her family member were devised reassuring laboratory studies. A combination of headache and nausea may represent a migraine. She does have a remote history of migraines. Her family member relates that she does have similar episodes somewhat frequently.  PROCEDURES    ED DIAGNOSES     ICD-10-CM   1. Nausea R11.0   2. Headache around the eyes R51        Tacia Hindley, Jonny Ruiz, MD 09/16/16 [phone]

## 2016-09-16 NOTE — ED Notes (Signed)
Pt  Is alert and oriented x 4 and is verbally responsive, pt states that she has been have abdominal pain since friday that radiates to central chest and feels like she has nausea but is unable to vomit   Pt denies diarrhea, and does report having a lot of flatulence.

## 2016-09-17 ENCOUNTER — Emergency Department (HOSPITAL_COMMUNITY): Payer: PPO

## 2016-09-17 ENCOUNTER — Observation Stay (HOSPITAL_COMMUNITY)
Admission: EM | Admit: 2016-09-17 | Discharge: 2016-09-18 | Disposition: A | Discharge status: Home / Self Care | Payer: PPO | Attending: Family Medicine | Admitting: Family Medicine

## 2016-09-17 DIAGNOSIS — Z90710 Acquired absence of both cervix and uterus: Secondary | ICD-10-CM | POA: Diagnosis not present

## 2016-09-17 DIAGNOSIS — M25519 Pain in unspecified shoulder: Secondary | ICD-10-CM

## 2016-09-17 DIAGNOSIS — Z881 Allergy status to other antibiotic agents status: Secondary | ICD-10-CM | POA: Insufficient documentation

## 2016-09-17 DIAGNOSIS — F329 Major depressive disorder, single episode, unspecified: Secondary | ICD-10-CM | POA: Diagnosis not present

## 2016-09-17 DIAGNOSIS — I129 Hypertensive chronic kidney disease with stage 1 through stage 4 chronic kidney disease, or unspecified chronic kidney disease: Secondary | ICD-10-CM | POA: Insufficient documentation

## 2016-09-17 DIAGNOSIS — Z87891 Personal history of nicotine dependence: Secondary | ICD-10-CM | POA: Diagnosis not present

## 2016-09-17 DIAGNOSIS — I672 Cerebral atherosclerosis: Secondary | ICD-10-CM | POA: Diagnosis not present

## 2016-09-17 DIAGNOSIS — F419 Anxiety disorder, unspecified: Secondary | ICD-10-CM | POA: Insufficient documentation

## 2016-09-17 DIAGNOSIS — E1165 Type 2 diabetes mellitus with hyperglycemia: Secondary | ICD-10-CM | POA: Insufficient documentation

## 2016-09-17 DIAGNOSIS — Z794 Long term (current) use of insulin: Secondary | ICD-10-CM | POA: Insufficient documentation

## 2016-09-17 DIAGNOSIS — I071 Rheumatic tricuspid insufficiency: Secondary | ICD-10-CM | POA: Insufficient documentation

## 2016-09-17 DIAGNOSIS — N183 Chronic kidney disease, stage 3 (moderate): Secondary | ICD-10-CM | POA: Insufficient documentation

## 2016-09-17 DIAGNOSIS — E1122 Type 2 diabetes mellitus with diabetic chronic kidney disease: Secondary | ICD-10-CM | POA: Insufficient documentation

## 2016-09-17 DIAGNOSIS — Z8249 Family history of ischemic heart disease and other diseases of the circulatory system: Secondary | ICD-10-CM | POA: Insufficient documentation

## 2016-09-17 DIAGNOSIS — Z950 Presence of cardiac pacemaker: Secondary | ICD-10-CM | POA: Insufficient documentation

## 2016-09-17 DIAGNOSIS — F0390 Unspecified dementia without behavioral disturbance: Secondary | ICD-10-CM | POA: Insufficient documentation

## 2016-09-17 DIAGNOSIS — Z888 Allergy status to other drugs, medicaments and biological substances status: Secondary | ICD-10-CM | POA: Insufficient documentation

## 2016-09-17 DIAGNOSIS — M25511 Pain in right shoulder: Secondary | ICD-10-CM | POA: Diagnosis not present

## 2016-09-17 DIAGNOSIS — G8929 Other chronic pain: Secondary | ICD-10-CM | POA: Diagnosis not present

## 2016-09-17 DIAGNOSIS — K219 Gastro-esophageal reflux disease without esophagitis: Secondary | ICD-10-CM | POA: Diagnosis not present

## 2016-09-17 DIAGNOSIS — M109 Gout, unspecified: Secondary | ICD-10-CM | POA: Insufficient documentation

## 2016-09-17 DIAGNOSIS — R519 Headache, unspecified: Secondary | ICD-10-CM

## 2016-09-17 DIAGNOSIS — M542 Cervicalgia: Secondary | ICD-10-CM | POA: Insufficient documentation

## 2016-09-17 DIAGNOSIS — Z882 Allergy status to sulfonamides status: Secondary | ICD-10-CM | POA: Insufficient documentation

## 2016-09-17 DIAGNOSIS — G459 Transient cerebral ischemic attack, unspecified: Secondary | ICD-10-CM | POA: Diagnosis not present

## 2016-09-17 DIAGNOSIS — E785 Hyperlipidemia, unspecified: Secondary | ICD-10-CM | POA: Diagnosis not present

## 2016-09-17 DIAGNOSIS — R51 Headache: Secondary | ICD-10-CM | POA: Insufficient documentation

## 2016-09-17 DIAGNOSIS — Z885 Allergy status to narcotic agent status: Secondary | ICD-10-CM | POA: Insufficient documentation

## 2016-09-17 DIAGNOSIS — Z8719 Personal history of other diseases of the digestive system: Secondary | ICD-10-CM | POA: Insufficient documentation

## 2016-09-17 DIAGNOSIS — Z79899 Other long term (current) drug therapy: Secondary | ICD-10-CM | POA: Insufficient documentation

## 2016-09-17 DIAGNOSIS — Z880 Allergy status to penicillin: Secondary | ICD-10-CM | POA: Insufficient documentation

## 2016-09-17 DIAGNOSIS — N179 Acute kidney failure, unspecified: Secondary | ICD-10-CM | POA: Insufficient documentation

## 2016-09-17 DIAGNOSIS — R030 Elevated blood-pressure reading, without diagnosis of hypertension: Secondary | ICD-10-CM | POA: Diagnosis not present

## 2016-09-17 DIAGNOSIS — R4702 Dysphasia: Secondary | ICD-10-CM | POA: Diagnosis not present

## 2016-09-17 DIAGNOSIS — R4701 Aphasia: Secondary | ICD-10-CM | POA: Diagnosis not present

## 2016-09-17 LAB — CBC
HEMATOCRIT: 40.4 % (ref 36.0–46.0)
Hemoglobin: 13.2 g/dL (ref 12.0–15.0)
MCH: 31.2 pg (ref 26.0–34.0)
MCHC: 32.7 g/dL (ref 30.0–36.0)
MCV: 95.5 fL (ref 78.0–100.0)
Platelets: 217 10*3/uL (ref 150–400)
RBC: 4.23 MIL/uL (ref 3.87–5.11)
RDW: 13.3 % (ref 11.5–15.5)
WBC: 10.5 10*3/uL (ref 4.0–10.5)

## 2016-09-17 LAB — CBG MONITORING, ED: GLUCOSE-CAPILLARY: 130 mg/dL — AB (ref 65–99)

## 2016-09-17 LAB — COMPREHENSIVE METABOLIC PANEL
ALK PHOS: 91 U/L (ref 38–126)
ALT: 25 U/L (ref 14–54)
AST: 32 U/L (ref 15–41)
Albumin: 3.6 g/dL (ref 3.5–5.0)
Anion gap: 10 (ref 5–15)
BILIRUBIN TOTAL: 0.6 mg/dL (ref 0.3–1.2)
BUN: 18 mg/dL (ref 6–20)
CALCIUM: 9.5 mg/dL (ref 8.9–10.3)
CO2: 24 mmol/L (ref 22–32)
CREATININE: 1.32 mg/dL — AB (ref 0.44–1.00)
Chloride: 104 mmol/L (ref 101–111)
GFR, EST AFRICAN AMERICAN: 43 mL/min — AB (ref >60)
GFR, EST NON AFRICAN AMERICAN: 37 mL/min — AB (ref >60)
Glucose, Bld: 131 mg/dL — ABNORMAL HIGH (ref 65–99)
Potassium: 4 mmol/L (ref 3.5–5.1)
Sodium: 138 mmol/L (ref 135–145)
Total Protein: 6.5 g/dL (ref 6.5–8.1)

## 2016-09-17 LAB — I-STAT CHEM 8, ED
BUN: 20 mg/dL (ref 6–20)
CALCIUM ION: 1.14 mmol/L — AB (ref 1.15–1.40)
CHLORIDE: 103 mmol/L (ref 101–111)
Creatinine, Ser: 1.3 mg/dL — ABNORMAL HIGH (ref 0.44–1.00)
GLUCOSE: 131 mg/dL — AB (ref 65–99)
HCT: 40 % (ref 36.0–46.0)
Hemoglobin: 13.6 g/dL (ref 12.0–15.0)
Potassium: 3.9 mmol/L (ref 3.5–5.1)
Sodium: 139 mmol/L (ref 135–145)
TCO2: 26 mmol/L (ref 0–100)

## 2016-09-17 LAB — I-STAT TROPONIN, ED: Troponin i, poc: 0.01 ng/mL (ref 0.00–0.08)

## 2016-09-17 LAB — DIFFERENTIAL
Basophils Absolute: 0.1 10*3/uL (ref 0.0–0.1)
Basophils Relative: 1 %
Eosinophils Absolute: 0.3 10*3/uL (ref 0.0–0.7)
Eosinophils Relative: 3 %
LYMPHS ABS: 3 10*3/uL (ref 0.7–4.0)
LYMPHS PCT: 29 %
MONO ABS: 0.6 10*3/uL (ref 0.1–1.0)
MONOS PCT: 6 %
Neutro Abs: 6.5 10*3/uL (ref 1.7–7.7)
Neutrophils Relative %: 61 %

## 2016-09-17 LAB — URINALYSIS, ROUTINE W REFLEX MICROSCOPIC
BILIRUBIN URINE: NEGATIVE
Bacteria, UA: NONE SEEN
Glucose, UA: NEGATIVE mg/dL
HGB URINE DIPSTICK: NEGATIVE
Ketones, ur: NEGATIVE mg/dL
Leukocytes, UA: NEGATIVE
NITRITE: NEGATIVE
PH: 7 (ref 5.0–8.0)
Protein, ur: 100 mg/dL — AB
SPECIFIC GRAVITY, URINE: 1.008 (ref 1.005–1.030)

## 2016-09-17 LAB — RAPID URINE DRUG SCREEN, HOSP PERFORMED
AMPHETAMINES: NOT DETECTED
Barbiturates: NOT DETECTED
Benzodiazepines: NOT DETECTED
Cocaine: NOT DETECTED
Opiates: NOT DETECTED
Tetrahydrocannabinol: NOT DETECTED

## 2016-09-17 LAB — PROTIME-INR
INR: 0.89
Prothrombin Time: 12 seconds (ref 11.4–15.2)

## 2016-09-17 LAB — ETHANOL: Alcohol, Ethyl (B): <5 mg/dL (ref <5)

## 2016-09-17 LAB — APTT: aPTT: 28 seconds (ref 24–36)

## 2016-09-17 LAB — SEDIMENTATION RATE: SED RATE: 38 mm/h — AB (ref 0–22)

## 2016-09-17 MED ORDER — DULOXETINE HCL 30 MG PO CPEP
30.0000 mg | ORAL_CAPSULE | Freq: Two times a day (BID) | ORAL | Status: DC
  Administered 2016-09-18 (×2): 30 mg via ORAL
  Filled 2016-09-17 (×2): qty 1

## 2016-09-17 MED ORDER — ACETAMINOPHEN 160 MG/5ML PO SOLN: 650.0000 mg | ORAL | Status: DC | PRN

## 2016-09-17 MED ORDER — ACETAMINOPHEN 650 MG RE SUPP: 650.0000 mg | RECTAL | Status: DC | PRN

## 2016-09-17 MED ORDER — VITAMIN D 1000 UNITS PO TABS
5000.0000 [IU] | ORAL_TABLET | Freq: Every day | ORAL | Status: DC
  Administered 2016-09-18: 5000 [IU] via ORAL
  Filled 2016-09-17: qty 5

## 2016-09-17 MED ORDER — SENNOSIDES-DOCUSATE SODIUM 8.6-50 MG PO TABS: 1.0000 | ORAL_TABLET | Freq: Every evening | ORAL | Status: DC | PRN

## 2016-09-17 MED ORDER — CARVEDILOL 12.5 MG PO TABS
12.5000 mg | ORAL_TABLET | Freq: Once | ORAL | Status: AC
  Administered 2016-09-18: 12.5 mg via ORAL
  Filled 2016-09-17: qty 1

## 2016-09-17 MED ORDER — SIMVASTATIN 10 MG PO TABS: 10.0000 mg | ORAL_TABLET | Freq: Every day | ORAL | Status: DC

## 2016-09-17 MED ORDER — METOCLOPRAMIDE HCL 5 MG/ML IJ SOLN
10.0000 mg | Freq: Once | INTRAMUSCULAR | Status: AC
  Administered 2016-09-17: 10 mg via INTRAVENOUS
  Filled 2016-09-17: qty 2

## 2016-09-17 MED ORDER — STROKE: EARLY STAGES OF RECOVERY BOOK
Freq: Once | Status: AC
  Administered 2016-09-18: 02:00:00
  Filled 2016-09-17: qty 1

## 2016-09-17 MED ORDER — FENTANYL CITRATE (PF) 100 MCG/2ML IJ SOLN
100.0000 ug | Freq: Once | INTRAMUSCULAR | Status: AC
  Administered 2016-09-18: 100 ug via INTRAVENOUS
  Filled 2016-09-17: qty 2

## 2016-09-17 MED ORDER — SODIUM BICARBONATE 650 MG PO TABS
650.0000 mg | ORAL_TABLET | Freq: Two times a day (BID) | ORAL | Status: DC
  Administered 2016-09-18 (×2): 650 mg via ORAL
  Filled 2016-09-17 (×2): qty 1

## 2016-09-17 MED ORDER — PANTOPRAZOLE SODIUM 20 MG PO TBEC
20.0000 mg | DELAYED_RELEASE_TABLET | Freq: Every day | ORAL | Status: DC
  Administered 2016-09-18: 20 mg via ORAL
  Filled 2016-09-17: qty 1

## 2016-09-17 MED ORDER — ACETAMINOPHEN 325 MG PO TABS: 650.0000 mg | ORAL_TABLET | ORAL | Status: DC | PRN

## 2016-09-17 NOTE — ED Triage Notes (Addendum)
Patient comes in per [facility] with stroke-like symptoms. Patient's LSW was 1730 this evening, though now symptoms have resolved. Patient had new onset at 1730 with right arm tingling and expressive aphasia. Hx of dementia. No hx of CVAs. No blood thinners. Patient has pacemaker. Symptoms lasted about 20 mins. Patient now a/ox4. Denies decreased sensation. Ems v/s 197/83, 163 cbg, 60 HR, 16 RR, 95% RA. No pain. ekg atrial rhythmn and prolonged QT with inverted P waves. Last May patient was admitted for migraines. Patient currently has h/a on right side.

## 2016-09-17 NOTE — H&P (Signed)
[facility] Admission History and Physical Service Pager: [phone]  Patient name: Monica Choi Medical record number: [id number] Date of birth: July 04, 1933 Age: 83 y.o. Gender: female  Primary Care Provider: Katherina Mires, MD Consultants: Neurology Code Status: Full   Chief Complaint: Right arm weakness, slurred speech and right sided facial droop   Assessment and Plan: Monica Choi is a 83 y.o. female wit a past medical history significant for   presenting HTN, T2DM, CKD3, AV block 2 w/ pacemaker, gout, HLD, depression, memory decline with presented with right UE weakness, right sided facial droop and slurred speech with quick resolution of symptoms concerning for TIA .   #Right arm weakness, right sided facial droop and slurred speech, acute, resolved Patient presented to [facility] after having right arm weakness, slurred speech and facial droop at home around 5:30 pm witnessed by family. Patient unable to raise right arm during event, also reported having difficulty expressing some words at first which evolve into slurred speech. Family also noticed right sided facial droop. Symptoms resolved within 15 min per family, prior to EMS arrival. Sequence of events and quick resolution of symptoms consistent with TIA, Head CT was negative for intracranial abnormalities concerning for a stroke. Patient has a pacemaker that is not compatible with MRI. Risk factors, HTN, HLD, T2DM. Patient has also complained of headaches described as pressure behind her eye and at times temporal for the past month and also noted to have elevated BP. Symptoms could also be secondary to hypertensive encephalopahty. Given risk factors, age, and quick resolution of symptoms, TIA/Stroke work up would be warranted.  --Admit to [facility]MT[facility], admitting physician Dr.Eniola --Admit to telemetry --Consult Neurology, appreciate recs: CT angiogram of head and neck with contrast  --Risk Stratification labs: Lipid  panel, A1c (obtained in March), TSH --Follw up on Carotid Doppler --Permissive HTN  --Follow up-Echocardiogram --Aspirin 325 mg  --Follow up on am BMP and CBC --Neuro check q2 --Monitor Vitals --Swallow test  --Fall precautions --PT/OT consult  #Headaches, chronic, moderate/severe Patient reports intermittent headaches for the past month. She describes them has pressure behind her eyes but also around her temporal area. Patient has a history of migraines, but denies having any in 60 years. Patient denies photophobia, and phonophobia or aura. Patient has also had elevated bp during in the past month which could be contributing to her headaches. There were concerns for temporal arteritis on past admission (5/26) with an elevated ESR of 46. On this admission, ESR is 38. Patient has an appointment on Friday (6/15), for temporal biopsy.Given patient's age, recent onset, concerning for physiologic concern unlikely infection- given lack of associated infectious symptoms,  mass lesion less likely given CT head negative thought not completely ruled out. Concern for vasculitis or temporal arteritis less likely given the minimal elevated ESR, could have some cervicogenic degenerative disease contributing. -- C-spine per neuro recs  --Prescribe Compazine 10 mg and benadryl 25 mg once  --Acetaminophen 650 mg q6 prn  #Hypertension, chronic On admission, BP was 207/101. Noted increase in BP during most visits to hospital, per family patient has white coat HTN. Generally blood pressure at home with SBP 120, however over the past month increasingly elevated BP at home. Non adherence to BP medications in the settings of declining cognitive ability (dementia) could be contributing to increase BP and headaches. Given TIA is working diagnosis, will allow for permissive hypertension. --Will hold Carvedilol 12.5 mg bid  --Will hold Ramipril 10 mg bid --Follow up on  Neuro recs, restart when clinically  appropriate    #Acute on Chromic kidney injury Patient with a history of CKD 3. Baseline creatinine ~1.1. On admission Creatinine is 1.3. Slight increase likely secondary to decrease po intake. --Follow up on am BMP  --will continue Cholecalciferol  --Will continue Sodium Bicarbonate --Encourage po fluid   #Shoulder pain, chronic; likely Rotator cuff strain Patient complain of right shoulder pain with decrease range of motion. Patient with a history of arthritic changes whic h was aggravated by fall out of bed a few month . Prior imaging showed no fracture and moderate generalized osteoarthritic change per family  -- physical therapy will be important to ensure patient does not develop adhesive capsulitis  --Will continue to monitor --Patient can follow up with PCP --Acetaminophen 650 mg q6 prn as needed  #T2DM, uncontrolled Patient has T2DM but does not appear to be on any medications. Last A1c was 7.7 on 05/09/2016. Blood glucose on admission was 131. --Follow up on A1c --Moderate SSI  --CBG qac/hs --Follow up with PCP  #GERD Well controlled on home regimen --Will continue pantoprazole 20 mg daily   #Depression, chronic, stable Patient with a history of depression and anxiety previously on Sertraline and Aricept. Patient recently move to the area from [place] after loss of husband and decrease independence. PHQ-9 score was 7 on 05/09/2016.  --Will continue Duloxetine 30 mg bid  #Cognitive decline Patient's family reports increasing changes in mood and behaviors with concern for early sign of dementia. MMSE score at PCP was 26. CT head was scheduled  By PCP for further evaluation of memory and mood changes. CT showed periventricular white matter and corona radiata hypodensities favor chronic ischemic microvascular white matter disease.  --Will defer to PCP for continued monitoring  #Gout Well controlled with home regimen --Will continue Allopurinol 100 mg daily  #Hyperlipidemia --Continue  Simvastatin 10 mg daily   FEN/GI: Carb modified diet Prophylaxis: Lovenox (discussed with neurology)  Disposition: Home pending TIA work up  History of Present Illness:  Sahra Converse is a 83 y.o. female with apas t medical history significant for HTN, T2DM, CKD3, AV block 2 w/ pacemaker, gout, HLD, depression and memory decline who presented with right arm weakness, slurred speech and right sided facial droop. History was obtain from family member who were present during episode. Around 5:OO p m patient talked to family members and she was at baseline. Around 5:15 daughter and son in law visited patient , who started to reports difficulty talking and not be able to say the words she had in her mind. Subsequently patient reported right arm numbness from the tip of her fingers to her shoulder. Son in law also noticed that her speech had drastically changed and though patient was talking it made no sense. Daughter also noted right sided facial droop. Son in law ask patient to raise both arms and she was only able to raise the left arm. Family called 40 and by the time EMS arrived around 5:45 pm, per family most of her symptoms had resolved and patient was essentially at baseline. She continue to complain of headaches which she has been experiencing for the past weeks. Patient has been seen two times in the ED in the past two weeks 5/26 and 6/11 for similar symptoms of Headaches accompanied with nausea. In the ED, patient was given Reglan for her headaches which seemed to provide some relief. Head CT was done and was normal with no abnormalities. Patient was unable to  get an MRI given incompatibility with pacemaker. Trop was 0.01., creatinine was mildly elevated at 1.3. UDS wa snegative , alcohol <5 and UA was non concerning. Neurology was consulted for stroke/TIA rule out and FMTS was called for admission.   Review Of Systems: Per HPI with the following additions:   Review of Systems  Constitutional:  Negative.   HENT: Positive for hearing loss and tinnitus.   Eyes: Positive for pain.  Respiratory: Negative for cough and sputum production.   Cardiovascular: Negative for chest pain.  Gastrointestinal: Positive for nausea. Negative for abdominal pain and vomiting.  Musculoskeletal: Positive for joint pain.  Skin: Negative.   Neurological: Positive for sensory change, focal weakness and headaches. Negative for dizziness.  All other systems reviewed and are negative.   Patient Active Problem List   Diagnosis Date Noted  . Hypertension 07/17/2016  . Sick sinus syndrome (HCC) 07/17/2016    Past Medical History: Past Medical History:  Diagnosis Date  . Anemia   . Anxiety   . Arrhythmia   . Diverticulitis   . GERD (gastroesophageal reflux disease)   . Hypertension   . Rectal prolapse   . Renal disorder   . Sinus node dysfunction (HCC)     Past Surgical History: Past Surgical History:  Procedure Laterality Date  . ABDOMINAL HYSTERECTOMY    . ABDOMINAL SURGERY    . CARDIAC SURGERY    . CATARACT EXTRACTION Bilateral   . CHOLECYSTECTOMY    . COLON SURGERY    . HERNIA REPAIR    . KNEE SURGERY    . NASAL SINUS SURGERY      Social History: Social History  Substance Use Topics  . Smoking status: Former Games developer  . Smokeless tobacco: Never Used  . Alcohol use Not on file   Additional social history:   Please also refer to relevant sections of EMR.  Family History: Family History  Problem Relation Age of Onset  . Heart attack Father   . Heart attack Brother    (If not completed, MUST add something in)  Allergies and Medications: Allergies  Allergen Reactions  . Morphine Nausea And Vomiting  . Sulfa Antibiotics Anaphylaxis and Rash  . Amoxicillin Other (See Comments)  . Benzonatate Other (See Comments)  . Buprenorphine Nausea And Vomiting  . Ciprofloxacin Itching and Rash  . Morphine And Related Nausea And Vomiting   No current facility-administered  medications on file prior to encounter.    Current Outpatient Prescriptions on File Prior to Encounter  Medication Sig Dispense Refill  . acetaminophen (ACETAMINOPHEN 8 HOUR) 650 MG CR tablet Take 1,300 mg by mouth every 8 (eight) hours as needed for pain.     Marland Kitchen allopurinol (ZYLOPRIM) 100 MG tablet Take 1 tablet by mouth daily.    Marland Kitchen BIOTIN 5000 PO Take 5,000 mg by mouth daily.    Marland Kitchen bismuth subsalicylate (PEPTO BISMOL) 262 MG chewable tablet Chew 2 tablets by mouth daily as needed for indigestion or diarrhea or loose stools.     . carvedilol (COREG) 12.5 MG tablet Take 1 tablet (12.5 mg total) by mouth 2 (two) times daily. 60 tablet 11  . cetirizine (ZYRTEC) 10 MG tablet Take 10 mg by mouth daily.    . Cholecalciferol (VITAMIN D) 2000 units CAPS Take 5,000 Units by mouth daily.    . DULoxetine (CYMBALTA) 30 MG capsule Take 30 mg by mouth 2 (two) times daily.    . metoCLOPramide (REGLAN) 10 MG tablet Take 1 tablet (10 mg  total) by mouth every 6 (six) hours as needed (for nausea/headache). 12 tablet 0  . nitroGLYCERIN (NITROSTAT) 0.4 MG SL tablet Place 0.4 mg under the tongue every 5 (five) minutes as needed for chest pain.    . pantoprazole (PROTONIX) 20 MG tablet Take 20 mg by mouth daily.    . ramipril (ALTACE) 10 MG capsule Take 10 mg by mouth 2 (two) times daily.    . simvastatin (ZOCOR) 10 MG tablet Take 10 mg by mouth daily.    . sodium bicarbonate 650 MG tablet Take 650 mg by mouth 2 (two) times daily.       Objective: BP (!) 190/84   Pulse 61   Resp 13   Ht '5\' 4"'$  (1.626 m)   Wt 140 lb (63.5 kg)   SpO2 95%   BMI 24.03 kg/m    Physical Exam: General: Elderly woman appear to be in some discomfort still pleasant, able to participate in exam and answer questions appropriately Cardiac: RRR, normal heart sounds, systolic murmur appreciated at the right sternal border. 2+ radial and PT pulses bilaterally Respiratory: CTAB, normal effort, No wheezes, mild crackles noted at the  bases Abdomen: soft, nontender, nondistended, no hepatic or splenomegaly, +BS Extremities: no edema or cyanosis. WWP. Skin: warm and dry, no rashes noted Neuro: alert and oriented x4, [place] 5/5 Upper and lower extremities bilaterally, sensation intact UE and LE bilaterlally. Decrease ROM right arm 2/2 to shoulder pain. Finger to nose difficult to assess in right arm, intact in left arm, no dysarthria noted. Unable to perform romberg given right shoulder pain. Psych: Normal affect and mood  Labs and Imaging: CBC BMET   Recent Labs Lab 09/17/16 1908 09/17/16 1937  WBC 10.5  --   HGB 13.2 13.6  HCT 40.4 40.0  PLT 217  --     Recent Labs Lab 09/17/16 1908 09/17/16 1937  NA 138 139  K 4.0 3.9  CL 104 103  CO2 24  --   BUN 18 20  CREATININE 1.32* 1.30*  GLUCOSE 131* 131*  CALCIUM 9.5  --      Troponin (Point of Care Test)  Recent Labs  09/17/16 1935  TROPIPOC 0.01   Erythrocyte Sedimentation Rate     Component Value Date/Time   ESRSEDRATE 38 (H) 09/17/2016 1908   Ct Head Wo Contrast  Result Date: 09/17/2016 CLINICAL DATA:  Right arm tingling and expressive aphasia onset. History of dementia. EXAM: CT HEAD WITHOUT CONTRAST TECHNIQUE: Contiguous axial images were obtained from the base of the skull through the vertex without intravenous contrast. COMPARISON:  08/30/2016 FINDINGS: Brain: The brainstem, cerebellum, cerebral peduncles, thalami, basal ganglia, basilar cisterns, and ventricular system appear within normal limits. Periventricular white matter and corona radiata hypodensities favor chronic ischemic microvascular white matter disease. No intracranial hemorrhage, mass lesion, or acute CVA. Vascular: Atherosclerotic calcification of the vertebral arteries and cavernous carotid arteries. Skull: Unremarkable Sinuses/Orbits: Hypoplastic maxillary sinuses with prior antrectomies. Other: No supplemental non-categorized findings. IMPRESSION: 1. No acute intracranial  findings are identified. 2. Periventricular white matter and corona radiata hypodensities favor chronic ischemic microvascular white matter disease. 3. Atherosclerosis. Electronically Signed   By: Van Clines M.D.   On: 09/17/2016 19:55     Marjie Skiff, MD 09/17/2016, 10:53 PM PGY-1, Commerce Intern pager: [phone], text pages welcome

## 2016-09-17 NOTE — ED Notes (Signed)
Admitting Providers at bedside. 

## 2016-09-17 NOTE — ED Notes (Signed)
Patient to CT.

## 2016-09-17 NOTE — ED Notes (Signed)
ED Provider at bedside. 

## 2016-09-17 NOTE — ED Provider Notes (Signed)
[facility]-EMERGENCY DEPT Provider Note   CSN: [id number] Arrival date & time: 09/17/16  1845     History   Chief Complaint Chief Complaint  Patient presents with  . Aphasia  . Tingling    HPI Monica Choi is a 83 y.o. female.  Patient is here for evaluation of transient symptoms including aphasia, dysarthria, right arm numbness, right arm weakness, confusion, and headache.  Duration of this constellation of symptoms was 20 minutes.  It was completely visualized by family members.  They called EMS, and her symptoms began to resolve, prior to their arrival.  On arrival in the emergency department she is essentially at her baseline.  She has had a number of other problems recently including intermittent headache, intermittent vision loss in right eye, right shoulder pain, falling, abdominal pain, periods of confusion, and general weakness.  She was hospitalized recently.  She has been told that she may have temporal arteritis.  She recently had an abdominal CT.  There has been no recent fever, chills, nausea, vomiting, dysuria, change in bowel habits.  There are no other known modifying factors.   HPI  Past Medical History:  Diagnosis Date  . Anemia   . Anxiety   . Arrhythmia   . Diverticulitis   . GERD (gastroesophageal reflux disease)   . Hypertension   . Rectal prolapse   . Renal disorder   . Sinus node dysfunction [facility])     Patient Active Problem List   Diagnosis Date Noted  . Nonintractable headache   . TIA (transient ischemic attack) 09/17/2016  . Hypertension 07/17/2016  . Sick sinus syndrome (HCC) 07/17/2016    Past Surgical History:  Procedure Laterality Date  . ABDOMINAL HYSTERECTOMY    . ABDOMINAL SURGERY    . CARDIAC SURGERY    . CATARACT EXTRACTION Bilateral   . CHOLECYSTECTOMY    . COLON SURGERY    . HERNIA REPAIR    . KNEE SURGERY    . NASAL SINUS SURGERY      OB History    No data available       Home Medications    Prior to Admission  medications   Medication Sig Start Date End Date Taking? Authorizing Provider  acetaminophen (ACETAMINOPHEN 8 HOUR) 650 MG CR tablet Take 1,300 mg by mouth every 8 (eight) hours as needed for pain.    Yes [provider]  allopurinol (ZYLOPRIM) 100 MG tablet Take 1 tablet by mouth daily. 06/13/16 06/13/17 Yes [provider]  BIOTIN 5000 PO Take 5,000 mg by mouth daily.   Yes [provider]  bismuth subsalicylate (PEPTO BISMOL) 262 MG chewable tablet Chew 2 tablets by mouth daily as needed for indigestion or diarrhea or loose stools.    Yes [provider]  carvedilol (COREG) 12.5 MG tablet Take 1 tablet (12.5 mg total) by mouth 2 (two) times daily. 07/17/16  Yes Camnitz, Will Daphine Deutscher, MD  cetirizine (ZYRTEC) 10 MG tablet Take 10 mg by mouth daily.   Yes [provider]  Cholecalciferol (VITAMIN D) 2000 units CAPS Take 5,000 Units by mouth daily.   Yes [provider]  DULoxetine (CYMBALTA) 30 MG capsule Take 30 mg by mouth 2 (two) times daily.   Yes [provider]  metoCLOPramide (REGLAN) 10 MG tablet Take 1 tablet (10 mg total) by mouth every 6 (six) hours as needed (for nausea/headache). 09/16/16  Yes Molpus, John, MD  nitroGLYCERIN (NITROSTAT) 0.4 MG SL tablet Place 0.4 mg under the tongue every  5 (five) minutes as needed for chest pain.   Yes [provider]  pantoprazole (PROTONIX) 20 MG tablet Take 20 mg by mouth daily. 04/08/16  Yes [provider]  ramipril (ALTACE) 10 MG capsule Take 10 mg by mouth 2 (two) times daily.   Yes [provider]  simvastatin (ZOCOR) 10 MG tablet Take 10 mg by mouth daily.   Yes [provider]  sodium bicarbonate 650 MG tablet Take 650 mg by mouth 2 (two) times daily.  03/27/16  Yes [provider]    Family History Family History  Problem Relation Age of Onset  . Heart attack Father   . Heart attack Brother     Social History Social History  Substance  Use Topics  . Smoking status: Former Games developer  . Smokeless tobacco: Never Used  . Alcohol use Not on file     Allergies   Morphine; Sulfa antibiotics; Amoxicillin; Benzonatate; Buprenorphine; Ciprofloxacin; and Morphine and related   Review of Systems Review of Systems  All other systems reviewed and are negative.    Physical Exam Updated Vital Signs BP (!) 165/50 (BP Location: Left Arm)   Pulse 61   Temp 97.9 F (36.6 C) (Oral)   Resp 16   Ht 5\' 4"  (1.626 m)   Wt 63.5 kg (139 lb 15.9 oz)   SpO2 94%   BMI 24.03 kg/m   Physical Exam  Constitutional: She is oriented to person, place, and time. She appears well-developed and well-nourished. No distress.  HENT:  Head: Normocephalic and atraumatic.  Eyes: Conjunctivae and EOM are normal. Pupils are equal, round, and reactive to light.  Neck: Normal range of motion and phonation normal. Neck supple.  Cardiovascular: Normal rate and regular rhythm.   Pulmonary/Chest: Effort normal and breath sounds normal. She exhibits no tenderness.  Abdominal: Soft. She exhibits no distension. There is no tenderness. There is no guarding.  Musculoskeletal: Normal range of motion.  Neurological: She is alert and oriented to person, place, and time. She exhibits normal muscle tone.  Mild dysarthria is present.  No aphasia.  No nystagmus.  Normal grip strength bilateral arms.  On shoulder strength testing, she has pain in the right shoulder.  This limits motion of the right shoulder.  Skin: Skin is warm and dry.  Psychiatric: She has a normal mood and affect. Her behavior is normal. Judgment and thought content normal.  Nursing note and vitals reviewed.    ED Treatments / Results  Labs (all labs ordered are listed, but only abnormal results are displayed) Labs Reviewed  COMPREHENSIVE METABOLIC PANEL - Abnormal; Notable for the following:       Result Value   Glucose, Bld 131 (*)    Creatinine, Ser 1.32 (*)    GFR calc non Af Amer 37 (*)     GFR calc Af Amer 43 (*)    All other components within normal limits  URINALYSIS, ROUTINE W REFLEX MICROSCOPIC - Abnormal; Notable for the following:    Color, Urine STRAW (*)    Protein, ur 100 (*)    Squamous Epithelial / LPF 0-5 (*)    All other components within normal limits  SEDIMENTATION RATE - Abnormal; Notable for the following:    Sed Rate 38 (*)    All other components within normal limits  LIPID PANEL - Abnormal; Notable for the following:    Triglycerides 239 (*)    HDL 36 (*)    VLDL 48 (*)  All other components within normal limits  GLUCOSE, CAPILLARY - Abnormal; Notable for the following:    Glucose-Capillary 158 (*)    All other components within normal limits  BASIC METABOLIC PANEL - Abnormal; Notable for the following:    Potassium 3.4 (*)    Glucose, Bld 137 (*)    Creatinine, Ser 1.08 (*)    GFR calc non Af Amer 47 (*)    GFR calc Af Amer 55 (*)    All other components within normal limits  CBG MONITORING, ED - Abnormal; Notable for the following:    Glucose-Capillary 130 (*)    All other components within normal limits  I-STAT CHEM 8, ED - Abnormal; Notable for the following:    Creatinine, Ser 1.30 (*)    Glucose, Bld 131 (*)    Calcium, Ion 1.14 (*)    All other components within normal limits  ETHANOL  PROTIME-INR  APTT  CBC  DIFFERENTIAL  RAPID URINE DRUG SCREEN, HOSP PERFORMED  TSH  CBC  HEMOGLOBIN A1C  I-STAT TROPOININ, ED    EKG  EKG Interpretation  Date/Time:  Wednesday September 17 2016 19:07:04 EDT Ventricular Rate:  68 PR Interval:    QRS Duration: 84 QT Interval:  474 QTC Calculation: 505 R Axis:   -30 Text Interpretation:  Sinus rhythm Left ventricular hypertrophy Probable anterior infarct, age indeterminate Lateral leads are also involved Prolonged QT interval since last tracing no significant change Confirmed by Mancel BaleWentz, Kennidee Heyne [phone]) on 09/17/2016 10:26:07 PM Also confirmed by Mancel BaleWentz, Tejon Gracie [phone]), editor Elita QuickWatlington,  Beverly ([id number])  on 09/18/2016 7:17:23 AM       Radiology Ct Angio Head W Or Wo Contrast  Result Date: 09/18/2016 CLINICAL DATA:  Follow-up neck and shoulder pain.  TIA. EXAM: CT ANGIOGRAPHY HEAD AND NECK TECHNIQUE: Multidetector CT imaging of the head and neck was performed using the standard protocol during bolus administration of intravenous contrast. Multiplanar CT image reconstructions and MIPs were obtained to evaluate the vascular anatomy. Carotid stenosis measurements (when applicable) are obtained utilizing NASCET criteria, using the distal internal carotid diameter as the denominator. CONTRAST:  50 cc Isovue 370 intravenous COMPARISON:  Head CT from yesterday FINDINGS: CTA NECK FINDINGS Aortic arch: Atheromatous wall thickening and calcification of the arch. 4 vessel branching with aberrant right subclavian artery with retroesophageal course. Right carotid system: Mild to moderate calcified plaque at the common carotid bifurcation without stenosis or ulceration. No dissection or beading. Left carotid system: Mild to moderate predominately calcified atheromatous plaque at the common carotid bifurcation without flow limiting stenosis. Proximal ICA narrowing measures at most 20%. No dissection or ulceration noted. Vertebral arteries: Proximal subclavian atherosclerosis without significant stenosis. Codominant vertebral arteries. Mild right vertebral origin narrowing at an atherosclerotic plaque. Otherwise vessels are smooth and widely patent to the dura. Skeleton: No acute or aggressive finding Other neck: No incidental mass or inflammation. Upper chest: Minimal centrilobular emphysematous change Review of the MIP images confirms the above findings CTA HEAD FINDINGS Anterior circulation: Atherosclerotic plaque in the bilateral siphons with moderate left paraclinoid segment ICA stenosis. Intact circle-of-Willis with hypoplastic left A1 segment. No branch occlusion, beading, or aneurysm. Posterior  circulation: Mild left vertebral artery dominance. Standard vertebrobasilar branching. The vertebral and basilar arteries are smooth and widely patent, with mild calcified plaque on the vertebral arteries. Symmetric visualization of vertebrobasilar branches. Venous sinuses: Negative Anatomic variants: None significant. Delayed phase: No abnormal intracranial enhancement. Review of the MIP images confirms the above findings IMPRESSION: 1. No acute  arterial finding.  No evidence of infarct. 2. Cervical carotid atherosclerosis without flow limiting stenosis or ulceration. 3. Intracranial atherosclerosis primarily affecting the vertebral and carotid arteries. Moderate left paraclinoid ICA stenosis. Electronically Signed   By: Marnee Spring M.D.   On: 09/18/2016 08:21   Ct Head Wo Contrast  Result Date: 09/17/2016 CLINICAL DATA:  Right arm tingling and expressive aphasia onset. History of dementia. EXAM: CT HEAD WITHOUT CONTRAST TECHNIQUE: Contiguous axial images were obtained from the base of the skull through the vertex without intravenous contrast. COMPARISON:  08/30/2016 FINDINGS: Brain: The brainstem, cerebellum, cerebral peduncles, thalami, basal ganglia, basilar cisterns, and ventricular system appear within normal limits. Periventricular white matter and corona radiata hypodensities favor chronic ischemic microvascular white matter disease. No intracranial hemorrhage, mass lesion, or acute CVA. Vascular: Atherosclerotic calcification of the vertebral arteries and cavernous carotid arteries. Skull: Unremarkable Sinuses/Orbits: Hypoplastic maxillary sinuses with prior antrectomies. Other: No supplemental non-categorized findings. IMPRESSION: 1. No acute intracranial findings are identified. 2. Periventricular white matter and corona radiata hypodensities favor chronic ischemic microvascular white matter disease. 3. Atherosclerosis. Electronically Signed   By: Gaylyn Rong M.D.   On: 09/17/2016 19:55    Ct Angio Neck W Or Wo Contrast  Result Date: 09/18/2016 CLINICAL DATA:  Follow-up neck and shoulder pain.  TIA. EXAM: CT ANGIOGRAPHY HEAD AND NECK TECHNIQUE: Multidetector CT imaging of the head and neck was performed using the standard protocol during bolus administration of intravenous contrast. Multiplanar CT image reconstructions and MIPs were obtained to evaluate the vascular anatomy. Carotid stenosis measurements (when applicable) are obtained utilizing NASCET criteria, using the distal internal carotid diameter as the denominator. CONTRAST:  50 cc Isovue 370 intravenous COMPARISON:  Head CT from yesterday FINDINGS: CTA NECK FINDINGS Aortic arch: Atheromatous wall thickening and calcification of the arch. 4 vessel branching with aberrant right subclavian artery with retroesophageal course. Right carotid system: Mild to moderate calcified plaque at the common carotid bifurcation without stenosis or ulceration. No dissection or beading. Left carotid system: Mild to moderate predominately calcified atheromatous plaque at the common carotid bifurcation without flow limiting stenosis. Proximal ICA narrowing measures at most 20%. No dissection or ulceration noted. Vertebral arteries: Proximal subclavian atherosclerosis without significant stenosis. Codominant vertebral arteries. Mild right vertebral origin narrowing at an atherosclerotic plaque. Otherwise vessels are smooth and widely patent to the dura. Skeleton: No acute or aggressive finding Other neck: No incidental mass or inflammation. Upper chest: Minimal centrilobular emphysematous change Review of the MIP images confirms the above findings CTA HEAD FINDINGS Anterior circulation: Atherosclerotic plaque in the bilateral siphons with moderate left paraclinoid segment ICA stenosis. Intact circle-of-Willis with hypoplastic left A1 segment. No branch occlusion, beading, or aneurysm. Posterior circulation: Mild left vertebral artery dominance. Standard  vertebrobasilar branching. The vertebral and basilar arteries are smooth and widely patent, with mild calcified plaque on the vertebral arteries. Symmetric visualization of vertebrobasilar branches. Venous sinuses: Negative Anatomic variants: None significant. Delayed phase: No abnormal intracranial enhancement. Review of the MIP images confirms the above findings IMPRESSION: 1. No acute arterial finding.  No evidence of infarct. 2. Cervical carotid atherosclerosis without flow limiting stenosis or ulceration. 3. Intracranial atherosclerosis primarily affecting the vertebral and carotid arteries. Moderate left paraclinoid ICA stenosis. Electronically Signed   By: Marnee Spring M.D.   On: 09/18/2016 08:21   Ct C-spine No Charge  Result Date: 09/18/2016 CLINICAL DATA:  Neck and shoulder pain. EXAM: CT CERVICAL SPINE WITHOUT CONTRAST TECHNIQUE: [place] in reformats of the cervical spine were  generated from head neck CTA. COMPARISON:  None. FINDINGS: Alignment: Slight anterolisthesis at C3-4, C4-5, C5-6, and C6-7, facet mediated. Skull base and vertebrae: Negative for fracture or bone lesion. Incomplete atlanto occipital segmentation. Soft tissues and spinal canal: No prevertebral fluid or swelling. No visible canal hematoma. Disc levels: C2-3: Right more than left facet spurring. No evidence of canal or foraminal impingement C3-4: Facet arthropathy with bulky spurring and slight anterolisthesis. Unremarkable disc space. Mild left and moderate right foraminal narrowing. Patent spinal canal C4-5: Facet arthropathy with bulky spurring and slight anterolisthesis. Minimal uncovertebral ridging. Moderate foraminal narrowing, greater on the left. No evidence of canal impingement. C5-6: Facet arthropathy with bulky spurring. Slight anterolisthesis. Mild spondylosis. No evidence of canal or foraminal impingement. C6-7: Facet arthropathy with moderate spurring. Slight anterolisthesis. No notable disc degeneration. No  evidence of canal or foraminal impingement. C7-T1:Asymmetric right-sided facet spurring.  No impingement. Upper chest: Negative IMPRESSION: 1. No acute or aggressive finding. 2. Diffuse facet arthropathy with multilevel mild anterolisthesis. Facet hypertrophy causes moderate foraminal narrowing on the right at C3-4 and bilaterally at C4-5. Electronically Signed   By: Marnee Spring M.D.   On: 09/18/2016 08:27    Procedures Procedures (including critical care time)  Medications Ordered in ED Medications  cholecalciferol (VITAMIN D) tablet 5,000 Units (5,000 Units Oral Given 09/18/16 0918)  DULoxetine (CYMBALTA) DR capsule 30 mg (30 mg Oral Given 09/18/16 0918)  sodium bicarbonate tablet 650 mg (650 mg Oral Given 09/18/16 0918)  pantoprazole (PROTONIX) EC tablet 20 mg (20 mg Oral Given 09/18/16 0918)  acetaminophen (TYLENOL) tablet 650 mg (not administered)    Or  acetaminophen (TYLENOL) solution 650 mg (not administered)    Or  acetaminophen (TYLENOL) suppository 650 mg (not administered)  senna-docusate (Senokot-S) tablet 1 tablet (not administered)  enoxaparin (LOVENOX) injection 30 mg (not administered)  insulin aspart (novoLOG) injection 0-15 Units (2 Units Subcutaneous Given 09/18/16 1300)  aspirin tablet 325 mg (325 mg Oral Given 09/18/16 0918)  sodium chloride flush (NS) 0.9 % injection 10-40 mL (10 mLs Intracatheter Not Given 09/18/16 1000)  sodium chloride flush (NS) 0.9 % injection 10-40 mL (not administered)  atorvastatin (LIPITOR) tablet 40 mg (not administered)  metoCLOPramide (REGLAN) injection 10 mg (10 mg Intravenous Given 09/17/16 2248)   stroke: mapping our early stages of recovery book ( Does not apply Given 09/18/16 0157)  fentaNYL (SUBLIMAZE) injection 100 mcg (100 mcg Intravenous Given 09/18/16 0006)  carvedilol (COREG) tablet 12.5 mg (12.5 mg Oral Given 09/18/16 0003)  prochlorperazine (COMPAZINE) injection 10 mg (10 mg Intravenous Given by Other 09/18/16 0254)    diphenhydrAMINE (BENADRYL) capsule 25 mg (25 mg Oral Given by Other 09/18/16 0254)  iopamidol (ISOVUE-370) 76 % injection (50 mLs  Contrast Given 09/18/16 0547)     Initial Impression / Assessment and Plan / ED Course  I have reviewed the triage vital signs and the nursing notes.  Pertinent labs & imaging results that were available during my care of the patient were reviewed by me and considered in my medical decision making (see chart for details).  Clinical Course as of Sep 18 1336  Wed Sep 17, 2016  1933 The patient has a cardiac pacemaker, which does not appear to be MRI compatible- pacemaker implant [employer] Jude Model PM [id number] serial #[id number])  [EW]    Clinical Course User Index [EW] Mancel Bale, MD     Patient Vitals for the past 24 hrs:  BP Temp Temp src Pulse Resp SpO2 Height Weight  09/18/16 1300 (!) 165/50 - Oral 61 16 94 % - -  09/18/16 1100 (!) 135/48 97.9 F (36.6 C) Oral 60 16 92 % - -  09/18/16 0900 (!) 162/54 98.2 F (36.8 C) Oral (!) 58 16 95 % - -  09/18/16 0700 (!) 189/71 97.3 F (36.3 C) Oral 63 18 93 % - -  09/18/16 0500 139/61 97.5 F (36.4 C) Oral 64 16 95 % - -  09/18/16 0300 (!) 179/84 98.1 F (36.7 C) Oral 66 16 96 % - -  09/18/16 0130 (!) 195/86 97.7 F (36.5 C) Oral 60 16 96 % 5\' 4"  (1.626 m) 63.5 kg (139 lb 15.9 oz)  09/18/16 0100 (!) 194/81 - - 66 13 94 % - -  09/18/16 0030 (!) 198/92 - - 73 13 95 % - -  09/17/16 2340 - - - - - 96 % - -  09/17/16 2330 (!) 222/87 - - 69 12 95 % - -  09/17/16 2300 - - - 79 (!) 23 93 % - -  09/17/16 2230 (!) 190/84 - - 61 13 95 % - -  09/17/16 2200 (!) 193/58 - - 71 11 94 % - -  09/17/16 2100 (!) 198/66 - - 63 - 96 % - -  09/17/16 2030 (!) 218/65 - - 64 - 98 % - -  09/17/16 2000 (!) 217/86 - - 72 16 92 % - -  09/17/16 1930 (!) 207/101 - - 67 19 94 % - -  09/17/16 1847 - - - - - - 5\' 4"  (1.626 m) 63.5 kg (140 lb)    At the time of disposition- reevaluation with update and discussion. After initial assessment  and treatment, an updated evaluation reveals she continues to be uncomfortable with some nausea and persistent headache.  Patient and family members updated on findings and plan.Mancel Bale L    Final Clinical Impressions(s) / ED Diagnoses   Final diagnoses:  TIA (transient ischemic attack)  Neck and shoulder pain   Evaluation is consistent with TIA.  Also nonspecific headache, and high blood pressure.  Patient cannot get MRI because of a non-pacemaker compatible MRI status.  She will require further evaluation by a stroke neurologist, as well as hospitalist for stabilization of her medical condition.  Doubt hypertensive urgency, ACS, metabolic instability or impending vascular collapse.  Nursing Notes Reviewed/ Care Coordinated Applicable Imaging Reviewed Interpretation of Laboratory Data incorporated into ED treatment  Plan: Admit  New Prescriptions Current Discharge Medication List       Mancel Bale, MD 09/18/16 1342

## 2016-09-18 ENCOUNTER — Inpatient Hospital Stay (HOSPITAL_COMMUNITY): Payer: PPO

## 2016-09-18 ENCOUNTER — Encounter (HOSPITAL_COMMUNITY): Payer: Self-pay | Admitting: *Deleted

## 2016-09-18 ENCOUNTER — Observation Stay (HOSPITAL_BASED_OUTPATIENT_CLINIC_OR_DEPARTMENT_OTHER): Payer: PPO

## 2016-09-18 ENCOUNTER — Encounter (HOSPITAL_COMMUNITY): Payer: PPO

## 2016-09-18 DIAGNOSIS — G459 Transient cerebral ischemic attack, unspecified: Secondary | ICD-10-CM | POA: Diagnosis not present

## 2016-09-18 DIAGNOSIS — I6529 Occlusion and stenosis of unspecified carotid artery: Secondary | ICD-10-CM | POA: Diagnosis not present

## 2016-09-18 DIAGNOSIS — R519 Headache, unspecified: Secondary | ICD-10-CM

## 2016-09-18 DIAGNOSIS — R51 Headache: Secondary | ICD-10-CM | POA: Diagnosis not present

## 2016-09-18 DIAGNOSIS — M542 Cervicalgia: Secondary | ICD-10-CM | POA: Diagnosis not present

## 2016-09-18 LAB — BASIC METABOLIC PANEL
Anion gap: 9 (ref 5–15)
BUN: 15 mg/dL (ref 6–20)
CALCIUM: 9.1 mg/dL (ref 8.9–10.3)
CO2: 27 mmol/L (ref 22–32)
CREATININE: 1.08 mg/dL — AB (ref 0.44–1.00)
Chloride: 102 mmol/L (ref 101–111)
GFR calc Af Amer: 55 mL/min — ABNORMAL LOW (ref >60)
GFR, EST NON AFRICAN AMERICAN: 47 mL/min — AB (ref >60)
GLUCOSE: 137 mg/dL — AB (ref 65–99)
Potassium: 3.4 mmol/L — ABNORMAL LOW (ref 3.5–5.1)
Sodium: 138 mmol/L (ref 135–145)

## 2016-09-18 LAB — LIPID PANEL
CHOL/HDL RATIO: 4.8 ratio
CHOLESTEROL: 171 mg/dL (ref 0–200)
HDL: 36 mg/dL — AB (ref >40)
LDL Cholesterol: 87 mg/dL (ref 0–99)
Triglycerides: 239 mg/dL — ABNORMAL HIGH (ref <150)
VLDL: 48 mg/dL — AB (ref 0–40)

## 2016-09-18 LAB — TSH: TSH: 1.814 u[IU]/mL (ref 0.350–4.500)

## 2016-09-18 LAB — ECHOCARDIOGRAM COMPLETE
Height: 64 in
Weight: 2239.87 oz

## 2016-09-18 LAB — CBC
HCT: 39.4 % (ref 36.0–46.0)
Hemoglobin: 12.7 g/dL (ref 12.0–15.0)
MCH: 30.5 pg (ref 26.0–34.0)
MCHC: 32.2 g/dL (ref 30.0–36.0)
MCV: 94.5 fL (ref 78.0–100.0)
PLATELETS: 219 10*3/uL (ref 150–400)
RBC: 4.17 MIL/uL (ref 3.87–5.11)
RDW: 13.4 % (ref 11.5–15.5)
WBC: 9.4 10*3/uL (ref 4.0–10.5)

## 2016-09-18 LAB — GLUCOSE, CAPILLARY
Glucose-Capillary: 158 mg/dL — ABNORMAL HIGH (ref 65–99)
Glucose-Capillary: 140 mg/dL — ABNORMAL HIGH (ref 65–99)
Glucose-Capillary: 107 mg/dL — ABNORMAL HIGH (ref 65–99)

## 2016-09-18 MED ORDER — ASPIRIN EC 81 MG PO TBEC: 81.0000 mg | DELAYED_RELEASE_TABLET | Freq: Every day | ORAL | Status: DC

## 2016-09-18 MED ORDER — IOPAMIDOL (ISOVUE-370) INJECTION 76%
INTRAVENOUS | Status: AC
  Administered 2016-09-18: 50 mL
  Filled 2016-09-18: qty 50

## 2016-09-18 MED ORDER — ENOXAPARIN SODIUM 30 MG/0.3ML ~~LOC~~ SOLN: 30.0000 mg | SUBCUTANEOUS | Status: DC

## 2016-09-18 MED ORDER — INSULIN ASPART 100 UNIT/ML ~~LOC~~ SOLN
0.0000 [IU] | Freq: Three times a day (TID) | SUBCUTANEOUS | Status: DC
  Administered 2016-09-18: 2 [IU] via SUBCUTANEOUS
  Administered 2016-09-18: 3 [IU] via SUBCUTANEOUS

## 2016-09-18 MED ORDER — SODIUM CHLORIDE 0.9% FLUSH: 10.0000 mL | INTRAVENOUS | Status: DC | PRN

## 2016-09-18 MED ORDER — ASPIRIN 81 MG PO TBEC: 81.0000 mg | DELAYED_RELEASE_TABLET | Freq: Every day | ORAL | 0 refills | Status: AC

## 2016-09-18 MED ORDER — DIPHENHYDRAMINE HCL 25 MG PO CAPS
25.0000 mg | ORAL_CAPSULE | Freq: Once | ORAL | Status: AC
  Administered 2016-09-18: 25 mg via ORAL
  Filled 2016-09-18: qty 1

## 2016-09-18 MED ORDER — SODIUM CHLORIDE 0.9% FLUSH: 10.0000 mL | Freq: Two times a day (BID) | INTRAVENOUS | Status: DC

## 2016-09-18 MED ORDER — ATORVASTATIN CALCIUM 40 MG PO TABS
40.0000 mg | ORAL_TABLET | Freq: Every day | ORAL | Status: DC
  Administered 2016-09-18: 40 mg via ORAL
  Filled 2016-09-18: qty 1

## 2016-09-18 MED ORDER — ASPIRIN 325 MG PO TABS
325.0000 mg | ORAL_TABLET | Freq: Every day | ORAL | Status: DC
  Administered 2016-09-18: 325 mg via ORAL
  Filled 2016-09-18: qty 1

## 2016-09-18 MED ORDER — PROCHLORPERAZINE EDISYLATE 5 MG/ML IJ SOLN
10.0000 mg | Freq: Once | INTRAMUSCULAR | Status: AC
  Administered 2016-09-18: 10 mg via INTRAVENOUS
  Filled 2016-09-18: qty 2

## 2016-09-18 MED ORDER — ATORVASTATIN CALCIUM 40 MG PO TABS: 40.0000 mg | ORAL_TABLET | Freq: Every day | ORAL | 0 refills | Status: DC

## 2016-09-18 NOTE — Progress Notes (Signed)
  Transitions of Care Pharmacy Note  Plan:  Educated on DM diet, ADE atorvastatin, addition of ASA, d/c simvastatin, start BP meds on Saturday per discharge summaries. Addressed concerns regarding BP regimen - patient was taking incorrectly PTA Recommend close outpatient follow up to address medication noncompliance Inpatient follow-up: Need for DM medication --------------------------------------------- Monica Choi is an 83 y.o. female who presents with a chief complaint TIA. In anticipation of discharge, pharmacy has reviewed this patient's prior to admission medication history, as well as [facility]  Current medication indications, dosing, frequency, and notable side effects reviewed with patient. patient verbalized understanding of current inpatient medication regimen and is aware that the After Visit Summary when presented, will represent the most accurate medication list at discharge.   Monica Choi expressed concerns regarding how to take her BP medications. Her son-in-law helps her fill her pill boxes.   Assessment: Understanding of regimen: fair Understanding of indications: fair Potential of compliance: fair Barriers to Obtaining Medications: No  Patient instructed to contact inpatient pharmacy team with further questions or concerns if needed.    Time spent preparing for discharge counseling: 10 mins Time spent counseling patient: 20 mins   Thank you for allowing pharmacy to be a part of this patient's care.  Allena Katzaroline E Welles

## 2016-09-18 NOTE — Progress Notes (Signed)
ANTICOAGULATION CONSULT NOTE - Initial Consult  Pharmacy Consult for Lovenox Indication: VTE prophylaxis  Allergies  Allergen Reactions  . Morphine Nausea And Vomiting  . Sulfa Antibiotics Anaphylaxis and Rash  . Amoxicillin Other (See Comments)  . Benzonatate Other (See Comments)  . Buprenorphine Nausea And Vomiting  . Ciprofloxacin Itching and Rash  . Morphine And Related Nausea And Vomiting    Patient Measurements: Height: 5\' 4"  (162.6 cm) Weight: 140 lb (63.5 kg) IBW/kg (Calculated) : 54.7  Vital Signs: BP: 190/84 (06/13 2230) Pulse Rate: 79 (06/13 2300)  Labs:  Recent Labs  09/16/16 0054 09/17/16 1908 09/17/16 1937  HGB 12.7 13.2 13.6  HCT 37.6 40.4 40.0  PLT 217 217  --   APTT  --  28  --   LABPROT  --  12.0  --   INR  --  0.89  --   CREATININE 1.06* 1.32* 1.30*    Estimated Creatinine Clearance: 29.8 mL/min (A) (by C-G formula based on SCr of 1.3 mg/dL (H)).   Medical History: Past Medical History:  Diagnosis Date  . Anemia   . Anxiety   . Arrhythmia   . Diverticulitis   . GERD (gastroesophageal reflux disease)   . Hypertension   . Rectal prolapse   . Renal disorder   . Sinus node dysfunction (HCC)     Medications:  See electronic med rec  Assessment: 83 y.o. F presents with aphasia/tingling. Pharmacy consulted for Lovenox dosing for VTE prophylaxis. Head CT negative for acute findings.  Goal of Therapy:  VTE prevention Monitor platelets by anticoagulation protocol: Yes   Plan:  Lovenox 30mg  SQ q24h Pharmacy will sign off - please reconsult if needed  Christoper Fabianaron Guhan Bruington, PharmD, BCPS Clinical pharmacist, pager [phone] 09/18/2016,12:18 AM

## 2016-09-18 NOTE — Evaluation (Signed)
Occupational Therapy Evaluation Patient Details Name: Monica Choi MRN: [id number] DOB: 1933/08/22 Today's Date: 09/18/2016    History of Present Illness Pt is an 83 y/o female who presents with R sided weakness and numbness with slurring of speech which completely resolved upon presentation to the hospital. CT negative for acute changes. PMH significant for HTN, Anxiety, Arrhythmias, Sick Sinus Syndrome.    Clinical Impression   Pt reports she was independent with ADL PTA. Currently pt overall min guard-supervision for ADL and functional mobility. Pt presenting with balance deficits, mild RUE weakness, and impaired fine motor coordination impacting her independence and safety with ADL and functional mobility. Recommending HHOT for follow up to maximize independence and safety with ADL and functional mobility upon return home alone. Pt would benefit from continued skilled OT to address established goals.    Follow Up Recommendations  Home health OT    Equipment Recommendations  None recommended by OT    Recommendations for Other Services       Precautions / Restrictions Precautions Precautions: Fall Restrictions Weight Bearing Restrictions: No      Mobility Bed Mobility Overal bed mobility: Modified Independent Bed Mobility: Supine to Sit;Sit to Supine     Supine to sit: Modified independent (Device/Increase time) Sit to supine: Modified independent (Device/Increase time)   General bed mobility comments: HOB flat without use of bed rails. Increased time required.  Transfers Overall transfer level: Needs assistance Equipment used: None Transfers: Sit to/from Stand Sit to Stand: Min guard;Supervision         General transfer comment: Min guard with initial sit to stand from EOB, supervision from [facility]    Balance Overall balance assessment: Needs assistance Sitting-balance support: Feet supported;No upper extremity supported Sitting balance-Leahy Scale: Good      Standing balance support: No upper extremity supported;During functional activity Standing balance-Leahy Scale: Fair                             ADL either performed or assessed with clinical judgement   ADL Overall ADL's : Needs assistance/impaired Eating/Feeding: Set up;Sitting   Grooming: Min guard;Standing;Wash/dry hands   Upper Body Bathing: Set up;Supervision/ safety;Sitting   Lower Body Bathing: Min guard;Sit to/from stand   Upper Body Dressing : Set up;Supervision/safety;Sitting   Lower Body Dressing: Min guard;Sit to/from stand Lower Body Dressing Details (indicate cue type and reason): able to adjust socks in sitting Toilet Transfer: Min guard;Ambulation;BSC   Toileting- Architect and Hygiene: Supervision/safety;Sit to/from stand       Functional mobility during ADLs: Min guard       Vision Baseline Vision/History: Wears glasses Wears Glasses: Reading only Patient Visual Report: No change from baseline Additional Comments: Appears WFL.     Perception     Praxis      Pertinent Vitals/Pain Pain Assessment: No/denies pain     Hand Dominance Left   Extremity/Trunk Assessment Upper Extremity Assessment Upper Extremity Assessment: RUE deficits/detail RUE Deficits / Details: slight decrease in strength vs L side. Grossly 4+/5 RUE Coordination: decreased fine motor   Lower Extremity Assessment Lower Extremity Assessment: Defer to PT evaluation   Cervical / Trunk Assessment Cervical / Trunk Assessment: Other exceptions Cervical / Trunk Exceptions: Forward head posture with rounded shoulders   Communication Communication Communication: Expressive difficulties (word finding difficulties at times)   Cognition Arousal/Alertness: Awake/alert Behavior During Therapy: WFL for tasks assessed/performed Overall Cognitive Status: Within Functional Limits for tasks assessed  General  Comments       Exercises     Shoulder Instructions      Home Living Family/patient expects to be discharged to:: Private residence Living Arrangements: Alone Available Help at Discharge: Family;Available PRN/intermittently Type of Home: House Home Access: Level entry     Home Layout: One level     Bathroom Shower/Tub: Walk-in shower;Tub only   Bathroom Toilet: Handicapped height     Home Equipment: Shower seat - built in;Cane - single point;Walker - 2 wheels;Hand held shower head;Grab bars - tub/shower   Additional Comments: pt reports family lives near by but cannot be with her 24/7  Lives With: Alone    Prior Functioning/Environment Level of Independence: Independent                 OT Problem List: Decreased strength;Impaired balance (sitting and/or standing);Decreased coordination;Decreased knowledge of use of DME or AE      OT Treatment/Interventions: Self-care/ADL training;Therapeutic exercise;Energy conservation;DME and/or AE instruction;Therapeutic activities;Patient/family education;Balance training    OT Goals(Current goals can be found in the care plan section) Acute Rehab OT Goals Patient Stated Goal: Home at d/c - back to PLOF OT Goal Formulation: With patient Time For Goal Achievement: 10/02/16 Potential to Achieve Goals: Good ADL Goals Pt Will Perform Grooming: with modified independence;standing (x3 tasks) Pt Will Perform Upper Body Bathing: with modified independence;sitting Pt Will Perform Lower Body Bathing: with modified independence;sit to/from stand Pt Will Transfer to Toilet: with modified independence;ambulating;regular height toilet Pt Will Perform Toileting - Clothing Manipulation and hygiene: with modified independence;sit to/from stand Pt Will Perform Tub/Shower Transfer: Shower transfer;with modified independence;ambulating;shower seat Pt/caregiver will Perform Home Exercise Program: Right Upper extremity;Increased strength;With  written HEP provided (increase fine motor coordination)  OT Frequency: Min 2X/week   Barriers to D/C: Decreased caregiver support  pt lives alone       Co-evaluation              AM-PAC PT "6 Clicks" Daily Activity     Outcome Measure Help from another person eating meals?: None Help from another person taking care of personal grooming?: A Little Help from another person toileting, which includes using toliet, bedpan, or urinal?: A Little Help from another person bathing (including washing, rinsing, drying)?: A Little Help from another person to put on and taking off regular upper body clothing?: A Little Help from another person to put on and taking off regular lower body clothing?: A Little 6 Click Score: 19   End of Session    Activity Tolerance: Patient tolerated treatment well Patient left: in bed;with call bell/phone within reach (with transport present to take to test)  OT Visit Diagnosis: Unsteadiness on feet (R26.81);Muscle weakness (generalized) (M62.81)                Time: [id number] OT Time Calculation (min): 17 min Charges:  OT General Charges $OT Visit: 1 Procedure OT Evaluation $OT Eval Moderate Complexity: 1 Procedure G-Codes: OT G-codes **NOT FOR INPATIENT CLASS** Functional Assessment Tool Used: Clinical judgement Functional Limitation: Self care Self Care Current Status ([id number]): At least 20 percent but less than 40 percent impaired, limited or restricted Self Care Goal Status (J1914(G8988): At least 1 percent but less than 20 percent impaired, limited or restricted   Fredric MareBailey A. Brett Albinooffey, M.S., OTR/L Pager: [phone]  Gaye AlkenBailey A Aayushi Solorzano 09/18/2016, 3:55 PM

## 2016-09-18 NOTE — Progress Notes (Signed)
Pt went down to CT A and back on unit now. Vs stable no acute distress.

## 2016-09-18 NOTE — Progress Notes (Signed)
  Echocardiogram 2D Echocardiogram has been performed.  Pieter PartridgeBrooke S Randy Castrejon 09/18/2016, 4:19 PM

## 2016-09-18 NOTE — ED Notes (Signed)
Neurologist at bedside. 

## 2016-09-18 NOTE — Care Management Obs Status (Signed)
MEDICARE OBSERVATION STATUS NOTIFICATION   Patient Details  Name: Monica CageBarbara Ramcharan MRN: [id number] Date of Birth: 02/11/1934   Medicare Observation Status Notification Given:  Yes    Kermit BaloKelli F Nycole Kawahara, RN 09/18/2016, 12:41 PM

## 2016-09-18 NOTE — Progress Notes (Signed)
STROKE TEAM PROGRESS NOTE  SUBJECTIVE (INTERVAL HISTORY) Had discussion with family and patient, we had radiology research and patient cannot have MRI. Likely TIA.   OBJECTIVE Temp:  [97.3 F (36.3 C)-98.2 F (36.8 C)] 97.9 F (36.6 C) (06/14 1100) Pulse Rate:  [58-79] 61 (06/14 1300) Cardiac Rhythm: Atrial paced (06/14 0701) Resp:  [11-23] 16 (06/14 1300) BP: (135-222)/(48-101) 165/50 (06/14 1300) SpO2:  [92 %-98 %] 94 % (06/14 1300) FiO2 (%):  [0 %] 0 % (06/13 2340) Weight:  [63.5 kg (139 lb 15.9 oz)-63.5 kg (140 lb)] 63.5 kg (139 lb 15.9 oz) (06/14 0130)  CBC:  Recent Labs Lab 09/17/16 1908 09/17/16 1937 09/18/16 0959  WBC 10.5  --  9.4  NEUTROABS 6.5  --   --   HGB 13.2 13.6 12.7  HCT 40.4 40.0 39.4  MCV 95.5  --  94.5  PLT 217  --  219    Basic Metabolic Panel:  Recent Labs Lab 09/17/16 1908 09/17/16 1937 09/18/16 0959  NA 138 139 138  K 4.0 3.9 3.4*  CL 104 103 102  CO2 24  --  27  GLUCOSE 131* 131* 137*  BUN 18 20 15   CREATININE 1.32* 1.30* 1.08*  CALCIUM 9.5  --  9.1    Lipid Panel:    Component Value Date/Time   CHOL 171 09/18/2016 0206   TRIG 239 (H) 09/18/2016 0206   HDL 36 (L) 09/18/2016 0206   CHOLHDL 4.8 09/18/2016 0206   VLDL 48 (H) 09/18/2016 0206   LDLCALC 87 09/18/2016 0206   HgbA1c: No results found for: HGBA1C Urine Drug Screen:    Component Value Date/Time   LABOPIA NONE DETECTED 09/17/2016 2145   COCAINSCRNUR NONE DETECTED 09/17/2016 2145   LABBENZ NONE DETECTED 09/17/2016 2145   AMPHETMU NONE DETECTED 09/17/2016 2145   THCU NONE DETECTED 09/17/2016 2145   LABBARB NONE DETECTED 09/17/2016 2145    Alcohol Level     Component Value Date/Time   ETH <5 09/17/2016 1908    IMAGING  Ct Head Wo Contrast 09/17/2016 1. No acute intracranial findings are identified. 2. Periventricular white matter and corona radiata hypodensities favor chronic ischemic microvascular white matter disease. 3. Atherosclerosis.   Ct Angio Head W Or  Wo Contrast Ct Angio Neck W Or Wo Contrast 09/18/2016 1. No acute arterial finding.  No evidence of infarct. 2. Cervical carotid atherosclerosis without flow limiting stenosis or ulceration. 3. Intracranial atherosclerosis primarily affecting the vertebral and carotid arteries. Moderate left paraclinoid ICA stenosis.   Ct C-spine No Charge 09/18/2016 1. No acute or aggressive finding. 2. Diffuse facet arthropathy with multilevel mild anterolisthesis. Facet hypertrophy causes moderate foraminal narrowing on the right at C3-4 and bilaterally at C4-5.    PHYSICAL EXAM   ASSESSMENT/PLAN [name] is a 83 y.o. female with history of hypertension, hyperlipidemia, cardiac dysrhythmia with pacemaker and chronic kidney disease presenting with transient speech difficulty and right-sided weakness. She did not receive IV t-PA due to deficits resolved.   TIA   CT head no acute finding. PV and white matter disease.  CTA H&N no acute finding. No infarct. Intracranial atherosclerosis. Mod L paraclinoid ICA stenosis.  Had radiology research, cannot have MRI  MRI pacer  CT CSpine no acute finding  2D Echo  No thrombus no pfo  LDL 87  HgbA1c 8.8  Lovenox 30 mg sq daily for VTE prophylaxis  Diet Carb Modified Fluid consistency: Thin; Room service appropriate? Yes  No antithrombotic prior to admission, now  on aspirin 81 mg daily  Patient counseled to be compliant with her antithrombotic medications  Ongoing aggressive stroke risk factor management  Therapy recommendations:  HH OT and PT  Disposition:  Return home  Hypertension  Elevated on arrival, improving Permissive hypertension (OK if < 220/120) but gradually normalize in 5-7 days Long-term BP goal normotensive  Hyperlipidemia  Home meds:  zocor 10, resumed in hospital then changed to lipitor 40  LDL 87, goal < 70  Continue statin at discharge  Diabetes, type II  HgbA1c 8.8, goal < 7.0  Other Stroke Risk  Factors  Advanced age  Other Active Problems  Cognitive deficits  HAs, chronic  Acute on CKI  Chronic shoulder pain  GERD  Gout   depression  Hospital day # 1  Personally examined patient and images, and have participated in and made any corrections needed to history, physical, neuro exam,assessment and plan as stated above.  I have personally obtained the history, evaluated lab date, reviewed imaging studies and agree with radiology interpretations.   Stroke will sign off at this time.  Naomie Dean, MD Stroke Neurology   .To contact Stroke Continuity provider, please refer to [email or web address]. After hours, contact General Neurology

## 2016-09-18 NOTE — Progress Notes (Signed)
Family Medicine Teaching Service Daily Progress Note Intern Pager: [phone]  Patient name: Monica Choi Medical record number: [id number] Date of birth: [birth year] Age: 83 y.o. Gender: female  Primary Care Provider: Katherina Mires, MD Consultants: Neurology Code Status: Full   Assessment and Plan: Monica Choi is a 83 y.o. female wit a past medical history significant for presenting HTN, T2DM, CKD3, AV block 2 w/ pacemaker, gout, HLD, depression, memory decline with presented with right UE weakness, right sided facial droop and slurred speech with quick resolution of symptoms concerning for TIA .   #Right arm weakness, right sided facial droop and slurred speech, acute, resolved Patient was seen by neurology yesterday who agree that patient had a TIA likely in the left MCA territory. Neurology recommended CT angiogram of head and neck which showed cervical carotid atherosclerosis and intracranial atherosclerosis primarily affecting the vertebral and carotid arteries. Lipid panel showed cholesterol 171, HDL 36, LDL 87,Triglycerides  239. TSH is 1.814. --Follow up on A1c  --Follw up on Carotid Doppler --Permissive HTN  --Follow up-Echocardiogram --Aspirin 325 mg  --Follow up on am BMP and CBC --Neuro check q2 --Fall precautions --PT/OT consult  #Headaches, chronic, moderate/severe Less likely to be temporal arteritis given mildly elevated ESR. Most likely secondary to elevated BP. Could be secondary to intracranial process though imaging findings have been unremarkable. C spine showed facet hypertrophy causing moderate foraminal narrowing on the right at C3-4 and bilaterally at C4-5.   --Acetaminophen 650 mg q6 prn  #Hypertension, chronic This am BP is 162/54. Will continue permissive hypertensive in the setting of recent TIA. --Will hold Carvedilol 12.5 mg bid  --Will hold Ramipril 10 mg bid --Follow up on Neuro recs, restart when clinically  appropriate   #Acute on Chromic  kidney injury Patient with a history of CKD 3. Baseline creatinine ~1.1. On admission Creatinine is 1.3. Slight increase likely secondary to decrease po intake. --Follow up on am BMP  --will continue Cholecalciferol  --Will continue Sodium Bicarbonate --Encourage po fluid   #Shoulder pain, chronic Could be rotator cuff strain vs C3-C4 and C4-C5  arthritic changes in the cervical region seen on imaging radiating to her shoulder -- Physical therapy will be important to ensure patient does not develop adhesive capsulitis  --Will continue to monitor --Acetaminophen 650 mg q6 prn as needed  #T2DM, uncontrolled Patient has T2DM but does not appear to be on any medications. Last A1c was 7.7 on 05/09/2016. --Follow up on A1c --Moderate SSI  --CBG qac/hs --Follow up with PCP  #GERD Well controlled on home regimen --Will continue pantoprazole 20 mg daily   #Depression, chronic, stable Patient with a history of depression and anxiety previously on Sertraline and Aricept. Patient recently move to the area from [place] after loss of husband and decrease independence. PHQ-9 score was 7 on 05/09/2016.  --Will continue Duloxetine 30 mg bid  #Cognitive decline Patient's family reports increasing changes in mood and behaviors with concern for early sign of dementia. MMSE score at PCP was 26. CT head was scheduled  By PCP for further evaluation of memory and mood changes. CT showed periventricular white matter and corona radiata hypodensities favor chronic ischemic microvascular white matter disease.  --Will defer to PCP for continued monitoring  #Gout Well controlled with home regimen --Will continue Allopurinol 100 mg daily  #Hyperlipidemia --Continue Simvastatin 10 mg daily  FEN/GI: Carb modified  PPx: Lovenox  Disposition: Home pending TIA work up   Subjective:  Patient's headaches are better this morning.  Patient was very sleepy but able to answer questions and follow  commands.  Objective: Temp:  [97.3 F (36.3 C)-98.1 F (36.7 C)] 97.3 F (36.3 C) (06/14 0700) Pulse Rate:  [60-79] 63 (06/14 0700) Resp:  [11-23] 18 (06/14 0700) BP: (139-222)/(58-101) 189/71 (06/14 0700) SpO2:  [92 %-98 %] 93 % (06/14 0700) FiO2 (%):  [0 %] 0 % (06/13 2340) Weight:  [139 lb 15.9 oz (63.5 kg)-140 lb (63.5 kg)] 139 lb 15.9 oz (63.5 kg) (06/14 0130)  Physical Exam: General: Elderly woman appear to be in some discomfort still pleasant, able to participate in exam and answer questions appropriately Cardiac: RRR, normal heart sounds, systolic murmur appreciated at the right sternal border. 2+ radial and PT pulses bilaterally Respiratory: CTAB, normal effort, No wheezes, mild crackles noted at the bases Abdomen: soft, nontender, nondistended, no hepatic or splenomegaly, +BS Extremities: no edema or cyanosis. WWP. Skin: warm and dry, no rashes noted Neuro: alert and oriented x4, [place] 5/5 Upper and lower extremities bilaterally, sensation intact UE and LE bilaterlally. Decrease ROM right arm 2/2 to shoulder pain. Finger to nose difficult to assess in right arm, intact in left arm, no dysarthria noted. Unable to perform romberg given right shoulder pain. Psych: Normal affect and mood  Laboratory:  Recent Labs Lab 09/16/16 0054 09/17/16 1908 09/17/16 1937  WBC 10.2 10.5  --   HGB 12.7 13.2 13.6  HCT 37.6 40.4 40.0  PLT 217 217  --     Recent Labs Lab 09/16/16 0054 09/17/16 1908 09/17/16 1937  NA 139 138 139  K 3.5 4.0 3.9  CL 103 104 103  CO2 26 24  --   BUN '12 18 20  '$ CREATININE 1.06* 1.32* 1.30*  CALCIUM 9.3 9.5  --   PROT 6.5 6.5  --   BILITOT 0.5 0.6  --   ALKPHOS 87 91  --   ALT 25 25  --   AST 32 32  --   GLUCOSE 176* 131* 131*   CBC Latest Ref Rng & Units 09/17/2016 09/17/2016 09/16/2016  WBC 4.0 - 10.5 K/uL - 10.5 10.2  Hemoglobin 12.0 - 15.0 g/dL 13.6 13.2 12.7  Hematocrit 36.0 - 46.0 % 40.0 40.4 37.6  Platelets 150 - 400 K/uL - 217 217    Lipid Panel     Component Value Date/Time   CHOL 171 09/18/2016 0206   TRIG 239 (H) 09/18/2016 0206   HDL 36 (L) 09/18/2016 0206   CHOLHDL 4.8 09/18/2016 0206   VLDL 48 (H) 09/18/2016 0206   LDLCALC 87 09/18/2016 0206    Imaging/Diagnostic Tests:  Ct Angio Head W Or Wo Contrast   IMPRESSION: 1. No acute arterial finding.  No evidence of infarct. 2. Cervical carotid atherosclerosis without flow limiting stenosis or ulceration. 3. Intracranial atherosclerosis primarily affecting the vertebral and carotid arteries. Moderate left paraclinoid ICA stenosis. Electronically Signed   By: Monte Fantasia M.D.   On: 09/18/2016 08:21   Ct Head Wo Contrast  IMPRESSION: 1. No acute intracranial findings are identified. 2. Periventricular white matter and corona radiata hypodensities favor chronic ischemic microvascular white matter disease. 3. Atherosclerosis. Electronically Signed   By: Van Clines M.D.   On: 09/17/2016 19:55   Ct Angio Neck W Or Wo Contrast  IMPRESSION: 1. No acute arterial finding.  No evidence of infarct. 2. Cervical carotid atherosclerosis without flow limiting stenosis or ulceration. 3. Intracranial atherosclerosis primarily affecting the vertebral and carotid arteries. Moderate left paraclinoid ICA stenosis. Electronically Signed  By: Monte Fantasia M.D.   On: 09/18/2016 08:21   Ct C-spine No Charge  IMPRESSION: 1. No acute or aggressive finding. 2. Diffuse facet arthropathy with multilevel mild anterolisthesis. Facet hypertrophy causes moderate foraminal narrowing on the right at C3-4 and bilaterally at C4-5. Electronically Signed   By: Monte Fantasia M.D.   On: 09/18/2016 08:27    Marjie Skiff, MD 09/18/2016, 8:41 AM PGY-1, [place] Intern pager: [phone], text pages welcome

## 2016-09-18 NOTE — Progress Notes (Signed)
Patient is discharged from room 5M17 at this time. Alert and in stable condition. IV site d/c'd as well as tele. Instructions read to patient and family with understanding verbalized. Left unit via wheelchair with all belongings at side.

## 2016-09-18 NOTE — Consult Note (Signed)
Admission H&P    Chief Complaint: Transient speech difficulty and right-sided weakness.  HPI: Monica Choi is an 83 y.o. female with a history of hypertension, hyperlipidemia, cardiac dysrhythmia with pacemaker and chronic kidney disease brought to the ED following an episode of slurring of speech and subsequent inability to speak, as well as weakness and numbness of right face and upper extremity. Symptoms started at about 5:30 PM today and lasted about 20 minutes. Symptoms resolved completely. She has no history of stroke or TIA. CT scan of her head showed no acute intracranial abnormality. Only other significant complaint is frequent headaches and visual changes with blurred vision, left eye greater than right. Sedimentation rate was obtained tonight which was 38. She was recently given a trial of steroids which had no clear effect on significantly reducing her headaches.  LSN: 5:30 PM on 09/17/2016 tPA Given: No: Acute deficits resolved rapidly mRankin:  Past Medical History:  Diagnosis Date  . Anemia   . Anxiety   . Arrhythmia   . Diverticulitis   . GERD (gastroesophageal reflux disease)   . Hypertension   . Rectal prolapse   . Renal disorder   . Sinus node dysfunction [facility])     Past Surgical History:  Procedure Laterality Date  . ABDOMINAL HYSTERECTOMY    . ABDOMINAL SURGERY    . CARDIAC SURGERY    . CATARACT EXTRACTION Bilateral   . CHOLECYSTECTOMY    . COLON SURGERY    . HERNIA REPAIR    . KNEE SURGERY    . NASAL SINUS SURGERY      Family History  Problem Relation Age of Onset  . Heart attack Father   . Heart attack Brother    Social History:  reports that she has quit smoking. She has never used smokeless tobacco. Her alcohol and drug histories are not on file.  Allergies:  Allergies  Allergen Reactions  . Morphine Nausea And Vomiting  . Sulfa Antibiotics Anaphylaxis and Rash  . Amoxicillin Other (See Comments)  . Benzonatate Other (See Comments)  .  Buprenorphine Nausea And Vomiting  . Ciprofloxacin Itching and Rash  . Morphine And Related Nausea And Vomiting    Medications: Preadmission medications were reviewed by me.  ROS: History obtained from patient, patient's son and patient's daughter-in-law.  General ROS: Positive for night sweats Psychological ROS: negative for - behavioral disorder, hallucinations, memory difficulties, mood swings or suicidal ideation Ophthalmic ROS: Blurred vision associated with headaches ENT ROS: negative for - epistaxis, nasal discharge, oral lesions, sore throat, tinnitus or vertigo Allergy and Immunology ROS: negative for - hives or itchy/watery eyes Hematological and Lymphatic ROS: negative for - bleeding problems, bruising or swollen lymph nodes Endocrine ROS: negative for - galactorrhea, hair pattern changes, polydipsia/polyuria or temperature intolerance Respiratory ROS: negative for - cough, hemoptysis, shortness of breath or wheezing Cardiovascular ROS: negative for - chest pain, dyspnea on exertion, edema or irregular heartbeat Gastrointestinal ROS: negative for - abdominal pain, diarrhea, hematemesis, nausea/vomiting or stool incontinence Genito-Urinary ROS: negative for - dysuria, hematuria, incontinence or urinary frequency/urgency Musculoskeletal ROS: negative for - joint swelling or muscular weakness Neurological ROS: as noted in HPI Dermatological ROS: negative for rash and skin lesion changes  Physical Examination: Blood pressure (!) 190/84, pulse 79, resp. rate (!) 23, height _0  (1.626 m), weight 63.5 kg (140 lb), SpO2 93 %.  HEENT-  Normocephalic, no lesions, without obvious abnormality.  Normal external eye and conjunctiva.  Normal TM's bilaterally.  Normal auditory canals and  external ears. Normal external nose, mucus membranes and septum.  Normal pharynx. Neck supple with no masses, nodes, nodules or enlargement. Cardiovascular - regular rate and rhythm, S1, S2 normal, no  murmur, click, rub or gallop Lungs - chest clear, no wheezing, rales, normal symmetric air entry Abdomen - soft, non-tender; bowel sounds normal; no masses,  no organomegaly Extremities - no joint deformities, effusion, or inflammation  Neurologic Examination: Mental Status: Alert, oriented, complaining of headache and nausea.  Speech fluent without evidence of aphasia. Able to follow commands without difficulty. Cranial Nerves: II-Visual fields were normal. III/IV/VI-Pupils were equal and reacted normally to light. Extraocular movements were full and conjugate.    V/VII-no facial numbness and no facial weakness. VIII-normal. X-normal speech and symmetrical palatal movement. Motor: 5/5 bilaterally with normal tone and bulk Sensory: Normal throughout. Deep Tendon Reflexes: 1+ and symmetric. Plantars: Flexor bilaterally Cerebellar: Normal finger-to-nose testing of left upper extremity; unable to adequately test right upper extremity due to shoulder pain with movement. Carotid auscultation: Normal  Results for orders placed or performed during the hospital encounter of 09/17/16 (from the past 48 hour(s))  Ethanol     Status: None   Collection Time: 09/17/16  7:08 PM  Result Value Ref Range   Alcohol, Ethyl (B) <5 <5 mg/dL    Comment:        LOWEST DETECTABLE LIMIT FOR SERUM ALCOHOL IS 5 mg/dL FOR MEDICAL PURPOSES ONLY   Protime-INR     Status: None   Collection Time: 09/17/16  7:08 PM  Result Value Ref Range   Prothrombin Time 12.0 11.4 - 15.2 seconds   INR 0.89   APTT     Status: None   Collection Time: 09/17/16  7:08 PM  Result Value Ref Range   aPTT 28 24 - 36 seconds  CBC     Status: None   Collection Time: 09/17/16  7:08 PM  Result Value Ref Range   WBC 10.5 4.0 - 10.5 K/uL   RBC 4.23 3.87 - 5.11 MIL/uL   Hemoglobin 13.2 12.0 - 15.0 g/dL   HCT 40.4 36.0 - 46.0 %   MCV 95.5 78.0 - 100.0 fL   MCH 31.2 26.0 - 34.0 pg   MCHC 32.7 30.0 - 36.0 g/dL   RDW 13.3 11.5 - 15.5  %   Platelets 217 150 - 400 K/uL  Differential     Status: None   Collection Time: 09/17/16  7:08 PM  Result Value Ref Range   Neutrophils Relative % 61 %   Neutro Abs 6.5 1.7 - 7.7 K/uL   Lymphocytes Relative 29 %   Lymphs Abs 3.0 0.7 - 4.0 K/uL   Monocytes Relative 6 %   Monocytes Absolute 0.6 0.1 - 1.0 K/uL   Eosinophils Relative 3 %   Eosinophils Absolute 0.3 0.0 - 0.7 K/uL   Basophils Relative 1 %   Basophils Absolute 0.1 0.0 - 0.1 K/uL  Comprehensive metabolic panel     Status: Abnormal   Collection Time: 09/17/16  7:08 PM  Result Value Ref Range   Sodium 138 135 - 145 mmol/L   Potassium 4.0 3.5 - 5.1 mmol/L   Chloride 104 101 - 111 mmol/L   CO2 24 22 - 32 mmol/L   Glucose, Bld 131 (H) 65 - 99 mg/dL   BUN 18 6 - 20 mg/dL   Creatinine, Ser 1.32 (H) 0.44 - 1.00 mg/dL   Calcium 9.5 8.9 - 10.3 mg/dL   Total Protein 6.5 6.5 - 8.1  g/dL   Albumin 3.6 3.5 - 5.0 g/dL   AST 32 15 - 41 U/L   ALT 25 14 - 54 U/L   Alkaline Phosphatase 91 38 - 126 U/L   Total Bilirubin 0.6 0.3 - 1.2 mg/dL   GFR calc non Af Amer 37 (L) >60 mL/min   GFR calc Af Amer 43 (L) >60 mL/min    Comment: (NOTE) The eGFR has been calculated using the CKD EPI equation. This calculation has not been validated in all clinical situations. eGFR's persistently <60 mL/min signify possible Chronic Kidney Disease.    Anion gap 10 5 - 15  Sedimentation rate     Status: Abnormal   Collection Time: 09/17/16  7:08 PM  Result Value Ref Range   Sed Rate 38 (H) 0 - 22 mm/hr  CBG monitoring, ED     Status: Abnormal   Collection Time: 09/17/16  7:14 PM  Result Value Ref Range   Glucose-Capillary 130 (H) 65 - 99 mg/dL  I-stat troponin, ED (not at [facility], [facility])     Status: None   Collection Time: 09/17/16  7:35 PM  Result Value Ref Range   Troponin i, poc 0.01 0.00 - 0.08 ng/mL   Comment 3            Comment: Due to the release kinetics of cTnI, a negative result within the first hours of the onset of symptoms does not  rule out myocardial infarction with certainty. If myocardial infarction is still suspected, repeat the test at appropriate intervals.   I-Stat Chem 8, ED  (not at [facility], [facility])     Status: Abnormal   Collection Time: 09/17/16  7:37 PM  Result Value Ref Range   Sodium 139 135 - 145 mmol/L   Potassium 3.9 3.5 - 5.1 mmol/L   Chloride 103 101 - 111 mmol/L   BUN 20 6 - 20 mg/dL   Creatinine, Ser 1.30 (H) 0.44 - 1.00 mg/dL   Glucose, Bld 131 (H) 65 - 99 mg/dL   Calcium, Ion 1.14 (L) 1.15 - 1.40 mmol/L   TCO2 26 0 - 100 mmol/L   Hemoglobin 13.6 12.0 - 15.0 g/dL   HCT 40.0 36.0 - 46.0 %  Urine rapid drug screen (hosp performed)not at [facility]     Status: None   Collection Time: 09/17/16  9:45 PM  Result Value Ref Range   Opiates NONE DETECTED NONE DETECTED   Cocaine NONE DETECTED NONE DETECTED   Benzodiazepines NONE DETECTED NONE DETECTED   Amphetamines NONE DETECTED NONE DETECTED   Tetrahydrocannabinol NONE DETECTED NONE DETECTED   Barbiturates NONE DETECTED NONE DETECTED    Comment:        DRUG SCREEN FOR MEDICAL PURPOSES ONLY.  IF CONFIRMATION IS NEEDED FOR ANY PURPOSE, NOTIFY LAB WITHIN 5 DAYS.        LOWEST DETECTABLE LIMITS FOR URINE DRUG SCREEN Drug Class       Cutoff (ng/mL) Amphetamine      1000 Barbiturate      200 Benzodiazepine   341 Tricyclics       962 Opiates          300 Cocaine          300 THC              50   Urinalysis, Routine w reflex microscopic     Status: Abnormal   Collection Time: 09/17/16  9:45 PM  Result Value Ref Range   Color, Urine STRAW (A) YELLOW  APPearance CLEAR CLEAR   Specific Gravity, Urine 1.008 1.005 - 1.030   pH 7.0 5.0 - 8.0   Glucose, UA NEGATIVE NEGATIVE mg/dL   Hgb urine dipstick NEGATIVE NEGATIVE   Bilirubin Urine NEGATIVE NEGATIVE   Ketones, ur NEGATIVE NEGATIVE mg/dL   Protein, ur 100 (A) NEGATIVE mg/dL   Nitrite NEGATIVE NEGATIVE   Leukocytes, UA NEGATIVE NEGATIVE   RBC / HPF 0-5 0 - 5 RBC/hpf   WBC, UA 0-5 0 - 5 WBC/hpf    Bacteria, UA NONE SEEN NONE SEEN   Squamous Epithelial / LPF 0-5 (A) NONE SEEN   Ct Head Wo Contrast  Result Date: 09/17/2016 CLINICAL DATA:  Right arm tingling and expressive aphasia onset. History of dementia. EXAM: CT HEAD WITHOUT CONTRAST TECHNIQUE: Contiguous axial images were obtained from the base of the skull through the vertex without intravenous contrast. COMPARISON:  08/30/2016 FINDINGS: Brain: The brainstem, cerebellum, cerebral peduncles, thalami, basal ganglia, basilar cisterns, and ventricular system appear within normal limits. Periventricular white matter and corona radiata hypodensities favor chronic ischemic microvascular white matter disease. No intracranial hemorrhage, mass lesion, or acute CVA. Vascular: Atherosclerotic calcification of the vertebral arteries and cavernous carotid arteries. Skull: Unremarkable Sinuses/Orbits: Hypoplastic maxillary sinuses with prior antrectomies. Other: No supplemental non-categorized findings. IMPRESSION: 1. No acute intracranial findings are identified. 2. Periventricular white matter and corona radiata hypodensities favor chronic ischemic microvascular white matter disease. 3. Atherosclerosis. Electronically Signed   By: Van Clines M.D.   On: 09/17/2016 19:55    Assessment: 83 y.o. female with multiple risk factors for stroke presenting with probable left MCA territory TIA. Etiology of headaches and visual changes is unclear. Temporal arteritis is unlikely with sedimentation rate of only 38, and no clear response to treatment with steroids. Degenerative disease of cervical spine may be a contributing factor in causing patient's recurrent headaches.  Stroke Risk Factors - family history, hyperlipidemia and hypertension  Plan: 1. HgbA1c, fasting lipid panel 2. CT angiogram of head and neck with contrast 3. Echocardiogram 4. Prophylactic therapy-Antiplatelet med: Aspirin  5. Risk factor modification 6. Telemetry monitoring 7.  Cervical spine x-rays to rule out degenerative changes  C.R. Nicole Kindred, MD [facility] [phone]  09/18/2016, 12:36 AM

## 2016-09-18 NOTE — Evaluation (Signed)
Physical Therapy Evaluation Patient Details Name: Monica CageBarbara Choi MRN: [id number] DOB: 01/19/1934 Today's Date: 09/18/2016   History of Present Illness  Pt is an 83 y/o female who presents with R sided weakness and numbness with slurring of speech which completely resolved upon presentation to the hospital. CT negative for acute changes. PMH significant for HTN, Anxiety, Arrhythmias, Sick Sinus Syndrome.   Clinical Impression  Pt admitted with above diagnosis. Pt currently with functional limitations due to the deficits listed below (see PT Problem List). At the time of PT eval pt was able to ambulate with HHA for balance support and safety. Pt will benefit from skilled PT to increase their independence and safety with mobility to allow discharge to the venue listed below.       Follow Up Recommendations Home health PT;Supervision for mobility/OOB    Equipment Recommendations  None recommended by PT    Recommendations for Other Services       Precautions / Restrictions Precautions Precautions: Fall Restrictions Weight Bearing Restrictions: No      Mobility  Bed Mobility Overal bed mobility: Needs Assistance Bed Mobility: Supine to Sit;Sit to Supine     Supine to sit: Modified independent (Device/Increase time) Sit to supine: Modified independent (Device/Increase time)   General bed mobility comments: Increased time required. Minimal rail usage however feel she could have completed bed mobility activity without rail.  Transfers Overall transfer level: Needs assistance Equipment used: None Transfers: Sit to/from Stand Sit to Stand: Supervision         General transfer comment: Close supervision for safety. HHA provided initially as pt reports feeling lightheaded. However, pt did not require HHA to gain/maintain standing balance.   Ambulation/Gait Ambulation/Gait assistance: Min guard Ambulation Distance (Feet): 120 Feet Assistive device: 1 person hand held assist Gait  Pattern/deviations: Step-through pattern;Decreased stride length;Trunk flexed Gait velocity: Decreased Gait velocity interpretation: Below normal speed for age/gender General Gait Details: HHA provided for balance support and safety. Pt was able to ambulate well with no large losses of balance noted.   Stairs            Wheelchair Mobility    Modified Rankin (Stroke Patients Only) Modified Rankin (Stroke Patients Only) Pre-Morbid Rankin Score: No symptoms Modified Rankin: No significant disability     Balance Overall balance assessment: Needs assistance Sitting-balance support: Feet supported;No upper extremity supported Sitting balance-Leahy Scale: Fair     Standing balance support: No upper extremity supported;During functional activity Standing balance-Leahy Scale: Poor                               Pertinent Vitals/Pain Pain Assessment: No/denies pain    Home Living Family/patient expects to be discharged to:: Private residence Living Arrangements: Alone Available Help at Discharge: Family;Available 24 hours/day Type of Home: House Home Access: Level entry     Home Layout: One level Home Equipment: Shower seat - built in;Cane - single point;Walker - 2 wheels;Hand held shower head;Grab bars - tub/shower      Prior Function Level of Independence: Independent               Hand Dominance   Dominant Hand: Left    Extremity/Trunk Assessment   Upper Extremity Assessment Upper Extremity Assessment: Defer to OT evaluation    Lower Extremity Assessment Lower Extremity Assessment: Generalized weakness    Cervical / Trunk Assessment Cervical / Trunk Assessment: Other exceptions Cervical / Trunk Exceptions: Forward head posture  with rounded shoulders  Communication   Communication: HOH  Cognition Arousal/Alertness: Awake/alert (initially had difficulty waking up but was alert for session) Behavior During Therapy: WFL for tasks  assessed/performed Overall Cognitive Status: Within Functional Limits for tasks assessed                                        General Comments      Exercises     Assessment/Plan    PT Assessment Patient needs continued PT services  PT Problem List Decreased strength;Decreased range of motion;Decreased activity tolerance;Decreased balance;Decreased mobility;Decreased knowledge of use of DME;Decreased safety awareness;Decreased knowledge of precautions       PT Treatment Interventions DME instruction;Gait training;Stair training;Functional mobility training;Therapeutic activities;Therapeutic exercise;Neuromuscular re-education;Patient/family education    PT Goals (Current goals can be found in the Care Plan section)  Acute Rehab PT Goals Patient Stated Goal: Home at d/c - back to PLOF PT Goal Formulation: With patient Time For Goal Achievement: 09/25/16 Potential to Achieve Goals: Good    Frequency Min 3X/week   Barriers to discharge        Co-evaluation               AM-PAC PT "6 Clicks" Daily Activity  Outcome Measure Difficulty turning over in bed (including adjusting bedclothes, sheets and blankets)?: A Little Difficulty moving from lying on back to sitting on the side of the bed? : A Little Difficulty sitting down on and standing up from a chair with arms (e.g., wheelchair, bedside commode, etc,.)?: A Little Help needed moving to and from a bed to chair (including a wheelchair)?: A Little Help needed walking in hospital room?: A Little Help needed climbing 3-5 steps with a railing? : A Little 6 Click Score: 18    End of Session Equipment Utilized During Treatment: Gait belt Activity Tolerance: Patient limited by fatigue Patient left: in bed;with call bell/phone within reach;with bed alarm set Nurse Communication: Mobility status PT Visit Diagnosis: Unsteadiness on feet (R26.81);Other symptoms and signs involving the nervous system  (R29.898)    Time: 4098-1191 PT Time Calculation (min) (ACUTE ONLY): 31 min   Charges:   PT Evaluation $PT Eval Moderate Complexity: 1 Procedure PT Treatments $Gait Training: 8-22 mins   PT G Codes:   PT G-Codes **NOT FOR INPATIENT CLASS** Functional Assessment Tool Used: Clinical judgement Functional Limitation: Mobility: Walking and moving around Mobility: Walking and Moving Around Current Status (Y7829): At least 40 percent but less than 60 percent impaired, limited or restricted Mobility: Walking and Moving Around Goal Status [phone]): At least 20 percent but less than 40 percent impaired, limited or restricted    Conni Slipper, PT, DPT Acute Rehabilitation Services Pager: [phone]   Marylynn Pearson 09/18/2016, 2:04 PM

## 2016-09-18 NOTE — Progress Notes (Signed)
Pt admitted from ED with stroke/TIA diagnosis, pt alert, still c/o of headache and nausea, admitted doc paged and notified, pt was however settled in bed with call light at bedside, tele monitor put and verified on pt, will continue to monitor. [place]-As[place], Detria Cummings Efe

## 2016-09-18 NOTE — Care Management CC44 (Signed)
Condition Code 44 Documentation Completed  Patient Details  Name: Monica CageBarbara Choi MRN: [id number] Date of Birth: 07/01/1933   Condition Code 44 given:  Yes Patient signature on Condition Code 44 notice:  Yes Documentation of 2 MD's agreement:  Yes Code 44 added to claim:  Yes    Kermit BaloKelli F Greely Atiyeh, RN 09/18/2016, 12:41 PM

## 2016-09-18 NOTE — Care Management Note (Signed)
Case Management Note  Patient Details  Name: Monica Choi MRN: [id number] Date of Birth: November 10, 1933  Subjective/Objective:                    Action/Plan: Pt discharging home with orders for [facility] services. CM met with the patient and her family and provided a list of [place] agencies. They selected [facility]. Santiago Glad with [facility] notified and accepted the referral.  Family to provide transport home.   Expected Discharge Date:  09/18/16               Expected Discharge Plan:  [place]  In-House Referral:     Discharge planning Services  CM Consult  Post Acute Care Choice:  Home Health Choice offered to:  Patient  DME Arranged:    DME Agency:     HH Arranged:  PT, OT HH Agency:  [place]  Status of Service:  Completed, signed off  If discussed at [place] of Stay Meetings, dates discussed:    Additional Comments:  Pollie Friar, RN 09/18/2016, 5:03 PM

## 2016-09-18 NOTE — Evaluation (Signed)
Speech Language Pathology Evaluation Patient Details Name: Monica CageBarbara Zettlemoyer MRN: [id number] DOB: 02/17/1934 Today's Date: 09/18/2016 Time: [id number] SLP Time Calculation (min) (ACUTE ONLY): 23 min  Problem List:  Patient Active Problem List   Diagnosis Date Noted  . Nonintractable headache   . TIA (transient ischemic attack) 09/17/2016  . Hypertension 07/17/2016  . Sick sinus syndrome (HCC) 07/17/2016   Past Medical History:  Past Medical History:  Diagnosis Date  . Anemia   . Anxiety   . Arrhythmia   . Diverticulitis   . GERD (gastroesophageal reflux disease)   . Hypertension   . Rectal prolapse   . Renal disorder   . Sinus node dysfunction (HCC)    Past Surgical History:  Past Surgical History:  Procedure Laterality Date  . ABDOMINAL HYSTERECTOMY    . ABDOMINAL SURGERY    . CARDIAC SURGERY    . CATARACT EXTRACTION Bilateral   . CHOLECYSTECTOMY    . COLON SURGERY    . HERNIA REPAIR    . KNEE SURGERY    . NASAL SINUS SURGERY     HPI:  Pt is an 83 y/o female who presents with R sided weakness and numbness with slurring of speech which completely resolved upon presentation to the hospital. CT negative for acute changes. PMH significant for HTN, Anxiety, Arrhythmias, Sick Sinus Syndrome.    Assessment / Plan / Recommendation Clinical Impression  Pt was resting when entering room, however patient woke easily and participated in cognitive/language evaluation. She reported that during the "event" that initiated this hospital visit she had a numb right hand and was unable to speak. Her speech has returned and no slurring is noted. Patients memory, expressive language and receptive language are all felt to be [facility] and at baseline. Speech and language therapy are not recommended at this time.     SLP Assessment  SLP Recommendation/Assessment: Patient does not need any further Speech Lanaguage Pathology Services                     SLP Evaluation Cognition  Overall  Cognitive Status: Within Functional Limits for tasks assessed Arousal/Alertness: Awake/alert Orientation Level: Oriented X4 Memory: Appears intact Problem Solving: Appears intact Safety/Judgment: Appears intact       Comprehension  Auditory Comprehension Overall Auditory Comprehension: Appears within functional limits for tasks assessed Yes/No Questions: Within Functional Limits Commands: Within Functional Limits Conversation: Complex Visual Recognition/Discrimination Discrimination: Not tested Reading Comprehension Reading Status: Within funtional limits    Expression Expression Primary Mode of Expression: Verbal Verbal Expression Overall Verbal Expression: Appears within functional limits for tasks assessed Initiation: No impairment Level of Generative/Spontaneous Verbalization: Conversation Repetition: No impairment Naming: No impairment Pragmatics: No impairment Written Expression Dominant Hand: Left Written Expression: Within Functional Limits   Oral / Motor  Oral Motor/Sensory Function Overall Oral Motor/Sensory Function: Within functional limits Motor Speech Overall Motor Speech: Appears within functional limits for tasks assessed Respiration: Within functional limits Phonation: Normal Resonance: Within functional limits Articulation: Within functional limitis Intelligibility: Intelligible Motor Planning: Witnin functional limits Motor Speech Errors: Not applicable   GO                    Lindalou HoseSarah J. Destin Vinsant, MA, CCC-SLP 09/18/2016 3:23 PM

## 2016-09-18 NOTE — Progress Notes (Signed)
OT Cancellation Note  Patient Details Name: [place] CageBarbara Rackers MRN: [id number] DOB: 07/02/1933   Cancelled Treatment:    Reason Eval/Treat Not Completed: Patient at procedure or test/ unavailable. Will follow up as time allows.  Gaye AlkenBailey A Kedarius Aloisi M.S., OTR/L Pager: [phone]  09/18/2016, 10:06 AM

## 2016-09-19 DIAGNOSIS — G43809 Other migraine, not intractable, without status migrainosus: Secondary | ICD-10-CM | POA: Diagnosis not present

## 2016-09-19 DIAGNOSIS — G459 Transient cerebral ischemic attack, unspecified: Secondary | ICD-10-CM | POA: Diagnosis not present

## 2016-09-19 LAB — HEMOGLOBIN A1C
HEMOGLOBIN A1C: 8.8 % — AB (ref 4.8–5.6)
MEAN PLASMA GLUCOSE: 206 mg/dL

## 2016-09-19 NOTE — Discharge Summary (Signed)
[facility] Discharge Summary  Patient name: Monica Choi Medical record number: [id number] Date of birth: 1933/08/21 Age: 83 y.o. Gender: female Date of Admission: 09/17/2016  Date of Discharge:09/18/2016 Admitting Physician: Lupita Dawn, MD  Primary Care Provider: Katherina Mires, MD Consultants: Neurology  Indication for Hospitalization: TIA work up   Discharge Diagnoses/Problem List:  TIA T2DM HTN CKD3 Sick Sinus Syndrome w/ pacemaker Gout HLD Depression  Disposition: Home with Home Health   Discharge Condition: Stable  Discharge Exam:   General: Elderly woman appear to be in some discomfort still pleasant, able to participate in exam and answer questions appropriately Cardiac: RRR, normal heart sounds, systolic murmur appreciated at the right sternal border. 2+ radial and PT pulses bilaterally Respiratory: CTAB, normal effort, No wheezes, mild crackles noted at the bases Abdomen: soft, nontender, nondistended, no hepatic or splenomegaly, +BS Extremities:no edema or cyanosis. WWP. Skin:warm and dry, no rashes noted Neuro: alert and oriented x4, [place]rent[place] 5/5 Upper and lower extremities bilaterally, sensation intact UE and LE bilaterlally. Decrease ROM right arm 2/2 to shoulder pain.Finger to nose difficult to assess in right arm, intact in left arm,no dysarthria noted. Unable to perform romberg given right shoulder pain. Psych:Normal affect and mood  Brief Hospital Course:  Monica Smithis a 83 y.o.femalewit a past medical history significant for presenting HTN, T2DM, CKD3, AV block 2 w/ pacemaker, gout, HLD, depression, memory declinewho presented with right UE weakness, right sided facial droop and slurred speech with quick resolution of symptoms concerning for TIA. On admission, patient had a CT head that did not show any acute intracranial abnormalities. Patient was not able to get an MRI/MRA due to incompatibility with pacemaker.  Neurology was consulted and confirmed diagnosis of TIA and recommended CT angiogram of head and neck which showed cervical carotid atherosclerosis and intracranial atherosclerosis primarily affecting the vertebral and carotid arteries. Patient also had CT C-spine which revealed facet hypertrophy causes moderate foraminal narrowing on the right at C3-4 and bilaterally at C4-5. Patient had lipid panel, TSH and A1c done as part of risk stratification and a echocardiogram. Mild AKI resolved with fluid intake. Patient continue to complain of shoulder pain chronic in nature which was controlled with pain medications with recommendations to readdress at next PCP visit.patinet also complain of severe headaches that have been going on for the past month. Headache are described as pressure behind patient eye. They seem to be related to uncontrolled BP in the setting of possible medication non adherence. Headaches could also be secondary to temporal arteritis given age, elevated ESR and occasional temporal tenderness. Further outpatient workup is warranted to determine etiology and prevent recurrence. Patient was switched to Atorvastatin 80 mg and aspirin 81 mg. She was seen by PT and OT who recommended Home Health. On discharge patient has returned to baseline, and was stable with close follow up with PCP.   Issues for Follow Up:  1. Follow up with PCP to discuss BP control, allow for permissive hypertension 48-72 hours post TIA. 2. Discuss Headaches with PCP and possible biopsy for temporal arteritis. 3. Discuss drenching night sweats, metallic taste in mouth, headache. Possibly malignancy/ inflammatory process 4. Patient started on atorvastatin 80 mg and aspirin 81 mg.  5. Discuss T2DM oral medications, no need for aggressive control, but given recent event could benefit from Metformin. Kidney function is appropriate. 6. Shoulder pain appears to be secondary to arthritic changes  Significant Procedures:  ECHO  Significant Labs and Imaging:   Recent  Labs Lab 09/16/16 0054 09/17/16 1908 09/17/16 1937 09/18/16 0959  WBC 10.2 10.5  --  9.4  HGB 12.7 13.2 13.6 12.7  HCT 37.6 40.4 40.0 39.4  PLT 217 217  --  219    Recent Labs Lab 09/16/16 0054 09/17/16 1908 09/17/16 1937 09/18/16 0959  NA 139 138 139 138  K 3.5 4.0 3.9 3.4*  CL 103 104 103 102  CO2 26 24  --  27  GLUCOSE 176* 131* 131* 137*  BUN '12 18 20 15  '$ CREATININE 1.06* 1.32* 1.30* 1.08*  CALCIUM 9.3 9.5  --  9.1  ALKPHOS 87 91  --   --   AST 32 32  --   --   ALT 25 25  --   --   ALBUMIN 3.6 3.6  --   --    Ct Angio Head W Or Wo Contrast  IMPRESSION: 1. No acute arterial finding.  No evidence of infarct. 2. Cervical carotid atherosclerosis without flow limiting stenosis or ulceration. 3. Intracranial atherosclerosis primarily affecting the vertebral and carotid arteries. Moderate left paraclinoid ICA stenosis. Electronically Signed   By: Monte Fantasia M.D.   On: 09/18/2016 08:21   Ct Head Wo Contrast  IMPRESSION: 1. No acute arterial finding.  No evidence of infarct. 2. Cervical carotid atherosclerosis without flow limiting stenosis or ulceration. 3. Intracranial atherosclerosis primarily affecting the vertebral and carotid arteries. Moderate left paraclinoid ICA stenosis. Electronically Signed   By: Monte Fantasia M.D.   On: 09/18/2016 08:21   Ct C-spine No Charge  IMPRESSION: 1. No acute or aggressive finding. 2. Diffuse facet arthropathy with multilevel mild anterolisthesis. Facet hypertrophy causes moderate foraminal narrowing on the right at C3-4 and bilaterally at C4-5. Electronically Signed   By: Monte Fantasia M.D.   On: 09/18/2016 08:27    Results/Tests Pending at Time of Discharge: A1c  Discharge Medications:  Allergies as of 09/18/2016      Reactions   Morphine Nausea And Vomiting   Sulfa Antibiotics Anaphylaxis, Rash   Amoxicillin Other (See Comments)   Benzonatate Other (See Comments)    Buprenorphine Nausea And Vomiting   Ciprofloxacin Itching, Rash   Morphine And Related Nausea And Vomiting      Medication List    STOP taking these medications   BIOTIN 5000 PO   simvastatin 10 MG tablet Commonly known as:  ZOCOR     TAKE these medications   ACETAMINOPHEN 8 HOUR 650 MG CR tablet Generic drug:  acetaminophen Take 1,300 mg by mouth every 8 (eight) hours as needed for pain.   allopurinol 100 MG tablet Commonly known as:  ZYLOPRIM Take 1 tablet by mouth daily.   aspirin 81 MG EC tablet Take 1 tablet (81 mg total) by mouth daily.   atorvastatin 40 MG tablet Commonly known as:  LIPITOR Take 1 tablet (40 mg total) by mouth daily at 6 PM.   bismuth subsalicylate 478 MG chewable tablet Commonly known as:  PEPTO BISMOL Chew 2 tablets by mouth daily as needed for indigestion or diarrhea or loose stools.   carvedilol 12.5 MG tablet Commonly known as:  COREG Take 1 tablet (12.5 mg total) by mouth 2 (two) times daily.   cetirizine 10 MG tablet Commonly known as:  ZYRTEC Take 10 mg by mouth daily.   DULoxetine 30 MG capsule Commonly known as:  CYMBALTA Take 30 mg by mouth 2 (two) times daily.   metoCLOPramide 10 MG tablet Commonly known as:  REGLAN Take  1 tablet (10 mg total) by mouth every 6 (six) hours as needed (for nausea/headache).   nitroGLYCERIN 0.4 MG SL tablet Commonly known as:  NITROSTAT Place 0.4 mg under the tongue every 5 (five) minutes as needed for chest pain.   pantoprazole 20 MG tablet Commonly known as:  PROTONIX Take 20 mg by mouth daily.   ramipril 10 MG capsule Commonly known as:  ALTACE Take 10 mg by mouth 2 (two) times daily.   sodium bicarbonate 650 MG tablet Take 650 mg by mouth 2 (two) times daily.   Vitamin D 2000 units Caps Take 5,000 Units by mouth daily.       Discharge Instructions: Please refer to Patient Instructions section of EMR for full details.  Patient was counseled important signs and symptoms that  should prompt return to medical care, changes in medications, dietary instructions, activity restrictions, and follow up appointments.   Follow-Up Appointments: Patient has an appointment with Dr.Briscoe at [facility] on 09/19/2016  Marjie Skiff, MD 09/19/2016, 12:24 AM PGY-1, [facility]

## 2016-09-20 ENCOUNTER — Encounter (HOSPITAL_COMMUNITY): Payer: Self-pay | Admitting: Emergency Medicine

## 2016-09-20 ENCOUNTER — Emergency Department (HOSPITAL_COMMUNITY)
Admission: EM | Admit: 2016-09-20 | Discharge: 2016-09-20 | Disposition: A | Discharge status: Home / Self Care | Payer: PPO | Attending: Emergency Medicine | Admitting: Emergency Medicine

## 2016-09-20 ENCOUNTER — Emergency Department (HOSPITAL_COMMUNITY): Payer: PPO

## 2016-09-20 DIAGNOSIS — R42 Dizziness and giddiness: Secondary | ICD-10-CM | POA: Diagnosis not present

## 2016-09-20 DIAGNOSIS — R51 Headache: Secondary | ICD-10-CM | POA: Diagnosis not present

## 2016-09-20 DIAGNOSIS — Z87891 Personal history of nicotine dependence: Secondary | ICD-10-CM | POA: Diagnosis not present

## 2016-09-20 DIAGNOSIS — Z7982 Long term (current) use of aspirin: Secondary | ICD-10-CM | POA: Diagnosis not present

## 2016-09-20 DIAGNOSIS — Z8673 Personal history of transient ischemic attack (TIA), and cerebral infarction without residual deficits: Secondary | ICD-10-CM | POA: Insufficient documentation

## 2016-09-20 DIAGNOSIS — R531 Weakness: Secondary | ICD-10-CM | POA: Diagnosis not present

## 2016-09-20 DIAGNOSIS — I10 Essential (primary) hypertension: Secondary | ICD-10-CM | POA: Diagnosis not present

## 2016-09-20 DIAGNOSIS — Z79899 Other long term (current) drug therapy: Secondary | ICD-10-CM | POA: Insufficient documentation

## 2016-09-20 DIAGNOSIS — R404 Transient alteration of awareness: Secondary | ICD-10-CM | POA: Diagnosis not present

## 2016-09-20 LAB — COMPREHENSIVE METABOLIC PANEL
ALK PHOS: 95 U/L (ref 38–126)
ALT: 20 U/L (ref 14–54)
AST: 30 U/L (ref 15–41)
Albumin: 3.6 g/dL (ref 3.5–5.0)
Anion gap: 10 (ref 5–15)
BILIRUBIN TOTAL: 0.7 mg/dL (ref 0.3–1.2)
BUN: 26 mg/dL — AB (ref 6–20)
CHLORIDE: 102 mmol/L (ref 101–111)
CO2: 27 mmol/L (ref 22–32)
CREATININE: 1.32 mg/dL — AB (ref 0.44–1.00)
Calcium: 9.4 mg/dL (ref 8.9–10.3)
GFR calc Af Amer: 43 mL/min — ABNORMAL LOW (ref >60)
GFR, EST NON AFRICAN AMERICAN: 37 mL/min — AB (ref >60)
Glucose, Bld: 158 mg/dL — ABNORMAL HIGH (ref 65–99)
Potassium: 4.3 mmol/L (ref 3.5–5.1)
Sodium: 139 mmol/L (ref 135–145)
TOTAL PROTEIN: 6.6 g/dL (ref 6.5–8.1)

## 2016-09-20 LAB — CBC
HEMATOCRIT: 40.6 % (ref 36.0–46.0)
HEMOGLOBIN: 13.2 g/dL (ref 12.0–15.0)
MCH: 30.5 pg (ref 26.0–34.0)
MCHC: 32.5 g/dL (ref 30.0–36.0)
MCV: 93.8 fL (ref 78.0–100.0)
Platelets: 202 10*3/uL (ref 150–400)
RBC: 4.33 MIL/uL (ref 3.87–5.11)
RDW: 13.2 % (ref 11.5–15.5)
WBC: 9.4 10*3/uL (ref 4.0–10.5)

## 2016-09-20 MED ORDER — PROCHLORPERAZINE EDISYLATE 5 MG/ML IJ SOLN
10.0000 mg | Freq: Once | INTRAMUSCULAR | Status: AC
  Administered 2016-09-20: 10 mg via INTRAVENOUS
  Filled 2016-09-20: qty 2

## 2016-09-20 MED ORDER — SODIUM CHLORIDE 0.9 % IV BOLUS (SEPSIS): 500.0000 mL | Freq: Once | INTRAVENOUS | Status: DC

## 2016-09-20 NOTE — ED Triage Notes (Signed)
Per EMS pt was here Wednesday for TIA that resolved, today pt restarted BP medication and had episode of dizziness and weakness. Both sided weakness, A+Ox4, early dementia, c/o intermittent pain behind L eye

## 2016-09-20 NOTE — ED Provider Notes (Signed)
[facility]-EMERGENCY DEPT Provider Note   CSN: [id number] Arrival date & time: 09/20/16  1415     History   Chief Complaint Chief Complaint  Patient presents with  . Weakness    HPI Monica Choi is a 83 y.o. female.  HPI Monica Choi is an 83 year old female recently admitted for TIA workup, history of type 2 diabetes, hypertension, CK D3, depression, sick sinus syndrome with pacemaker who presents today with reports that she had an episode of increased weakness today. It is reported that she restarted her blood pressure medicine and had some dizziness as well as difficulty lifting her laundry up. She denies any lower extremity weakness or lateralized deficits. She denies any vision changes. She was discharged 613 after admission at which time she had a CT angiogram of the head that showed no acute arterial finding, cervical carotid atherosclerosis and intracranial atherosclerosis EMS reports systolic blood pressure greater than 200 on their assessment. Son states that she was walking across her today when she bent over to pick something up and then she stated that over and walk to the chair and sat down. He states that she was not speaking to him for several minutes. Sitting up in chair and seemed to have her eye movements in place. He is concerned that she had a stroke. He did not note a lateralized symptoms. She walked and sat without difficulty but did not speak for several minutes. Past Medical History:  Diagnosis Date  . Anemia   . Anxiety   . Arrhythmia   . Diverticulitis   . GERD (gastroesophageal reflux disease)   . Hypertension   . Rectal prolapse   . Renal disorder   . Sinus node dysfunction [facility](HCC)     Patient Active Problem List   Diagnosis Date Noted  . Nonintractable headache   . TIA (transient ischemic attack) 09/17/2016  . Hypertension 07/17/2016  . Sick sinus syndrome (HCC) 07/17/2016    Past Surgical History:  Procedure Laterality Date  . ABDOMINAL HYSTERECTOMY     . ABDOMINAL SURGERY    . CARDIAC SURGERY    . CATARACT EXTRACTION Bilateral   . CHOLECYSTECTOMY    . COLON SURGERY    . HERNIA REPAIR    . KNEE SURGERY    . NASAL SINUS SURGERY      OB History    No data available       Home Medications    Prior to Admission medications   Medication Sig Start Date End Date Taking? Authorizing Provider  acetaminophen (ACETAMINOPHEN 8 HOUR) 650 MG CR tablet Take 1,300 mg by mouth every 8 (eight) hours as needed for pain.     [provider]  allopurinol (ZYLOPRIM) 100 MG tablet Take 1 tablet by mouth daily. 06/13/16 06/13/17  [provider]  aspirin EC 81 MG EC tablet Take 1 tablet (81 mg total) by mouth daily. 09/19/16   Tillman Sersiccio, Angela C, DO  atorvastatin (LIPITOR) 40 MG tablet Take 1 tablet (40 mg total) by mouth daily at 6 PM. 09/18/16   Riccio, Marcell AngerAngela C, DO  bismuth subsalicylate (PEPTO BISMOL) 262 MG chewable tablet Chew 2 tablets by mouth daily as needed for indigestion or diarrhea or loose stools.     [provider]  carvedilol (COREG) 12.5 MG tablet Take 1 tablet (12.5 mg total) by mouth 2 (two) times daily. 07/17/16   Camnitz, Andree CossWill Martin, MD  cetirizine (ZYRTEC) 10 MG tablet Take 10 mg by mouth daily.    [provider]  Cholecalciferol (VITAMIN D) 2000 units CAPS Take 5,000 Units by mouth daily.    [provider]  DULoxetine (CYMBALTA) 30 MG capsule Take 30 mg by mouth 2 (two) times daily.    [provider]  metoCLOPramide (REGLAN) 10 MG tablet Take 1 tablet (10 mg total) by mouth every 6 (six) hours as needed (for nausea/headache). 09/16/16   Molpus, John, MD  nitroGLYCERIN (NITROSTAT) 0.4 MG SL tablet Place 0.4 mg under the tongue every 5 (five) minutes as needed for chest pain.    [provider]  pantoprazole (PROTONIX) 20 MG tablet Take 20 mg by mouth daily. 04/08/16   [provider]  ramipril (ALTACE) 10 MG capsule Take 10 mg by mouth 2 (two) times daily.    [provider]  sodium bicarbonate 650 MG tablet Take 650 mg by mouth 2 (two) times daily.  03/27/16   [provider]    Family History Family History  Problem Relation Age of Onset  . Heart attack Father   . Heart attack Brother     Social History Social History  Substance Use Topics  . Smoking status: Former Games developer  . Smokeless tobacco: Never Used  . Alcohol use Not on file     Allergies   Morphine; Sulfa antibiotics; Amoxicillin; Benzonatate; Buprenorphine; Ciprofloxacin; and Morphine and related   Review of Systems Review of Systems  Constitutional: Positive for activity change.  Musculoskeletal: Positive for arthralgias.  All other systems reviewed and are negative.    Physical Exam Updated Vital Signs SpO2 99%   Physical Exam  Constitutional: She is oriented to person, place, and time. She appears well-developed and well-nourished. No distress.  HENT:  Head: Normocephalic and atraumatic.  Right Ear: External ear normal.  Left Ear: External ear normal.  Nose: Nose normal.  Eyes: Conjunctivae and EOM are normal. Pupils are equal, round, and reactive to light.  Neck: Normal range of motion. Neck supple.  Cardiovascular: Normal rate.   Pulmonary/Chest: Effort normal.  Abdominal: Soft.  Musculoskeletal: Normal range of motion.  Neurological: She is alert and oriented to person, place, and time. She exhibits normal muscle tone. Coordination normal.  Skin: Skin is warm and dry.  Psychiatric: She has a normal mood and affect. Her behavior is normal. Thought content normal.  Nursing note and vitals reviewed.    ED Treatments / Results  Labs (all labs ordered are listed, but only abnormal results are displayed) Labs Reviewed - No data to display  EKG  EKG Interpretation  Date/Time:  Saturday September 20 2016 14:47:14 EDT Ventricular Rate:  68 PR Interval:    QRS Duration: 87 QT Interval:  443 QTC Calculation: 478 R Axis:   -42 Text  Interpretation:  Atrial-paced complexes LVH with secondary repolarization abnormality Probable anterior infarct, age indeterminate No significant change since last tracing Confirmed by Margarita Grizzle [phone]) on 09/20/2016 3:36:02 PM       Radiology Ct Head Wo Contrast  Result Date: 09/20/2016 CLINICAL DATA:  Headaches and TIA like symptoms. Left upper extremity weakness. EXAM: CT HEAD WITHOUT CONTRAST TECHNIQUE: Contiguous axial images were obtained from the base of the skull through the vertex without intravenous contrast. COMPARISON:  Two days ago. FINDINGS: Brain: No evidence of acute infarction, hemorrhage, hydrocephalus, extra-axial collection or mass lesion/mass effect. Mild for age mainly periventricular white matter low-density attributed to chronic small vessel ischemia. Normal brain volume for age. Vascular: Atherosclerotic calcification.  No hyperdense vessel. Skull: No acute or aggressive finding.  Incidental atlanto occipital non segmentation. Sinuses/Orbits: Medial maxillary antrostomies. Maxillary sinuses are small with chronic mucosal thickening. Bilateral cataract resection. IMPRESSION: Senescent changes without acute finding or change from prior. Electronically Signed   By: Marnee Spring M.D.   On: 09/20/2016 15:48    Procedures Procedures (including critical care time)  Medications Ordered in ED Medications - No data to display   Initial Impression / Assessment and Plan / ED Course  I have reviewed the triage vital signs and the nursing notes.  Pertinent labs & imaging results that were available during my care of the patient were reviewed by me and considered in my medical decision making (see chart for details).     Patient has remained hemodynamically stable here with a normal neurological exam. She is now complaining of headache. She has been having headaches. She is given IV Compazine. I discussed results for test patient and her son. She is stable for discharge may be  discharged after her IV Compazine. She does have some elevated creatinine is receiving IV fluid bolus. 1- episode of hunched over walking and decreased responsiveness- family is quite concerned that Monica episode represented a stroke. However, I cannot elicit lateralized symptoms and she was able to walk without difficulty during Monica period she did have an episode where she did not respond to them but was tracking with her eyes. She has had a recent TIA workup and is not a candidate for MRI. By strict return if there are return of symptoms. Otherwise to follow-up with her primary care Dr. 2 hypertension patient has had her antihypertensive stopped it has restarted that today. She is somewhat hypertensive here with systolic blood pressure 170. However just restarted her medications today we will continue these and have Monica followed up with recheck. 3 elevated creatinine  Final Clinical Impressions(s) / ED Diagnoses   Final diagnoses:  Weakness    New Prescriptions New Prescriptions   No medications on file     Margarita Grizzle, MD 09/21/16 2107

## 2016-09-20 NOTE — Discharge Instructions (Signed)
Please continue her bp medicines as prescribed. Recheck of blood pressure and creatinine with her doctor on Monday.  Return if worse at any time- especially increased weakness.

## 2016-09-20 NOTE — ED Notes (Signed)
Pt did not want IV placed at this time, phlebotomy contacted due to small veins

## 2016-09-20 NOTE — ED Notes (Signed)
Patient transported to CT 

## 2016-09-23 DIAGNOSIS — N183 Chronic kidney disease, stage 3 (moderate): Secondary | ICD-10-CM | POA: Diagnosis not present

## 2016-09-23 DIAGNOSIS — E1122 Type 2 diabetes mellitus with diabetic chronic kidney disease: Secondary | ICD-10-CM | POA: Diagnosis not present

## 2016-09-23 DIAGNOSIS — Z7982 Long term (current) use of aspirin: Secondary | ICD-10-CM | POA: Diagnosis not present

## 2016-09-23 DIAGNOSIS — E785 Hyperlipidemia, unspecified: Secondary | ICD-10-CM | POA: Diagnosis not present

## 2016-09-23 DIAGNOSIS — D631 Anemia in chronic kidney disease: Secondary | ICD-10-CM | POA: Diagnosis not present

## 2016-09-23 DIAGNOSIS — F329 Major depressive disorder, single episode, unspecified: Secondary | ICD-10-CM | POA: Diagnosis not present

## 2016-09-23 DIAGNOSIS — K5730 Diverticulosis of large intestine without perforation or abscess without bleeding: Secondary | ICD-10-CM | POA: Diagnosis not present

## 2016-09-23 DIAGNOSIS — M109 Gout, unspecified: Secondary | ICD-10-CM | POA: Diagnosis not present

## 2016-09-23 DIAGNOSIS — Z950 Presence of cardiac pacemaker: Secondary | ICD-10-CM | POA: Diagnosis not present

## 2016-09-23 DIAGNOSIS — I129 Hypertensive chronic kidney disease with stage 1 through stage 4 chronic kidney disease, or unspecified chronic kidney disease: Secondary | ICD-10-CM | POA: Diagnosis not present

## 2016-09-23 DIAGNOSIS — I495 Sick sinus syndrome: Secondary | ICD-10-CM | POA: Diagnosis not present

## 2016-09-23 DIAGNOSIS — Z87891 Personal history of nicotine dependence: Secondary | ICD-10-CM | POA: Diagnosis not present

## 2016-10-01 DIAGNOSIS — E785 Hyperlipidemia, unspecified: Secondary | ICD-10-CM | POA: Diagnosis not present

## 2016-10-01 DIAGNOSIS — N183 Chronic kidney disease, stage 3 (moderate): Secondary | ICD-10-CM | POA: Diagnosis not present

## 2016-10-01 DIAGNOSIS — M109 Gout, unspecified: Secondary | ICD-10-CM | POA: Diagnosis not present

## 2016-10-01 DIAGNOSIS — K5730 Diverticulosis of large intestine without perforation or abscess without bleeding: Secondary | ICD-10-CM | POA: Diagnosis not present

## 2016-10-01 DIAGNOSIS — F329 Major depressive disorder, single episode, unspecified: Secondary | ICD-10-CM | POA: Diagnosis not present

## 2016-10-01 DIAGNOSIS — Z87891 Personal history of nicotine dependence: Secondary | ICD-10-CM | POA: Diagnosis not present

## 2016-10-01 DIAGNOSIS — Z950 Presence of cardiac pacemaker: Secondary | ICD-10-CM | POA: Diagnosis not present

## 2016-10-01 DIAGNOSIS — Z7982 Long term (current) use of aspirin: Secondary | ICD-10-CM | POA: Diagnosis not present

## 2016-10-01 DIAGNOSIS — I129 Hypertensive chronic kidney disease with stage 1 through stage 4 chronic kidney disease, or unspecified chronic kidney disease: Secondary | ICD-10-CM | POA: Diagnosis not present

## 2016-10-01 DIAGNOSIS — D631 Anemia in chronic kidney disease: Secondary | ICD-10-CM | POA: Diagnosis not present

## 2016-10-01 DIAGNOSIS — I495 Sick sinus syndrome: Secondary | ICD-10-CM | POA: Diagnosis not present

## 2016-10-01 DIAGNOSIS — E1122 Type 2 diabetes mellitus with diabetic chronic kidney disease: Secondary | ICD-10-CM | POA: Diagnosis not present

## 2016-10-02 DIAGNOSIS — Z87891 Personal history of nicotine dependence: Secondary | ICD-10-CM | POA: Diagnosis not present

## 2016-10-02 DIAGNOSIS — E785 Hyperlipidemia, unspecified: Secondary | ICD-10-CM | POA: Diagnosis not present

## 2016-10-02 DIAGNOSIS — Z7982 Long term (current) use of aspirin: Secondary | ICD-10-CM | POA: Diagnosis not present

## 2016-10-02 DIAGNOSIS — D631 Anemia in chronic kidney disease: Secondary | ICD-10-CM | POA: Diagnosis not present

## 2016-10-02 DIAGNOSIS — Z950 Presence of cardiac pacemaker: Secondary | ICD-10-CM | POA: Diagnosis not present

## 2016-10-02 DIAGNOSIS — I129 Hypertensive chronic kidney disease with stage 1 through stage 4 chronic kidney disease, or unspecified chronic kidney disease: Secondary | ICD-10-CM | POA: Diagnosis not present

## 2016-10-02 DIAGNOSIS — K5730 Diverticulosis of large intestine without perforation or abscess without bleeding: Secondary | ICD-10-CM | POA: Diagnosis not present

## 2016-10-02 DIAGNOSIS — N183 Chronic kidney disease, stage 3 (moderate): Secondary | ICD-10-CM | POA: Diagnosis not present

## 2016-10-02 DIAGNOSIS — E1122 Type 2 diabetes mellitus with diabetic chronic kidney disease: Secondary | ICD-10-CM | POA: Diagnosis not present

## 2016-10-02 DIAGNOSIS — I495 Sick sinus syndrome: Secondary | ICD-10-CM | POA: Diagnosis not present

## 2016-10-02 DIAGNOSIS — M109 Gout, unspecified: Secondary | ICD-10-CM | POA: Diagnosis not present

## 2016-10-02 DIAGNOSIS — F329 Major depressive disorder, single episode, unspecified: Secondary | ICD-10-CM | POA: Diagnosis not present

## 2016-10-03 DIAGNOSIS — G459 Transient cerebral ischemic attack, unspecified: Secondary | ICD-10-CM | POA: Diagnosis not present

## 2016-10-05 DIAGNOSIS — D631 Anemia in chronic kidney disease: Secondary | ICD-10-CM | POA: Diagnosis not present

## 2016-10-05 DIAGNOSIS — K5730 Diverticulosis of large intestine without perforation or abscess without bleeding: Secondary | ICD-10-CM | POA: Diagnosis not present

## 2016-10-05 DIAGNOSIS — F329 Major depressive disorder, single episode, unspecified: Secondary | ICD-10-CM | POA: Diagnosis not present

## 2016-10-05 DIAGNOSIS — Z7982 Long term (current) use of aspirin: Secondary | ICD-10-CM | POA: Diagnosis not present

## 2016-10-05 DIAGNOSIS — M109 Gout, unspecified: Secondary | ICD-10-CM | POA: Diagnosis not present

## 2016-10-05 DIAGNOSIS — I495 Sick sinus syndrome: Secondary | ICD-10-CM | POA: Diagnosis not present

## 2016-10-05 DIAGNOSIS — Z87891 Personal history of nicotine dependence: Secondary | ICD-10-CM | POA: Diagnosis not present

## 2016-10-05 DIAGNOSIS — N183 Chronic kidney disease, stage 3 (moderate): Secondary | ICD-10-CM | POA: Diagnosis not present

## 2016-10-05 DIAGNOSIS — E1122 Type 2 diabetes mellitus with diabetic chronic kidney disease: Secondary | ICD-10-CM | POA: Diagnosis not present

## 2016-10-05 DIAGNOSIS — E785 Hyperlipidemia, unspecified: Secondary | ICD-10-CM | POA: Diagnosis not present

## 2016-10-05 DIAGNOSIS — I129 Hypertensive chronic kidney disease with stage 1 through stage 4 chronic kidney disease, or unspecified chronic kidney disease: Secondary | ICD-10-CM | POA: Diagnosis not present

## 2016-10-05 DIAGNOSIS — Z950 Presence of cardiac pacemaker: Secondary | ICD-10-CM | POA: Diagnosis not present

## 2016-10-06 DIAGNOSIS — K5730 Diverticulosis of large intestine without perforation or abscess without bleeding: Secondary | ICD-10-CM | POA: Diagnosis not present

## 2016-10-06 DIAGNOSIS — I495 Sick sinus syndrome: Secondary | ICD-10-CM | POA: Diagnosis not present

## 2016-10-06 DIAGNOSIS — Z87891 Personal history of nicotine dependence: Secondary | ICD-10-CM | POA: Diagnosis not present

## 2016-10-06 DIAGNOSIS — F329 Major depressive disorder, single episode, unspecified: Secondary | ICD-10-CM | POA: Diagnosis not present

## 2016-10-06 DIAGNOSIS — N183 Chronic kidney disease, stage 3 (moderate): Secondary | ICD-10-CM | POA: Diagnosis not present

## 2016-10-06 DIAGNOSIS — L281 Prurigo nodularis: Secondary | ICD-10-CM | POA: Diagnosis not present

## 2016-10-06 DIAGNOSIS — M109 Gout, unspecified: Secondary | ICD-10-CM | POA: Diagnosis not present

## 2016-10-06 DIAGNOSIS — L299 Pruritus, unspecified: Secondary | ICD-10-CM | POA: Diagnosis not present

## 2016-10-06 DIAGNOSIS — Z950 Presence of cardiac pacemaker: Secondary | ICD-10-CM | POA: Diagnosis not present

## 2016-10-06 DIAGNOSIS — E785 Hyperlipidemia, unspecified: Secondary | ICD-10-CM | POA: Diagnosis not present

## 2016-10-06 DIAGNOSIS — D692 Other nonthrombocytopenic purpura: Secondary | ICD-10-CM | POA: Diagnosis not present

## 2016-10-06 DIAGNOSIS — Z7982 Long term (current) use of aspirin: Secondary | ICD-10-CM | POA: Diagnosis not present

## 2016-10-06 DIAGNOSIS — D631 Anemia in chronic kidney disease: Secondary | ICD-10-CM | POA: Diagnosis not present

## 2016-10-06 DIAGNOSIS — E1122 Type 2 diabetes mellitus with diabetic chronic kidney disease: Secondary | ICD-10-CM | POA: Diagnosis not present

## 2016-10-06 DIAGNOSIS — I129 Hypertensive chronic kidney disease with stage 1 through stage 4 chronic kidney disease, or unspecified chronic kidney disease: Secondary | ICD-10-CM | POA: Diagnosis not present

## 2016-10-08 ENCOUNTER — Emergency Department (HOSPITAL_COMMUNITY): Payer: PPO

## 2016-10-08 ENCOUNTER — Encounter (HOSPITAL_COMMUNITY): Payer: Self-pay | Admitting: Emergency Medicine

## 2016-10-08 ENCOUNTER — Emergency Department (HOSPITAL_COMMUNITY)
Admission: EM | Admit: 2016-10-08 | Discharge: 2016-10-08 | Disposition: A | Discharge status: Home / Self Care | Payer: PPO | Attending: Emergency Medicine | Admitting: Emergency Medicine

## 2016-10-08 DIAGNOSIS — R404 Transient alteration of awareness: Secondary | ICD-10-CM | POA: Diagnosis not present

## 2016-10-08 DIAGNOSIS — Z8673 Personal history of transient ischemic attack (TIA), and cerebral infarction without residual deficits: Secondary | ICD-10-CM | POA: Insufficient documentation

## 2016-10-08 DIAGNOSIS — Z7982 Long term (current) use of aspirin: Secondary | ICD-10-CM | POA: Diagnosis not present

## 2016-10-08 DIAGNOSIS — R4182 Altered mental status, unspecified: Secondary | ICD-10-CM | POA: Diagnosis not present

## 2016-10-08 DIAGNOSIS — Z79899 Other long term (current) drug therapy: Secondary | ICD-10-CM | POA: Insufficient documentation

## 2016-10-08 DIAGNOSIS — I10 Essential (primary) hypertension: Secondary | ICD-10-CM | POA: Diagnosis not present

## 2016-10-08 DIAGNOSIS — F341 Dysthymic disorder: Secondary | ICD-10-CM | POA: Insufficient documentation

## 2016-10-08 DIAGNOSIS — Z87891 Personal history of nicotine dependence: Secondary | ICD-10-CM | POA: Diagnosis not present

## 2016-10-08 DIAGNOSIS — I517 Cardiomegaly: Secondary | ICD-10-CM | POA: Diagnosis not present

## 2016-10-08 DIAGNOSIS — R079 Chest pain, unspecified: Secondary | ICD-10-CM | POA: Diagnosis present

## 2016-10-08 DIAGNOSIS — R29810 Facial weakness: Secondary | ICD-10-CM | POA: Diagnosis not present

## 2016-10-08 DIAGNOSIS — R030 Elevated blood-pressure reading, without diagnosis of hypertension: Secondary | ICD-10-CM | POA: Diagnosis not present

## 2016-10-08 HISTORY — DX: Transient cerebral ischemic attack, unspecified: G45.9

## 2016-10-08 LAB — URINALYSIS, ROUTINE W REFLEX MICROSCOPIC
BACTERIA UA: NONE SEEN
BILIRUBIN URINE: NEGATIVE
Glucose, UA: NEGATIVE mg/dL
HGB URINE DIPSTICK: NEGATIVE
KETONES UR: NEGATIVE mg/dL
LEUKOCYTES UA: NEGATIVE
NITRITE: NEGATIVE
Protein, ur: 100 mg/dL — AB
Specific Gravity, Urine: 1.011 (ref 1.005–1.030)
pH: 8 (ref 5.0–8.0)

## 2016-10-08 LAB — CBC WITH DIFFERENTIAL/PLATELET
Basophils Absolute: 0 10*3/uL (ref 0.0–0.1)
Basophils Relative: 1 %
EOS PCT: 2 %
Eosinophils Absolute: 0.2 10*3/uL (ref 0.0–0.7)
HEMATOCRIT: 37.4 % (ref 36.0–46.0)
Hemoglobin: 12.3 g/dL (ref 12.0–15.0)
LYMPHS ABS: 2.3 10*3/uL (ref 0.7–4.0)
Lymphocytes Relative: 26 %
MCH: 30.4 pg (ref 26.0–34.0)
MCHC: 32.9 g/dL (ref 30.0–36.0)
MCV: 92.6 fL (ref 78.0–100.0)
MONO ABS: 0.8 10*3/uL (ref 0.1–1.0)
MONOS PCT: 10 %
Neutro Abs: 5.5 10*3/uL (ref 1.7–7.7)
Neutrophils Relative %: 61 %
PLATELETS: 296 10*3/uL (ref 150–400)
RBC: 4.04 MIL/uL (ref 3.87–5.11)
RDW: 12.5 % (ref 11.5–15.5)
WBC: 8.8 10*3/uL (ref 4.0–10.5)

## 2016-10-08 LAB — COMPREHENSIVE METABOLIC PANEL
ALBUMIN: 2.8 g/dL — AB (ref 3.5–5.0)
ALT: 14 U/L (ref 14–54)
AST: 28 U/L (ref 15–41)
Alkaline Phosphatase: 84 U/L (ref 38–126)
Anion gap: 10 (ref 5–15)
BILIRUBIN TOTAL: 1.3 mg/dL — AB (ref 0.3–1.2)
BUN: 10 mg/dL (ref 6–20)
CHLORIDE: 101 mmol/L (ref 101–111)
CO2: 25 mmol/L (ref 22–32)
Calcium: 9.2 mg/dL (ref 8.9–10.3)
Creatinine, Ser: 0.98 mg/dL (ref 0.44–1.00)
GFR calc Af Amer: >60 mL/min (ref >60)
GFR calc non Af Amer: 53 mL/min — ABNORMAL LOW (ref >60)
GLUCOSE: 181 mg/dL — AB (ref 65–99)
POTASSIUM: 4.2 mmol/L (ref 3.5–5.1)
SODIUM: 136 mmol/L (ref 135–145)
TOTAL PROTEIN: 6.3 g/dL — AB (ref 6.5–8.1)

## 2016-10-08 LAB — CBG MONITORING, ED: Glucose-Capillary: 69 mg/dL (ref 65–99)

## 2016-10-08 LAB — I-STAT CHEM 8, ED
BUN: 14 mg/dL (ref 6–20)
Calcium, Ion: 1.14 mmol/L — ABNORMAL LOW (ref 1.15–1.40)
Chloride: 101 mmol/L (ref 101–111)
Creatinine, Ser: 0.8 mg/dL (ref 0.44–1.00)
Glucose, Bld: 181 mg/dL — ABNORMAL HIGH (ref 65–99)
HEMATOCRIT: 36 % (ref 36.0–46.0)
Hemoglobin: 12.2 g/dL (ref 12.0–15.0)
Potassium: 4.6 mmol/L (ref 3.5–5.1)
SODIUM: 138 mmol/L (ref 135–145)
TCO2: 36 mmol/L (ref 0–100)

## 2016-10-08 LAB — AMMONIA: Ammonia: 49 umol/L — ABNORMAL HIGH (ref 9–35)

## 2016-10-08 LAB — I-STAT TROPONIN, ED: Troponin i, poc: 0.02 ng/mL (ref 0.00–0.08)

## 2016-10-08 LAB — ETHANOL: Alcohol, Ethyl (B): <5 mg/dL (ref <5)

## 2016-10-08 LAB — I-STAT CG4 LACTIC ACID, ED: Lactic Acid, Venous: 1.27 mmol/L (ref 0.5–1.9)

## 2016-10-08 MED ORDER — CARVEDILOL 12.5 MG PO TABS
12.5000 mg | ORAL_TABLET | Freq: Two times a day (BID) | ORAL | Status: DC
  Administered 2016-10-08: 12.5 mg via ORAL
  Filled 2016-10-08: qty 1

## 2016-10-08 MED ORDER — HYDRALAZINE HCL 25 MG PO TABS
25.0000 mg | ORAL_TABLET | Freq: Once | ORAL | Status: AC
  Administered 2016-10-08: 25 mg via ORAL
  Filled 2016-10-08: qty 1

## 2016-10-08 MED ORDER — ACETAMINOPHEN 500 MG PO TABS
1000.0000 mg | ORAL_TABLET | Freq: Once | ORAL | Status: AC
  Administered 2016-10-08: 1000 mg via ORAL
  Filled 2016-10-08: qty 2

## 2016-10-08 MED ORDER — CARVEDILOL 12.5 MG PO TABS: 12.5000 mg | ORAL_TABLET | Freq: Two times a day (BID) | ORAL | Status: DC

## 2016-10-08 MED ORDER — RAMIPRIL 10 MG PO CAPS
10.0000 mg | ORAL_CAPSULE | Freq: Every day | ORAL | Status: DC
  Administered 2016-10-08: 10 mg via ORAL
  Filled 2016-10-08: qty 1

## 2016-10-08 NOTE — ED Notes (Signed)
Pt's son in law at bedside-- pt stopped talking, did not answer any questions from pharm tech-- son in law answered all questions. Son in law was speaking to dr Adela Lankfloyd in hallway.

## 2016-10-08 NOTE — ED Triage Notes (Signed)
Pt to ED via [facility] from home, pt's son in law called 911-- stated that pt called her daughter multiple times and hung up. When son in law checked on pt, was unable to wake pt up. Told paramedics that "she may be playing possum, we want her checked out for psych-- " on arrival pt c/o chest pain, abd pain--  Pt crying intermittently when talking. States she has moved here from [place] 6 months ago-- to be by daughter and son in law- she states that he goes to all of her doctor's appts with her-- "he wants money-- he tells the doctors what he wants to and follows me around from doctor to doctor"

## 2016-10-08 NOTE — ED Notes (Signed)
Pts son in law has left "I have to run an errand. Ill be back asap." Pt sitting up in bed eating dinner.

## 2016-10-08 NOTE — ED Provider Notes (Addendum)
[facility]-EM[facility]Y DEPT Provider Note   CSN: [id number] Arrival date & time: 10/08/16  1338     History   Chief Complaint Chief Complaint  Patient presents with  . Chest Pain  . Altered Mental Status    HPI Monica Choi is a 83 y.o. female.  83 yo F with a chief complaint of altered mental status.  Per EMS the patient has been having some fluctuations the family believes are psychiatric in nature. Per the patient she has been feeling perfectly fine. She is unsure why she is here exactly. Denies cough congestion fevers abdominal pain vomiting. Patient states that she has felt generally unwell from the diaphragm up for many years. She is unable to describe the symptoms. Denies what makes it better or worse. Denies lower extremity edema. Denies head injury.   The history is provided by the patient.  Chest Pain   Pertinent negatives include no dizziness, no fever, no headaches, no nausea, no palpitations, no shortness of breath and no vomiting.  Altered Mental Status    Illness  This is a new problem. The current episode started less than 1 hour ago. The problem occurs constantly. The problem has not changed since onset.Pertinent negatives include no chest pain, no headaches and no shortness of breath. Nothing aggravates the symptoms. Nothing relieves the symptoms. She has tried nothing for the symptoms. The treatment provided no relief.    Past Medical History:  Diagnosis Date  . Anemia   . Anxiety   . Arrhythmia   . Diverticulitis   . GERD (gastroesophageal reflux disease)   . Hypertension   . Rectal prolapse   . Renal disorder   . Sinus node dysfunction (HCC)   . TIA (transient ischemic attack)     Patient Active Problem List   Diagnosis Date Noted  . Nonintractable headache   . TIA (transient ischemic attack) 09/17/2016  . Hypertension 07/17/2016  . Sick sinus syndrome (HCC) 07/17/2016    Past Surgical History:  Procedure Laterality Date  . ABDOMINAL HYSTERECTOMY      . ABDOMINAL SURGERY    . CARDIAC SURGERY    . CATARACT EXTRACTION Bilateral   . CHOLECYSTECTOMY    . COLON SURGERY    . HERNIA REPAIR    . KNEE SURGERY    . NASAL SINUS SURGERY      OB History    No data available       Home Medications    Prior to Admission medications   Medication Sig Start Date End Date Taking? Authorizing Provider  acetaminophen (ACETAMINOPHEN 8 HOUR) 650 MG CR tablet Take 1,300 mg by mouth every 8 (eight) hours as needed for pain.    Yes [provider]  allopurinol (ZYLOPRIM) 100 MG tablet Take 100 mg by mouth daily as needed (gout).  06/13/16 06/13/17 Yes [provider]  aspirin EC 81 MG EC tablet Take 1 tablet (81 mg total) by mouth daily. 09/19/16  Yes Riccio, Marylene Land C, DO  atorvastatin (LIPITOR) 40 MG tablet Take 1 tablet (40 mg total) by mouth daily at 6 PM. 09/18/16  Yes Riccio, Angela C, DO  carvedilol (COREG) 12.5 MG tablet Take 1 tablet (12.5 mg total) by mouth 2 (two) times daily. 07/17/16  Yes Camnitz, Andree Coss, MD  Cholecalciferol (VITAMIN D) 2000 units CAPS Take 2,000 Units by mouth daily.    Yes [provider]  DULoxetine (CYMBALTA) 30 MG capsule Take 30 mg by mouth 2 (two) times daily.   Yes [provider]  loratadine (CLARITIN) 10 MG tablet Take 10 mg by mouth daily as needed for allergies.   Yes [provider]  metoCLOPramide (REGLAN) 10 MG tablet Take 1 tablet (10 mg total) by mouth every 6 (six) hours as needed (for nausea/headache). 09/16/16  Yes Molpus, John, MD  nitroGLYCERIN (NITROSTAT) 0.4 MG SL tablet Place 0.4 mg under the tongue every 5 (five) minutes as needed for chest pain.   Yes [provider]  pantoprazole (PROTONIX) 20 MG tablet Take 20 mg by mouth daily. 04/08/16  Yes [provider]  ramipril (ALTACE) 10 MG capsule Take 10 mg by mouth 2 (two) times daily.   Yes [provider]  sodium bicarbonate 650 MG tablet Take 650 mg by mouth 2 (two) times daily.   03/27/16  Yes [provider]    Family History Family History  Problem Relation Age of Onset  . Heart attack Father   . Heart attack Brother     Social History Social History  Substance Use Topics  . Smoking status: Former Games developermoker  . Smokeless tobacco: Never Used  . Alcohol use No     Allergies   Morphine; Sulfa antibiotics; Amoxicillin; Benzonatate; Buprenorphine; Fish-derived products; Penicillins; Ciprofloxacin; and Morphine and related   Review of Systems Review of Systems  Constitutional: Negative for chills and fever.  HENT: Negative for congestion and rhinorrhea.   Eyes: Negative for redness and visual disturbance.  Respiratory: Negative for shortness of breath and wheezing.   Cardiovascular: Negative for chest pain and palpitations.  Gastrointestinal: Negative for nausea and vomiting.  Genitourinary: Negative for dysuria and urgency.  Musculoskeletal: Negative for arthralgias and myalgias.  Skin: Negative for pallor and wound.  Neurological: Negative for dizziness and headaches.  Psychiatric/Behavioral: Positive for dysphoric mood.     Physical Exam Updated Vital Signs BP (!) 221/76   Pulse 79   Temp 98.1 F (36.7 C)   Resp 16   SpO2 96%   Physical Exam  Constitutional: She is oriented to person, place, and time. She appears well-developed and well-nourished. No distress.  HENT:  Head: Normocephalic and atraumatic.  Eyes: EOM are normal. Pupils are equal, round, and reactive to light.  Neck: Normal range of motion. Neck supple.  Cardiovascular: Normal rate and regular rhythm.  Exam reveals no gallop and no friction rub.   No murmur heard. Pulmonary/Chest: Effort normal. She has no wheezes. She has no rales.  Abdominal: Soft. She exhibits no distension. There is no tenderness.  Musculoskeletal: She exhibits no edema or tenderness.  Neurological: She is alert and oriented to person, place, and time. She has normal strength. No cranial nerve  deficit or sensory deficit. She displays a negative Romberg sign. Coordination and gait normal. GCS eye subscore is 4. GCS verbal subscore is 5. GCS motor subscore is 6. She displays no Babinski's sign on the right side. She displays no Babinski's sign on the left side.  Reflex Scores:      Tricep reflexes are 2+ on the right side and 2+ on the left side.      Bicep reflexes are 2+ on the right side and 2+ on the left side.      Brachioradialis reflexes are 2+ on the right side and 2+ on the left side.      Patellar reflexes are 2+ on the right side and 2+ on the left side.      Achilles reflexes are 2+ on the right side and 2+ on the  left side. Skin: Skin is warm and dry. She is not diaphoretic.  Psychiatric: Her behavior is normal. She exhibits a depressed mood.  Nursing note and vitals reviewed.    ED Treatments / Results  Labs (all labs ordered are listed, but only abnormal results are displayed) Labs Reviewed  AMMONIA - Abnormal; Notable for the following:       Result Value   Ammonia 49 (*)    All other components within normal limits  COMPREHENSIVE METABOLIC PANEL - Abnormal; Notable for the following:    Glucose, Bld 181 (*)    Total Protein 6.3 (*)    Albumin 2.8 (*)    Total Bilirubin 1.3 (*)    GFR calc non Af Amer 53 (*)    All other components within normal limits  URINALYSIS, ROUTINE W REFLEX MICROSCOPIC - Abnormal; Notable for the following:    Protein, ur 100 (*)    Squamous Epithelial / LPF 0-5 (*)    All other components within normal limits  I-STAT CHEM 8, ED - Abnormal; Notable for the following:    Glucose, Bld 181 (*)    Calcium, Ion 1.14 (*)    All other components within normal limits  URINE CULTURE  CBC WITH DIFFERENTIAL/PLATELET  ETHANOL  I-STAT CG4 LACTIC ACID, ED  CBG MONITORING, ED  I-STAT TROPOININ, ED    EKG  EKG Interpretation  Date/Time:  Wednesday October 08 2016 13:52:14 EDT Ventricular Rate:  64 PR Interval:    QRS Duration: 83 QT  Interval:  479 QTC Calculation: 495 R Axis:   -23 Text Interpretation:  Sinus or ectopic atrial rhythm Atrial premature complex LVH with secondary repolarization abnormality Anterior Q waves, possibly due to LVH No significant change since last tracing Confirmed by Melene Plan [phone]) on 10/08/2016 2:17:40 PM       Radiology Ct Head Wo Contrast  Result Date: 10/08/2016 CLINICAL DATA:  Poor historian with altered mental status and slurred speech as well as left facial droop. EXAM: CT HEAD WITHOUT CONTRAST TECHNIQUE: Contiguous axial images were obtained from the base of the skull through the vertex without intravenous contrast. COMPARISON:  09/20/2016 and 08/30/2016 FINDINGS: Brain: Ventricles, cisterns and other CSF spaces are within normal. There is mild chronic ischemic microvascular disease. There is no mass, mass effect, shift of midline structures or acute hemorrhage. No evidence of acute infarction. Vascular: No hyperdense vessel or unexpected calcification. Skull: Normal. Negative for fracture or focal lesion. Sinuses/Orbits: Mild mucosal membrane thickening involving the maxillary sinuses. Evidence of previous fenestration of the medial wall of the maxillary sinuses. Mastoid air cells are clear. Orbits are normal. Other: None. IMPRESSION: No acute intracranial findings. Chronic ischemic microvascular disease. Evidence of previous sinus surgery with minimal chronic sinus inflammatory change. Electronically Signed   By: Elberta Fortis M.D.   On: 10/08/2016 14:24   Dg Chest Port 1 View  Result Date: 10/08/2016 CLINICAL DATA:  Headache. EXAM: PORTABLE CHEST 1 VIEW COMPARISON:  None. FINDINGS: Left pacer in place with leads in the right atrium and right ventricle. Heart is borderline in size. No confluent airspace opacities, effusions or edema. IMPRESSION: Borderline heart size.  No active disease. Electronically Signed   By: Charlett Nose M.D.   On: 10/08/2016 14:44    Procedures Procedures  (including critical care time)  Medications Ordered in ED Medications  ramipril (ALTACE) capsule 10 mg (10 mg Oral Given 10/08/16 1515)  carvedilol (COREG) tablet 12.5 mg (not administered)  carvedilol (COREG) tablet 12.5 mg (  not administered)     Initial Impression / Assessment and Plan / ED Course  I have reviewed the triage vital signs and the nursing notes.  Pertinent labs & imaging results that were available during my care of the patient were reviewed by me and considered in my medical decision making (see chart for details).     83 yo F with a chief complaints of altered mental status. Obtain more history from family. Per the son-in-law the patient was essentially catatonic and has been having episodes of this for the past 4 years or so. They deny any recent illness. Resume when they brought her in this morning is due to her being unresponsive on the couch.  They are adamant that she obtain a stat psychiatric consult. I discussed the limitations of that evaluation in the ED. They're frustrated because it's been very hard to get her out of the house and to her appointments.  Altered mental status workup was performed here and is unremarkable. She is significantly hypertensive but the patient is been noncompliant with her medications. We'll give her dose of her lisinopril. As she's not having any active symptoms and nothing found on workup I do not feel that an acute lowering of her blood pressure is necessary. I feel she is medically cleared for TTS evaluation.  The patients results and plan were reviewed and discussed.   Any x-rays performed were independently reviewed by myself.   Differential diagnosis were considered with the presenting HPI.  Medications  ramipril (ALTACE) capsule 10 mg (10 mg Oral Given 10/08/16 1515)  carvedilol (COREG) tablet 12.5 mg (not administered)  carvedilol (COREG) tablet 12.5 mg (not administered)    Vitals:   10/08/16 1355 10/08/16 1515 10/08/16  1519 10/08/16 1530  BP: (!) 223/87 (!) 205/128  (!) 221/76  Pulse: 74   79  Resp: 16   16  Temp: 98.1 F (36.7 C)  98.1 F (36.7 C)   TempSrc: Oral     SpO2: 96%   96%    Final diagnoses:  Essential hypertension  Dysthymia       Final Clinical Impressions(s) / ED Diagnoses   Final diagnoses:  Essential hypertension  Dysthymia    New Prescriptions New Prescriptions   No medications on file     Melene Plan, DO 10/08/16 1551    Melene Plan, DO 10/08/16 1553

## 2016-10-08 NOTE — ED Notes (Signed)
Pt is reporting a headache and requesting pain medication. Dr. Dalene SeltzerSchlossman informed.

## 2016-10-08 NOTE — ED Provider Notes (Signed)
Received care at 4PM from Dr. Adela LankFloyd. Please see his previous history and physical for care. Briefly, this is an 83yo female who presents with concern for hypertension, concern for depression noted by family. Son in law concerned she is not taking her medications appropriately.  Patient medically cleared by Dr. Adela LankFloyd.  BP continues to be elevated to 200s systolic. Given BP meds in ED with improvement.  No history to suggest BP emergency at this time.  TTS consulted and recommend oupt treatment. Patient discharged in stable condition with understanding of reasons to return.    Monica Choi, Monica Kiser, MD 10/10/16 [phone]

## 2016-10-08 NOTE — BH Assessment (Signed)
Tele Assessment Note   Monica CageBarbara Choi is an 83 y.o. female [facility] with complaint of chest pain.  While being treated at the hospital, Pt's son in law requested a psych consult because he was concerned about Pt's behavior earlier today.  Per report, Pt's daughter and son in law attempted to reach Pt by phone several times today, and each time they called, Pt hung up the phone.  Pt provided history.  Pt reported that [place] about six months ago.  She moved to be closer to her daughter.  Since that time, "I haven't been able to live my life."  Pt reported that she feels that her life is controlled by her daughter and son-in-law, and that she just wants to be left alone.  Pt admitted that she hung up the phone on family today, stating that it was because she felt upset at the intrusion.  Pt reported also that she is not fond of her son-in-law and feels that he tries to control her life by managing her money.  Pt endorsed despondency, tearfulness, and isolation due to several stressors, including the move to [place], the death of her husband (still grieving), and what she sees as the intrusiveness of her family.  Pt denied suicidal ideation, past suicide attempts, homicidal ideation, auditory/visual hallucination, self-injurious behavior, and substance use concerns.    Pt denied any current or psychiatric care.  She stated that she would like to be released.  Pt complained of continued pain.  During assessment, Pt presented as alert and oriented.  She had good eye contact and was cooperative in session.  Pt was dressed in scrubs and appeared appropriately groomed. Pt is hard of hearing and author had to repeat questions several times.  Pt's mood was sad and distressed due to pain.  Affect was appropriate to circumstances.  Pt endorsed despondency, isolation, and tearfulness.  Pt denied other depressive symptoms.  Pt's speech was normal in  rate, rhythm, and volume.  Pt's thought processes were within normal range, and thought content was logical.  There was no evidence of delusion.  Pt's memory and concentration were intact.  Impulse control, judgment, and insight were good.  Consulted with Irving BurtonL. Parks, NP.  As Pt is not suicidal, homicidal, self-injurious, or poses an immediate danger to self or others, she does not meet inpatient criteria.  It is recommended that Pt be referred to an appropriate outpatient provider to help her with issues around grief and life transition.  Diagnosis: Major Depressive Disorder, Recurrent, Mild to Moderate  Past Medical History:  Past Medical History:  Diagnosis Date  . Anemia   . Anxiety   . Arrhythmia   . Diverticulitis   . GERD (gastroesophageal reflux disease)   . Hypertension   . Rectal prolapse   . Renal disorder   . Sinus node dysfunction (HCC)   . TIA (transient ischemic attack)     Past Surgical History:  Procedure Laterality Date  . ABDOMINAL HYSTERECTOMY    . ABDOMINAL SURGERY    . CARDIAC SURGERY    . CATARACT EXTRACTION Bilateral   . CHOLECYSTECTOMY    . COLON SURGERY    . HERNIA REPAIR    . KNEE SURGERY    . NASAL SINUS SURGERY      Family History:  Family History  Problem Relation Age of Onset  . Heart attack Father   . Heart attack Brother  Social History:  reports that she has quit smoking. She has never used smokeless tobacco. She reports that she does not drink alcohol or use drugs.  Additional Social History:  Alcohol / Drug Use Pain Medications: See MAR Prescriptions: See MAR Over the Counter: See MAR History of alcohol / drug use?: No history of alcohol / drug abuse  CIWA: CIWA-Ar BP: (!) 221/76 Pulse Rate: 79 COWS:    PATIENT STRENGTHS: (choose at least two) Average or above average intelligence Capable of independent living Communication skills  Allergies:  Allergies  Allergen Reactions  . Morphine Nausea And Vomiting  . Sulfa  Antibiotics Anaphylaxis and Rash  . Amoxicillin Other (See Comments)  . Benzonatate Other (See Comments)  . Buprenorphine Nausea And Vomiting  . Fish-Derived Products Nausea Only  . Penicillins Hives    Has patient had a PCN reaction causing immediate rash, facial/tongue/throat swelling, SOB or lightheadedness with hypotension: Yes Has patient had a PCN reaction causing severe rash involving mucus membranes or skin necrosis: Unknown Has patient had a PCN reaction that required hospitalization: Unknown Has patient had a PCN reaction occurring within the last 10 years: No If all of the above answers are "NO", then may proceed with Cephalosporin use.   . Ciprofloxacin Itching and Rash  . Morphine And Related Nausea And Vomiting    Home Medications:  (Not in a hospital admission)  OB/GYN Status:  No LMP recorded. Patient has had a hysterectomy.  General Assessment Data Location of Assessment: [facility] ED TTS Assessment: In system Is this a Tele or Face-to-Face Assessment?: Tele Assessment Is this an Initial Assessment or a Re-assessment for this encounter?: Initial Assessment Marital status: Widowed Is patient pregnant?: No Pregnancy Status: No Living Arrangements: Alone Can pt return to current living arrangement?: Yes Admission Status: Voluntary Is patient capable of signing voluntary admission?: Yes Referral Source: Self/Family/Friend Insurance type: IT sales professional     Crisis Care Plan Living Arrangements: Alone Legal Guardian:  (None indicated) Name of Psychiatrist: None Name of Therapist: None  Education Status Is patient currently in school?: No  Risk to self with the past 6 months Suicidal Ideation: No Has patient been a risk to self within the past 6 months prior to admission? : No Suicidal Intent: No Has patient had any suicidal intent within the past 6 months prior to admission? : No Is patient at risk for suicide?: No Suicidal Plan?: No Has patient had any  suicidal plan within the past 6 months prior to admission? : Other (comment) Access to Means: No What has been your use of drugs/alcohol within the last 12 months?: Denied Previous Attempts/Gestures: No Intentional Self Injurious Behavior: None Family Suicide History: No Recent stressful life event(s): Other (Comment), Conflict (Comment) (Conflict w/son-in-law; move from [place]; grieving death hus) Persecutory voices/beliefs?: No Depression: Yes Depression Symptoms: Despondent, Tearfulness, Isolating Substance abuse history and/or treatment for substance abuse?: No Suicide prevention information given to non-admitted patients: Not applicable  Risk to Others within the past 6 months Homicidal Ideation: No Does patient have any lifetime risk of violence toward others beyond the six months prior to admission? : No Thoughts of Harm to Others: No Current Homicidal Intent: No Current Homicidal Plan: No Access to Homicidal Means: No History of harm to others?: No Assessment of Violence: None Noted Does patient have access to weapons?: No Criminal Charges Pending?: No Does patient have a court date: No Is patient on probation?: No  Psychosis Hallucinations: None noted Delusions: None noted  Mental Status  Report Appearance/Hygiene: Unremarkable, In scrubs Eye Contact: Good Motor Activity: Freedom of movement, Unremarkable Speech: Logical/coherent Level of Consciousness: Alert Mood: Sad Affect: Appropriate to circumstance Anxiety Level: None Thought Processes: Coherent, Relevant Judgement: Unimpaired Orientation: Person, Place, Time, Situation Obsessive Compulsive Thoughts/Behaviors: None  Cognitive Functioning Concentration: Normal Memory: Recent Intact, Remote Intact IQ: Average Insight: Good Impulse Control: Good Appetite: Fair Sleep: No Change Vegetative Symptoms: None  ADLScreening [facility] Assessment Services) Patient's cognitive ability adequate to safely complete  daily activities?: Yes Patient able to express need for assistance with ADLs?: Yes Independently performs ADLs?: Yes (appropriate for developmental age)  Prior Inpatient Therapy Prior Inpatient Therapy: No  Prior Outpatient Therapy Prior Outpatient Therapy: No Does patient have an ACCT team?: No Does patient have Intensive In-House Services?  : No Does patient have [facility] services? : No Does patient have P4CC services?: No  ADL Screening (condition at time of admission) Patient's cognitive ability adequate to safely complete daily activities?: Yes Is the patient deaf or have difficulty hearing?: Yes Does the patient have difficulty seeing, even when wearing glasses/contacts?: No Does the patient have difficulty concentrating, remembering, or making decisions?: No Patient able to express need for assistance with ADLs?: Yes Does the patient have difficulty dressing or bathing?: No Independently performs ADLs?: Yes (appropriate for developmental age) Does the patient have difficulty walking or climbing stairs?: No Weakness of Legs: None Weakness of Arms/Hands: None  Home Assistive Devices/Equipment Home Assistive Devices/Equipment: None  Therapy Consults (therapy consults require a physician order) PT Evaluation Needed: No OT Evalulation Needed: No SLP Evaluation Needed: No Abuse/Neglect Assessment (Assessment to be complete while patient is alone) Physical Abuse: Denies Verbal Abuse: Denies Sexual Abuse: Denies Exploitation of patient/patient's resources: Denies Self-Neglect: Denies Values / Beliefs Cultural Requests During Hospitalization: None Spiritual Requests During Hospitalization: None Consults Spiritual Care Consult Needed: No Social Work Consult Needed: No Merchant navy officer (For Healthcare) Does Patient Have a Programmer, multimedia?: No Type of Advance Directive: Midwife, Living will    Additional Information 1:1 In Past 12 Months?:  No CIRT Risk: No Elopement Risk: No Does patient have medical clearance?: Yes     Disposition:  Disposition Initial Assessment Completed for this Encounter: Yes Disposition of Patient: Outpatient treatment Type of outpatient treatment: Adult (Per L. Parks NP, Pt would benefit from outpatient therapy)  Earline Mayotte 10/08/2016 4:35 PM

## 2016-10-09 LAB — URINE CULTURE

## 2016-10-10 ENCOUNTER — Other Ambulatory Visit: Payer: Self-pay

## 2016-10-10 NOTE — Patient Outreach (Signed)
[facility] Care Management  10/10/2016  [place] CageBarbara Kenner 01/04/1934 [id number]   Telephone call to patient for ED Utilization Screening. Patient answers and reason for call given.  Patient not able to quite verify HIPAA.  Offered to talk with DPR- Lennie HummerAlan Cagle but patient refused.  Patient asked for a call another time.    Plan: RN Health Coach will attempt patient again within 10 business days.    Bary Lericheionne J Edel Rivero, RN, MSN [facility] Care Management RN Telephonic Health Coach [phone]

## 2016-10-13 ENCOUNTER — Other Ambulatory Visit: Payer: Self-pay

## 2016-10-13 NOTE — Patient Outreach (Signed)
[facility] Care Management  10/13/2016  Monica CageBarbara Choi 09/27/1933 [id number]   Telephone call to patient for ED screening. Patient reports she is doing better and that she tires easily.  Patient reports that her blood pressure is better.  Patient reports that she has home health right now.  Patient has chosen to hold off on services.  Patient agreeable to receive letter and brochure for future reference.    Plan: RN Health Coach will send letter and brochure.  RN Health Coach will notify care management assistant of case status.  Bary Lericheionne J Khila Papp, RN, MSN [facility] Care Management RN Telephonic Health Coach [phone]

## 2016-10-14 DIAGNOSIS — F3340 Major depressive disorder, recurrent, in remission, unspecified: Secondary | ICD-10-CM | POA: Diagnosis not present

## 2016-10-15 DIAGNOSIS — M109 Gout, unspecified: Secondary | ICD-10-CM | POA: Diagnosis not present

## 2016-10-15 DIAGNOSIS — Z7982 Long term (current) use of aspirin: Secondary | ICD-10-CM | POA: Diagnosis not present

## 2016-10-15 DIAGNOSIS — E1122 Type 2 diabetes mellitus with diabetic chronic kidney disease: Secondary | ICD-10-CM | POA: Diagnosis not present

## 2016-10-15 DIAGNOSIS — I495 Sick sinus syndrome: Secondary | ICD-10-CM | POA: Diagnosis not present

## 2016-10-15 DIAGNOSIS — I129 Hypertensive chronic kidney disease with stage 1 through stage 4 chronic kidney disease, or unspecified chronic kidney disease: Secondary | ICD-10-CM | POA: Diagnosis not present

## 2016-10-15 DIAGNOSIS — E785 Hyperlipidemia, unspecified: Secondary | ICD-10-CM | POA: Diagnosis not present

## 2016-10-15 DIAGNOSIS — D631 Anemia in chronic kidney disease: Secondary | ICD-10-CM | POA: Diagnosis not present

## 2016-10-15 DIAGNOSIS — N183 Chronic kidney disease, stage 3 (moderate): Secondary | ICD-10-CM | POA: Diagnosis not present

## 2016-10-15 DIAGNOSIS — F329 Major depressive disorder, single episode, unspecified: Secondary | ICD-10-CM | POA: Diagnosis not present

## 2016-10-15 DIAGNOSIS — Z950 Presence of cardiac pacemaker: Secondary | ICD-10-CM | POA: Diagnosis not present

## 2016-10-15 DIAGNOSIS — K5730 Diverticulosis of large intestine without perforation or abscess without bleeding: Secondary | ICD-10-CM | POA: Diagnosis not present

## 2016-10-15 DIAGNOSIS — Z87891 Personal history of nicotine dependence: Secondary | ICD-10-CM | POA: Diagnosis not present

## 2016-10-16 ENCOUNTER — Ambulatory Visit (INDEPENDENT_AMBULATORY_CARE_PROVIDER_SITE_OTHER): Payer: PPO | Admitting: *Deleted

## 2016-10-16 DIAGNOSIS — I495 Sick sinus syndrome: Secondary | ICD-10-CM

## 2016-10-16 LAB — CUP PACEART INCLINIC DEVICE CHECK
Battery Remaining Longevity: 140 mo
Battery Voltage: 3.01 V
Brady Statistic RA Percent Paced: 8.1 %
Brady Statistic RV Percent Paced: 0.1 %
Implantable Lead Implant Date: 20160108
Implantable Lead Implant Date: 20160108
Implantable Lead Location: 753862
Implantable Pulse Generator Implant Date: 20160108
Lead Channel Impedance Value: 400 Ohm
Lead Channel Impedance Value: 512.5 Ohm
Lead Channel Pacing Threshold Amplitude: 0.5 V
Lead Channel Pacing Threshold Pulse Width: 0.4 ms
Lead Channel Pacing Threshold Pulse Width: 0.4 ms
Lead Channel Sensing Intrinsic Amplitude: 5 mV
Lead Channel Setting Pacing Amplitude: 1.5 V
Lead Channel Setting Sensing Sensitivity: 2 mV
MDC IDC LEAD LOCATION: 753862
MDC IDC MSMT LEADCHNL RV PACING THRESHOLD AMPLITUDE: 1.25 V
MDC IDC MSMT LEADCHNL RV SENSING INTR AMPL: 8.2 mV
MDC IDC PG SERIAL: 7661751
MDC IDC SESS DTM: 20180712160656
MDC IDC SET LEADCHNL RA PACING AMPLITUDE: 1.5 V
MDC IDC SET LEADCHNL RV PACING PULSEWIDTH: 0.4 ms
Pulse Gen Model: 2240

## 2016-10-16 NOTE — Progress Notes (Signed)
Pacemaker check in clinic. Normal device function. Thresholds, sensing, impedances consistent with previous measurements. Device programmed to maximize longevity. 5 mode switches (<1%). No high ventricular rates noted. Device programmed at appropriate safety margins. Histogram distribution appropriate for patient activity level. Device programmed to optimize intrinsic conduction. Estimated longevity 10.4-11.2436yrs. Home monitor paired in office. Remte check 10/11 and ROV with WC4/2019

## 2016-10-20 ENCOUNTER — Other Ambulatory Visit: Payer: Self-pay | Admitting: Family Medicine

## 2016-10-20 DIAGNOSIS — R413 Other amnesia: Secondary | ICD-10-CM | POA: Diagnosis not present

## 2016-10-20 DIAGNOSIS — I10 Essential (primary) hypertension: Secondary | ICD-10-CM | POA: Diagnosis not present

## 2016-10-20 DIAGNOSIS — R109 Unspecified abdominal pain: Secondary | ICD-10-CM | POA: Diagnosis not present

## 2016-10-20 DIAGNOSIS — K5900 Constipation, unspecified: Secondary | ICD-10-CM | POA: Diagnosis not present

## 2016-10-20 DIAGNOSIS — G459 Transient cerebral ischemic attack, unspecified: Secondary | ICD-10-CM | POA: Diagnosis not present

## 2016-10-27 DIAGNOSIS — Z7982 Long term (current) use of aspirin: Secondary | ICD-10-CM | POA: Diagnosis not present

## 2016-10-27 DIAGNOSIS — I129 Hypertensive chronic kidney disease with stage 1 through stage 4 chronic kidney disease, or unspecified chronic kidney disease: Secondary | ICD-10-CM | POA: Diagnosis not present

## 2016-10-27 DIAGNOSIS — N183 Chronic kidney disease, stage 3 (moderate): Secondary | ICD-10-CM | POA: Diagnosis not present

## 2016-10-27 DIAGNOSIS — F329 Major depressive disorder, single episode, unspecified: Secondary | ICD-10-CM | POA: Diagnosis not present

## 2016-10-27 DIAGNOSIS — Z87891 Personal history of nicotine dependence: Secondary | ICD-10-CM | POA: Diagnosis not present

## 2016-10-27 DIAGNOSIS — E785 Hyperlipidemia, unspecified: Secondary | ICD-10-CM | POA: Diagnosis not present

## 2016-10-27 DIAGNOSIS — E1122 Type 2 diabetes mellitus with diabetic chronic kidney disease: Secondary | ICD-10-CM | POA: Diagnosis not present

## 2016-10-27 DIAGNOSIS — M109 Gout, unspecified: Secondary | ICD-10-CM | POA: Diagnosis not present

## 2016-10-27 DIAGNOSIS — I495 Sick sinus syndrome: Secondary | ICD-10-CM | POA: Diagnosis not present

## 2016-10-27 DIAGNOSIS — D631 Anemia in chronic kidney disease: Secondary | ICD-10-CM | POA: Diagnosis not present

## 2016-10-27 DIAGNOSIS — Z950 Presence of cardiac pacemaker: Secondary | ICD-10-CM | POA: Diagnosis not present

## 2016-10-27 DIAGNOSIS — K5730 Diverticulosis of large intestine without perforation or abscess without bleeding: Secondary | ICD-10-CM | POA: Diagnosis not present

## 2016-10-31 DIAGNOSIS — I495 Sick sinus syndrome: Secondary | ICD-10-CM | POA: Diagnosis not present

## 2016-10-31 DIAGNOSIS — I129 Hypertensive chronic kidney disease with stage 1 through stage 4 chronic kidney disease, or unspecified chronic kidney disease: Secondary | ICD-10-CM | POA: Diagnosis not present

## 2016-10-31 DIAGNOSIS — E785 Hyperlipidemia, unspecified: Secondary | ICD-10-CM | POA: Diagnosis not present

## 2016-10-31 DIAGNOSIS — N183 Chronic kidney disease, stage 3 (moderate): Secondary | ICD-10-CM | POA: Diagnosis not present

## 2016-10-31 DIAGNOSIS — Z950 Presence of cardiac pacemaker: Secondary | ICD-10-CM | POA: Diagnosis not present

## 2016-10-31 DIAGNOSIS — F329 Major depressive disorder, single episode, unspecified: Secondary | ICD-10-CM | POA: Diagnosis not present

## 2016-10-31 DIAGNOSIS — M109 Gout, unspecified: Secondary | ICD-10-CM | POA: Diagnosis not present

## 2016-10-31 DIAGNOSIS — E1122 Type 2 diabetes mellitus with diabetic chronic kidney disease: Secondary | ICD-10-CM | POA: Diagnosis not present

## 2016-10-31 DIAGNOSIS — Z87891 Personal history of nicotine dependence: Secondary | ICD-10-CM | POA: Diagnosis not present

## 2016-10-31 DIAGNOSIS — Z7982 Long term (current) use of aspirin: Secondary | ICD-10-CM | POA: Diagnosis not present

## 2016-10-31 DIAGNOSIS — K5730 Diverticulosis of large intestine without perforation or abscess without bleeding: Secondary | ICD-10-CM | POA: Diagnosis not present

## 2016-10-31 DIAGNOSIS — D631 Anemia in chronic kidney disease: Secondary | ICD-10-CM | POA: Diagnosis not present

## 2016-11-05 DIAGNOSIS — M6282 Rhabdomyolysis: Secondary | ICD-10-CM

## 2016-11-05 HISTORY — DX: Rhabdomyolysis: M62.82

## 2016-11-06 DIAGNOSIS — Z7982 Long term (current) use of aspirin: Secondary | ICD-10-CM | POA: Diagnosis not present

## 2016-11-06 DIAGNOSIS — K5730 Diverticulosis of large intestine without perforation or abscess without bleeding: Secondary | ICD-10-CM | POA: Diagnosis not present

## 2016-11-06 DIAGNOSIS — E785 Hyperlipidemia, unspecified: Secondary | ICD-10-CM | POA: Diagnosis not present

## 2016-11-06 DIAGNOSIS — E1122 Type 2 diabetes mellitus with diabetic chronic kidney disease: Secondary | ICD-10-CM | POA: Diagnosis not present

## 2016-11-06 DIAGNOSIS — I495 Sick sinus syndrome: Secondary | ICD-10-CM | POA: Diagnosis not present

## 2016-11-06 DIAGNOSIS — N183 Chronic kidney disease, stage 3 (moderate): Secondary | ICD-10-CM | POA: Diagnosis not present

## 2016-11-06 DIAGNOSIS — Z87891 Personal history of nicotine dependence: Secondary | ICD-10-CM | POA: Diagnosis not present

## 2016-11-06 DIAGNOSIS — M109 Gout, unspecified: Secondary | ICD-10-CM | POA: Diagnosis not present

## 2016-11-06 DIAGNOSIS — I129 Hypertensive chronic kidney disease with stage 1 through stage 4 chronic kidney disease, or unspecified chronic kidney disease: Secondary | ICD-10-CM | POA: Diagnosis not present

## 2016-11-06 DIAGNOSIS — F329 Major depressive disorder, single episode, unspecified: Secondary | ICD-10-CM | POA: Diagnosis not present

## 2016-11-06 DIAGNOSIS — Z950 Presence of cardiac pacemaker: Secondary | ICD-10-CM | POA: Diagnosis not present

## 2016-11-06 DIAGNOSIS — D631 Anemia in chronic kidney disease: Secondary | ICD-10-CM | POA: Diagnosis not present

## 2016-11-21 DIAGNOSIS — F3340 Major depressive disorder, recurrent, in remission, unspecified: Secondary | ICD-10-CM | POA: Diagnosis not present

## 2016-11-23 ENCOUNTER — Other Ambulatory Visit: Payer: Self-pay | Admitting: Family Medicine

## 2016-11-25 ENCOUNTER — Inpatient Hospital Stay (HOSPITAL_COMMUNITY)
Admission: EM | Admit: 2016-11-25 | Discharge: 2016-11-28 | DRG: 683 | Disposition: A | Discharge status: Skilled Nursing Facility | Payer: PPO | Attending: Family Medicine | Admitting: Family Medicine

## 2016-11-25 ENCOUNTER — Emergency Department (HOSPITAL_COMMUNITY): Payer: PPO

## 2016-11-25 ENCOUNTER — Encounter (HOSPITAL_COMMUNITY): Payer: Self-pay

## 2016-11-25 DIAGNOSIS — I951 Orthostatic hypotension: Secondary | ICD-10-CM | POA: Diagnosis present

## 2016-11-25 DIAGNOSIS — N179 Acute kidney failure, unspecified: Secondary | ICD-10-CM | POA: Diagnosis not present

## 2016-11-25 DIAGNOSIS — E876 Hypokalemia: Secondary | ICD-10-CM | POA: Diagnosis not present

## 2016-11-25 DIAGNOSIS — S199XXA Unspecified injury of neck, initial encounter: Secondary | ICD-10-CM | POA: Diagnosis not present

## 2016-11-25 DIAGNOSIS — F0390 Unspecified dementia without behavioral disturbance: Secondary | ICD-10-CM | POA: Diagnosis not present

## 2016-11-25 DIAGNOSIS — F329 Major depressive disorder, single episode, unspecified: Secondary | ICD-10-CM | POA: Diagnosis not present

## 2016-11-25 DIAGNOSIS — N183 Chronic kidney disease, stage 3 (moderate): Secondary | ICD-10-CM | POA: Diagnosis not present

## 2016-11-25 DIAGNOSIS — Z885 Allergy status to narcotic agent status: Secondary | ICD-10-CM

## 2016-11-25 DIAGNOSIS — R0789 Other chest pain: Secondary | ICD-10-CM | POA: Diagnosis not present

## 2016-11-25 DIAGNOSIS — I129 Hypertensive chronic kidney disease with stage 1 through stage 4 chronic kidney disease, or unspecified chronic kidney disease: Secondary | ICD-10-CM | POA: Diagnosis not present

## 2016-11-25 DIAGNOSIS — R4182 Altered mental status, unspecified: Secondary | ICD-10-CM | POA: Diagnosis not present

## 2016-11-25 DIAGNOSIS — S2241XA Multiple fractures of ribs, right side, initial encounter for closed fracture: Secondary | ICD-10-CM | POA: Diagnosis present

## 2016-11-25 DIAGNOSIS — T148XXA Other injury of unspecified body region, initial encounter: Secondary | ICD-10-CM | POA: Diagnosis not present

## 2016-11-25 DIAGNOSIS — E1151 Type 2 diabetes mellitus with diabetic peripheral angiopathy without gangrene: Secondary | ICD-10-CM | POA: Diagnosis not present

## 2016-11-25 DIAGNOSIS — E860 Dehydration: Secondary | ICD-10-CM

## 2016-11-25 DIAGNOSIS — R748 Abnormal levels of other serum enzymes: Secondary | ICD-10-CM | POA: Diagnosis present

## 2016-11-25 DIAGNOSIS — Z91013 Allergy to seafood: Secondary | ICD-10-CM

## 2016-11-25 DIAGNOSIS — R2681 Unsteadiness on feet: Secondary | ICD-10-CM | POA: Diagnosis not present

## 2016-11-25 DIAGNOSIS — E119 Type 2 diabetes mellitus without complications: Secondary | ICD-10-CM | POA: Diagnosis not present

## 2016-11-25 DIAGNOSIS — Z8673 Personal history of transient ischemic attack (TIA), and cerebral infarction without residual deficits: Secondary | ICD-10-CM | POA: Diagnosis not present

## 2016-11-25 DIAGNOSIS — M109 Gout, unspecified: Secondary | ICD-10-CM | POA: Diagnosis not present

## 2016-11-25 DIAGNOSIS — Z87891 Personal history of nicotine dependence: Secondary | ICD-10-CM

## 2016-11-25 DIAGNOSIS — R079 Chest pain, unspecified: Secondary | ICD-10-CM | POA: Diagnosis not present

## 2016-11-25 DIAGNOSIS — S2241XD Multiple fractures of ribs, right side, subsequent encounter for fracture with routine healing: Secondary | ICD-10-CM | POA: Diagnosis not present

## 2016-11-25 DIAGNOSIS — R51 Headache: Secondary | ICD-10-CM | POA: Diagnosis not present

## 2016-11-25 DIAGNOSIS — K219 Gastro-esophageal reflux disease without esophagitis: Secondary | ICD-10-CM | POA: Diagnosis present

## 2016-11-25 DIAGNOSIS — J302 Other seasonal allergic rhinitis: Secondary | ICD-10-CM | POA: Diagnosis not present

## 2016-11-25 DIAGNOSIS — Z888 Allergy status to other drugs, medicaments and biological substances status: Secondary | ICD-10-CM

## 2016-11-25 DIAGNOSIS — W19XXXA Unspecified fall, initial encounter: Secondary | ICD-10-CM | POA: Diagnosis not present

## 2016-11-25 DIAGNOSIS — T796XXA Traumatic ischemia of muscle, initial encounter: Secondary | ICD-10-CM | POA: Diagnosis not present

## 2016-11-25 DIAGNOSIS — R2689 Other abnormalities of gait and mobility: Secondary | ICD-10-CM | POA: Diagnosis not present

## 2016-11-25 DIAGNOSIS — I517 Cardiomegaly: Secondary | ICD-10-CM | POA: Diagnosis not present

## 2016-11-25 DIAGNOSIS — E1122 Type 2 diabetes mellitus with diabetic chronic kidney disease: Secondary | ICD-10-CM | POA: Diagnosis not present

## 2016-11-25 DIAGNOSIS — Z880 Allergy status to penicillin: Secondary | ICD-10-CM

## 2016-11-25 DIAGNOSIS — Z7982 Long term (current) use of aspirin: Secondary | ICD-10-CM | POA: Diagnosis not present

## 2016-11-25 DIAGNOSIS — Z79899 Other long term (current) drug therapy: Secondary | ICD-10-CM | POA: Diagnosis not present

## 2016-11-25 DIAGNOSIS — Z90710 Acquired absence of both cervix and uterus: Secondary | ICD-10-CM

## 2016-11-25 DIAGNOSIS — D649 Anemia, unspecified: Secondary | ICD-10-CM | POA: Diagnosis present

## 2016-11-25 DIAGNOSIS — Z9181 History of falling: Secondary | ICD-10-CM | POA: Diagnosis not present

## 2016-11-25 DIAGNOSIS — G8929 Other chronic pain: Secondary | ICD-10-CM | POA: Diagnosis not present

## 2016-11-25 DIAGNOSIS — I10 Essential (primary) hypertension: Secondary | ICD-10-CM | POA: Diagnosis not present

## 2016-11-25 DIAGNOSIS — F419 Anxiety disorder, unspecified: Secondary | ICD-10-CM | POA: Diagnosis not present

## 2016-11-25 DIAGNOSIS — S299XXA Unspecified injury of thorax, initial encounter: Secondary | ICD-10-CM | POA: Diagnosis not present

## 2016-11-25 DIAGNOSIS — Z9049 Acquired absence of other specified parts of digestive tract: Secondary | ICD-10-CM | POA: Diagnosis not present

## 2016-11-25 DIAGNOSIS — E569 Vitamin deficiency, unspecified: Secondary | ICD-10-CM | POA: Diagnosis not present

## 2016-11-25 DIAGNOSIS — Z8249 Family history of ischemic heart disease and other diseases of the circulatory system: Secondary | ICD-10-CM

## 2016-11-25 DIAGNOSIS — E785 Hyperlipidemia, unspecified: Secondary | ICD-10-CM | POA: Diagnosis not present

## 2016-11-25 DIAGNOSIS — Z950 Presence of cardiac pacemaker: Secondary | ICD-10-CM

## 2016-11-25 DIAGNOSIS — M6281 Muscle weakness (generalized): Secondary | ICD-10-CM | POA: Diagnosis not present

## 2016-11-25 DIAGNOSIS — S0990XA Unspecified injury of head, initial encounter: Secondary | ICD-10-CM | POA: Diagnosis not present

## 2016-11-25 DIAGNOSIS — M6282 Rhabdomyolysis: Secondary | ICD-10-CM | POA: Diagnosis not present

## 2016-11-25 DIAGNOSIS — Z881 Allergy status to other antibiotic agents status: Secondary | ICD-10-CM

## 2016-11-25 DIAGNOSIS — S41109A Unspecified open wound of unspecified upper arm, initial encounter: Secondary | ICD-10-CM | POA: Diagnosis not present

## 2016-11-25 DIAGNOSIS — F09 Unspecified mental disorder due to known physiological condition: Secondary | ICD-10-CM | POA: Diagnosis not present

## 2016-11-25 DIAGNOSIS — S3993XA Unspecified injury of pelvis, initial encounter: Secondary | ICD-10-CM | POA: Diagnosis not present

## 2016-11-25 HISTORY — DX: Type 2 diabetes mellitus without complications: E11.9

## 2016-11-25 HISTORY — DX: Rhabdomyolysis: M62.82

## 2016-11-25 HISTORY — DX: Presence of cardiac pacemaker: Z95.0

## 2016-11-25 HISTORY — DX: Sick sinus syndrome: I49.5

## 2016-11-25 LAB — COMPREHENSIVE METABOLIC PANEL
ALBUMIN: 3.6 g/dL (ref 3.5–5.0)
ALT: 43 U/L (ref 14–54)
AST: 61 U/L — AB (ref 15–41)
Alkaline Phosphatase: 79 U/L (ref 38–126)
Anion gap: 14 (ref 5–15)
BUN: 61 mg/dL — AB (ref 6–20)
CHLORIDE: 102 mmol/L (ref 101–111)
CO2: 25 mmol/L (ref 22–32)
CREATININE: 1.79 mg/dL — AB (ref 0.44–1.00)
Calcium: 9.5 mg/dL (ref 8.9–10.3)
GFR calc Af Amer: 30 mL/min — ABNORMAL LOW (ref >60)
GFR calc non Af Amer: 26 mL/min — ABNORMAL LOW (ref >60)
GLUCOSE: 216 mg/dL — AB (ref 65–99)
POTASSIUM: 3.4 mmol/L — AB (ref 3.5–5.1)
SODIUM: 141 mmol/L (ref 135–145)
Total Bilirubin: 1.3 mg/dL — ABNORMAL HIGH (ref 0.3–1.2)
Total Protein: 6.6 g/dL (ref 6.5–8.1)

## 2016-11-25 LAB — CBC WITH DIFFERENTIAL/PLATELET
BASOS ABS: 0 10*3/uL (ref 0.0–0.1)
BASOS ABS: 0 10*3/uL (ref 0.0–0.1)
BASOS PCT: 0 %
Basophils Relative: 0 %
EOS ABS: 0 10*3/uL (ref 0.0–0.7)
EOS PCT: 0 %
Eosinophils Absolute: 0 10*3/uL (ref 0.0–0.7)
Eosinophils Relative: 0 %
HEMATOCRIT: 45 % (ref 36.0–46.0)
HEMATOCRIT: 42.1 % (ref 36.0–46.0)
HEMOGLOBIN: 13.8 g/dL (ref 12.0–15.0)
Hemoglobin: 15.5 g/dL — ABNORMAL HIGH (ref 12.0–15.0)
LYMPHS ABS: 2.4 10*3/uL (ref 0.7–4.0)
LYMPHS PCT: 15 %
Lymphocytes Relative: 15 %
Lymphs Abs: 2.3 10*3/uL (ref 0.7–4.0)
MCH: 30.6 pg (ref 26.0–34.0)
MCH: 29.6 pg (ref 26.0–34.0)
MCHC: 34.4 g/dL (ref 30.0–36.0)
MCHC: 32.8 g/dL (ref 30.0–36.0)
MCV: 88.9 fL (ref 78.0–100.0)
MCV: 90.3 fL (ref 78.0–100.0)
MONO ABS: 1 10*3/uL (ref 0.1–1.0)
Monocytes Absolute: 1.1 10*3/uL — ABNORMAL HIGH (ref 0.1–1.0)
Monocytes Relative: 6 %
Monocytes Relative: 7 %
NEUTROS PCT: 78 %
Neutro Abs: 12.9 10*3/uL — ABNORMAL HIGH (ref 1.7–7.7)
Neutro Abs: 12.2 10*3/uL — ABNORMAL HIGH (ref 1.7–7.7)
Neutrophils Relative %: 79 %
PLATELETS: 184 10*3/uL (ref 150–400)
Platelets: 233 10*3/uL (ref 150–400)
RBC: 5.06 MIL/uL (ref 3.87–5.11)
RBC: 4.66 MIL/uL (ref 3.87–5.11)
RDW: 13.3 % (ref 11.5–15.5)
RDW: 13.2 % (ref 11.5–15.5)
WBC: 16.4 10*3/uL — ABNORMAL HIGH (ref 4.0–10.5)
WBC: 15.7 10*3/uL — AB (ref 4.0–10.5)

## 2016-11-25 LAB — URINALYSIS, ROUTINE W REFLEX MICROSCOPIC
Bilirubin Urine: NEGATIVE
Glucose, UA: 50 mg/dL — AB
Hgb urine dipstick: NEGATIVE
KETONES UR: NEGATIVE mg/dL
LEUKOCYTES UA: NEGATIVE
Nitrite: NEGATIVE
Specific Gravity, Urine: 1.017 (ref 1.005–1.030)
pH: 5 (ref 5.0–8.0)

## 2016-11-25 LAB — CK: CK TOTAL: 989 U/L — AB (ref 38–234)

## 2016-11-25 LAB — TROPONIN I
TROPONIN I: 0.06 ng/mL — AB (ref <0.03)
Troponin I: 0.1 ng/mL (ref <0.03)

## 2016-11-25 LAB — GLUCOSE, CAPILLARY: GLUCOSE-CAPILLARY: 167 mg/dL — AB (ref 65–99)

## 2016-11-25 MED ORDER — LORATADINE 10 MG PO TABS
10.0000 mg | ORAL_TABLET | Freq: Every day | ORAL | Status: DC | PRN
  Administered 2016-11-26 (×2): 10 mg via ORAL
  Filled 2016-11-25 (×2): qty 1

## 2016-11-25 MED ORDER — NITROGLYCERIN 0.4 MG SL SUBL
0.4000 mg | SUBLINGUAL_TABLET | SUBLINGUAL | Status: DC | PRN
  Filled 2016-11-25: qty 1

## 2016-11-25 MED ORDER — INSULIN ASPART 100 UNIT/ML ~~LOC~~ SOLN
0.0000 [IU] | Freq: Three times a day (TID) | SUBCUTANEOUS | Status: DC
  Administered 2016-11-26: 2 [IU] via SUBCUTANEOUS
  Administered 2016-11-26: 8 [IU] via SUBCUTANEOUS
  Administered 2016-11-26: 3 [IU] via SUBCUTANEOUS
  Administered 2016-11-27: 2 [IU] via SUBCUTANEOUS
  Administered 2016-11-27: 8 [IU] via SUBCUTANEOUS
  Administered 2016-11-27 – 2016-11-28 (×2): 3 [IU] via SUBCUTANEOUS
  Administered 2016-11-28: 5 [IU] via SUBCUTANEOUS

## 2016-11-25 MED ORDER — ASPIRIN EC 81 MG PO TBEC
81.0000 mg | DELAYED_RELEASE_TABLET | Freq: Every day | ORAL | Status: DC
  Administered 2016-11-26 – 2016-11-28 (×3): 81 mg via ORAL
  Filled 2016-11-25 (×3): qty 1

## 2016-11-25 MED ORDER — ONDANSETRON HCL 4 MG/2ML IJ SOLN: 4.0000 mg | Freq: Four times a day (QID) | INTRAMUSCULAR | Status: DC | PRN

## 2016-11-25 MED ORDER — ONDANSETRON HCL 4 MG PO TABS
4.0000 mg | ORAL_TABLET | Freq: Four times a day (QID) | ORAL | Status: DC | PRN
Start: 2016-11-25 — End: 2016-11-28

## 2016-11-25 MED ORDER — POLYETHYLENE GLYCOL 3350 17 G PO PACK: 17.0000 g | PACK | Freq: Every day | ORAL | Status: DC | PRN

## 2016-11-25 MED ORDER — PANTOPRAZOLE SODIUM 20 MG PO TBEC
20.0000 mg | DELAYED_RELEASE_TABLET | Freq: Every day | ORAL | Status: DC
  Administered 2016-11-26 – 2016-11-28 (×3): 20 mg via ORAL
  Filled 2016-11-25 (×3): qty 1

## 2016-11-25 MED ORDER — ALLOPURINOL 100 MG PO TABS: 100.0000 mg | ORAL_TABLET | Freq: Every day | ORAL | Status: DC | PRN

## 2016-11-25 MED ORDER — SODIUM BICARBONATE 650 MG PO TABS
650.0000 mg | ORAL_TABLET | Freq: Two times a day (BID) | ORAL | Status: DC
  Administered 2016-11-25 – 2016-11-28 (×6): 650 mg via ORAL
  Filled 2016-11-25 (×6): qty 1

## 2016-11-25 MED ORDER — CARVEDILOL 12.5 MG PO TABS
12.5000 mg | ORAL_TABLET | Freq: Two times a day (BID) | ORAL | Status: DC
  Administered 2016-11-26 – 2016-11-28 (×5): 12.5 mg via ORAL
  Filled 2016-11-25 (×5): qty 1

## 2016-11-25 MED ORDER — ENOXAPARIN SODIUM 30 MG/0.3ML ~~LOC~~ SOLN
30.0000 mg | SUBCUTANEOUS | Status: DC
Start: 2016-11-26 — End: 2016-11-28
  Administered 2016-11-26 – 2016-11-28 (×3): 30 mg via SUBCUTANEOUS
  Filled 2016-11-25 (×3): qty 0.3

## 2016-11-25 MED ORDER — SODIUM CHLORIDE 0.9 % IV SOLN
INTRAVENOUS | Status: DC
  Administered 2016-11-25 – 2016-11-28 (×5): via INTRAVENOUS

## 2016-11-25 MED ORDER — ATORVASTATIN CALCIUM 40 MG PO TABS: 40.0000 mg | ORAL_TABLET | Freq: Every day | ORAL | Status: DC

## 2016-11-25 MED ORDER — ACETAMINOPHEN 650 MG RE SUPP: 650.0000 mg | Freq: Four times a day (QID) | RECTAL | Status: DC | PRN

## 2016-11-25 MED ORDER — DULOXETINE HCL 30 MG PO CPEP
30.0000 mg | ORAL_CAPSULE | Freq: Two times a day (BID) | ORAL | Status: DC
  Administered 2016-11-25 – 2016-11-28 (×6): 30 mg via ORAL
  Filled 2016-11-25 (×6): qty 1

## 2016-11-25 MED ORDER — ACETAMINOPHEN 325 MG PO TABS
650.0000 mg | ORAL_TABLET | Freq: Four times a day (QID) | ORAL | Status: DC | PRN
  Administered 2016-11-26 – 2016-11-27 (×3): 650 mg via ORAL
  Filled 2016-11-25 (×3): qty 2

## 2016-11-25 MED ORDER — SODIUM CHLORIDE 0.9 % IV BOLUS (SEPSIS)
1000.0000 mL | Freq: Once | INTRAVENOUS | Status: AC
  Administered 2016-11-25: 1000 mL via INTRAVENOUS

## 2016-11-25 NOTE — ED Notes (Signed)
Attempted report x 1; name and call back number provided 

## 2016-11-25 NOTE — ED Notes (Signed)
IV team at bedside 

## 2016-11-25 NOTE — ED Triage Notes (Addendum)
Pt from home post fall, pt was on the ground for at least 24 hours and was last known well on Sunday evening. Pt has several cuts on hands and feet that are scabbed over.

## 2016-11-25 NOTE — ED Notes (Signed)
RN attempted to stick pt for blood x2

## 2016-11-25 NOTE — H&P (Signed)
Family Medicine Teaching [facility] Admission History and Physical Service Pager: [phone]  Patient name: Monica Choi Medical record number: [id number] Date of birth: 08-21-1933 Age: 83 y.o. Gender: female  Primary Care Provider: Macy Mis, MD Consultants: None   Code Status: Full (per discussion admission)   Chief Complaint: Fall  Assessment and Plan: Monica Choi is a 83 y.o. female presenting with a fall . PMH is significant for anemia, anxiety, GERD, hypertension, Sick Sinus Syndrome w/ pacemaker, TIA in April 2018  Mild Rhabdomyolysis: Vital signs stable on admission. CK 989. CMP showing normal electrolytes with the exception of mildly low K 3.4. There is evidence of AKI with creatinine elevated 1.79. CBC showing mild leukocytosis to 15.7, hemoglobin normal at 13.8. UA showing no hgb, nitrites, leukocytes but >300 protein. EKG difficult to read but showing multiple things: normal sinus rhythm, left anterior fascicular block, prolonged QTC to 506, and reading as ST elevation as I cannot tell due to poor quality EKG.Troponin 0.06. U/A showing  S/p 2 L normal saline bolus in ED. Rhabdomyolysis likely secondary to prolonged immobilization and dehydration after patient found down at home in her bathroom, naked covered in her own feces and urine. Was likely down for at least 2 days. Patient cannot tell details of fall, likely has some baseline dementia over she is alert and oriented 3. Less likely infectious etiology.  -Admit to family medicine teaching service, telemetry, attending Dr. Jennette Kettle -Vitals per floor protocol -Maintenance IV fluids -We will obtain repeat BMP tomorrow morning -Check CK tomorrow morning -Trend troponins -Repeat EKG -Ins and outs -If kidney function significantly worsens tomorrow may need to consider nephrology consult  Unwitnessed fall at home and down x 2 days: Unclear etiology of fall. CT head and neck showing no acute pathology including no signs of  intracranial bleeding or CVA. No signs of infection with clear UA and no pneumonia on chest x-ray. Fall at home possibly mechanical in etiology vs syncope vs dehydration vs orthostatic hypotension vs worsening dementia . -Telemetry as above  -repeat EKG and troponins as above -Check orthostatic vitals  Elevated Troponin: Likely secondary to rhabdo. Not endorsing any chest pain -repeat EKG and troponins as above  Hx of Sick sinus syndrome with pacemaker:  - Telemetry - EKG  T2DM: Last Hgb A1C 8.8 on 09/18/16.  -Moderate SSI  -CBG qac/hs  Hypertension: Normotensive on admission  -continue home Coreg 12.5 mg twice a day  -holding ramipril due to AKI  Acute on Chronic kidney injury: Patient with a history of CKD 3. Baseline creatinine ~1.1. On admission Creatinine is 1.79. Slight increase likely secondary to decrease po intake. -Daily BMP - Fluids as above -Will continue Sodium Bicarbonate  GERD:Well controlled on home regimen -Will continue pantoprazole 20 mg daily   Depression: Patient with a history of depression and anxiety previously on Sertraline and Aricept. Patient recently move to the area from [place] after loss of husband and decrease independence. PHQ-9 score was 7 on 05/09/2016.  -Will continue Duloxetine 30 mg bid  Cognitive decline: Patient's family reports increasing changes in mood and behaviors with concern for early sign of dementia.  - [place]   Gout: Well controlled with home regimen -Will continue Allopurinol 100 mg daily  Hyperlipidemia -Continue Simvastatin 10 mg daily  FEN/GI: Maintenance IVF, regular diet Prophylaxis: Lovenox  Disposition: Admit to family medicine teaching service, telemetry, attending Dr. Jennette Kettle  History of Present Illness:  Monica Choi is a 83 y.o. female presenting with a fall.  History provided from patient's son-in-law as well as ED physician. Patient not entirely sure why she is in the ED and seems pleasant but slightly  demented.   Patient presented to the ED today after she was found down in her bathroom. Apparently patient lives alone and has dementia. Patient was last seen Sunday night. Her children tried to contact her yesterday and today without any success. She was found today in her bathroom wedged between the toilet and the wall. Apparently patient had been there likely since Sunday when she was last seen. She was found by her social worker who came in to check on her. She was found naked covered in feces and urine. EMS was called and patient was [facility] ED.  Apparently patient's family have been trying to get her into an assisted living facility. The patient lives alone but her children check it on her.   Review Of Systems: Per HPI with the following additions: see HPI  ROS  Patient Active Problem List   Diagnosis Date Noted  . Nonintractable headache   . TIA (transient ischemic attack) 09/17/2016  . Hypertension 07/17/2016  . Sick sinus syndrome (HCC) 07/17/2016    Past Medical History: Past Medical History:  Diagnosis Date  . Anemia   . Anxiety   . Arrhythmia   . Diverticulitis   . GERD (gastroesophageal reflux disease)   . Hypertension   . Rectal prolapse   . Renal disorder   . Sinus node dysfunction (HCC)   . TIA (transient ischemic attack)     Past Surgical History: Past Surgical History:  Procedure Laterality Date  . ABDOMINAL HYSTERECTOMY    . ABDOMINAL SURGERY    . CARDIAC SURGERY    . CATARACT EXTRACTION Bilateral   . CHOLECYSTECTOMY    . COLON SURGERY    . HERNIA REPAIR    . KNEE SURGERY    . NASAL SINUS SURGERY      Social History: Social History  Substance Use Topics  . Smoking status: Former Games developer  . Smokeless tobacco: Never Used  . Alcohol use No   Please also refer to relevant sections of EMR.  Family History: Family History  Problem Relation Age of Onset  . Heart attack Father   . Heart attack Brother     Allergies and  Medications: Allergies  Allergen Reactions  . Morphine Nausea And Vomiting  . Sulfa Antibiotics Anaphylaxis and Rash  . Amoxicillin Other (See Comments)  . Benzonatate Other (See Comments)  . Buprenorphine Nausea And Vomiting  . Fish-Derived Products Nausea Only  . Penicillins Hives    Has patient had a PCN reaction causing immediate rash, facial/tongue/throat swelling, SOB or lightheadedness with hypotension: Yes Has patient had a PCN reaction causing severe rash involving mucus membranes or skin necrosis: Unknown Has patient had a PCN reaction that required hospitalization: Unknown Has patient had a PCN reaction occurring within the last 10 years: No If all of the above answers are "NO", then may proceed with Cephalosporin use.   . Ciprofloxacin Itching and Rash  . Morphine And Related Nausea And Vomiting   No current facility-administered medications on file prior to encounter.    Current Outpatient Prescriptions on File Prior to Encounter  Medication Sig Dispense Refill  . acetaminophen (ACETAMINOPHEN 8 HOUR) 650 MG CR tablet Take 1,300 mg by mouth every 8 (eight) hours as needed for pain.     Marland Kitchen allopurinol (ZYLOPRIM) 100 MG tablet Take 100 mg by mouth daily  as needed (gout).     Marland Kitchen aspirin EC 81 MG EC tablet Take 1 tablet (81 mg total) by mouth daily. 30 tablet 0  . atorvastatin (LIPITOR) 40 MG tablet Take 1 tablet (40 mg total) by mouth daily at 6 PM. 30 tablet 0  . carvedilol (COREG) 12.5 MG tablet Take 1 tablet (12.5 mg total) by mouth 2 (two) times daily. 60 tablet 11  . Cholecalciferol (VITAMIN D) 2000 units CAPS Take 2,000 Units by mouth daily.     . DULoxetine (CYMBALTA) 30 MG capsule Take 30 mg by mouth 2 (two) times daily.    . nitroGLYCERIN (NITROSTAT) 0.4 MG SL tablet Place 0.4 mg under the tongue every 5 (five) minutes as needed for chest pain.    . pantoprazole (PROTONIX) 20 MG tablet Take 20 mg by mouth daily.    . ramipril (ALTACE) 10 MG capsule Take 10 mg by mouth  2 (two) times daily.    . sodium bicarbonate 650 MG tablet Take 650 mg by mouth 2 (two) times daily.     Marland Kitchen loratadine (CLARITIN) 10 MG tablet Take 10 mg by mouth daily as needed for allergies.    Marland Kitchen metoCLOPramide (REGLAN) 10 MG tablet Take 1 tablet (10 mg total) by mouth every 6 (six) hours as needed (for nausea/headache). 12 tablet 0    Objective: BP (!) 157/76   Pulse 91   Temp 97.9 F (36.6 C) (Rectal)   Resp 18   SpO2 96%  Exam: General: In no acute distress, sleeping and snoring initially upon entering the room but easily awoken, alert Eyes: Pupils equal and reactive to light, nonicteric sclera  ENTM: Slightly dry mucous membranes Neck: Supple Cardiovascular:  regular rate and rhythm, normal S1-S2, loud systolic murmur best heard in right second intercostal space parasternally, DP pulses palpable bilaterally Respiratory: normal work of breathing, clear to auscultation bilaterally Gastrointestinal: Soft, nondistended, nontender, normal bowel sounds MSK: moves all extremities spontaneously  Neuro: Alert and oriented 3, cranial nerves II through XII intact, 5 out of 5 strength in bilateral upper and lower extremities, sensation grossly intact throughout [place]; multiple areas of ecchymosis throughout extremities, superficial abrasions on legs and arms Psych: Normal mood and affect  Labs and Imaging: CBC BMET   Recent Labs Lab 11/25/16 1420  WBC 15.7*  HGB 13.8  HCT 42.1  PLT 233    Recent Labs Lab 11/25/16 1420  NA 141  K 3.4*  CL 102  CO2 25  BUN 61*  CREATININE 1.79*  GLUCOSE 216*  CALCIUM 9.5     Dg Chest 1 View  Result Date: 11/25/2016 CLINICAL DATA:  All. EXAM: CHEST 1 VIEW COMPARISON:  Chest x-ray dated October 08, 2016. FINDINGS: Left chest wall AICD in unchanged position with leads terminating in the right atrium and right ventricle. Enlarged cardiomediastinal silhouette, unchanged. Insert mild bibasilar atelectasis. Possible trace left pleural effusion. No  pneumothorax or consolidation. No acute osseous abnormality. IMPRESSION: Unchanged cardiomegaly.  Possible trace left pleural effusion. Electronically Signed   By: Obie Dredge M.D.   On: 11/25/2016 12:00   Dg Pelvis 1-2 Views  Result Date: 11/25/2016 CLINICAL DATA:  Fall. EXAM: PELVIS - 1-2 VIEW COMPARISON:  Pelvic x-rays dated August 02, 2016. FINDINGS: There is no evidence of pelvic fracture or diastasis. No pelvic bone lesions are seen. Osteopenia. Degenerative changes of the lower lumbar spine. Bowel sutures project over the left sacrum. Bilateral buttock injection granulomas. IMPRESSION: No acute fracture or malalignment. If occult hip fracture  is suspected or if the patient is unable to bear weight, MRI is the preferred modality for further evaluation. Electronically Signed   By: Obie Dredge M.D.   On: 11/25/2016 12:03   Ct Head Wo Contrast  Result Date: 11/25/2016 CLINICAL DATA:  Status post fall.  On the ground for 24 hours. EXAM: CT HEAD WITHOUT CONTRAST CT CERVICAL SPINE WITHOUT CONTRAST TECHNIQUE: Multidetector CT imaging of the head and cervical spine was performed following the standard protocol without intravenous contrast. Multiplanar CT image reconstructions of the cervical spine were also generated. COMPARISON:  None. FINDINGS: CT HEAD FINDINGS Brain: No evidence of acute infarction, hemorrhage, extra-axial collection, ventriculomegaly, or mass effect. Generalized cerebral atrophy. Periventricular white matter low attenuation likely secondary to microangiopathy. Vascular: Cerebrovascular atherosclerotic calcifications are noted. Skull: Negative for fracture or focal lesion. Sinuses/Orbits: Visualized portions of the orbits are unremarkable. Visualized portions of the paranasal sinuses and mastoid air cells are unremarkable. Other: None. CT CERVICAL SPINE FINDINGS Alignment: 2 mm anterolisthesis of C4 on C5 with bilateral facet arthropathy. Skull base and vertebrae: No acute fracture.  No primary bone lesion or focal pathologic process. Soft tissues and spinal canal: No prevertebral fluid or swelling. No visible canal hematoma. Disc levels: Severe right facet arthropathy at C2-3. Severe bilateral facet arthropathy at C3-4. Severe bilateral facet arthropathy at C4-5. Severe left facet arthropathy at C5-6. Broad-based disc bulge at C6-7 with severe left facet arthropathy. Severe right facet arthropathy at C7-T1. Upper chest:  Lung apices are clear. Other: No fluid collection or hematoma. Thoracic aortic atherosclerosis. IMPRESSION: 1. No acute intracranial pathology. 2. No acute osseous injury the cervical spine. 3. Cervical spine spondylosis as described above. Electronically Signed   By: Elige Ko   On: 11/25/2016 12:23   Ct Cervical Spine Wo Contrast  Result Date: 11/25/2016 CLINICAL DATA:  Status post fall.  On the ground for 24 hours. EXAM: CT HEAD WITHOUT CONTRAST CT CERVICAL SPINE WITHOUT CONTRAST TECHNIQUE: Multidetector CT imaging of the head and cervical spine was performed following the standard protocol without intravenous contrast. Multiplanar CT image reconstructions of the cervical spine were also generated. COMPARISON:  None. FINDINGS: CT HEAD FINDINGS Brain: No evidence of acute infarction, hemorrhage, extra-axial collection, ventriculomegaly, or mass effect. Generalized cerebral atrophy. Periventricular white matter low attenuation likely secondary to microangiopathy. Vascular: Cerebrovascular atherosclerotic calcifications are noted. Skull: Negative for fracture or focal lesion. Sinuses/Orbits: Visualized portions of the orbits are unremarkable. Visualized portions of the paranasal sinuses and mastoid air cells are unremarkable. Other: None. CT CERVICAL SPINE FINDINGS Alignment: 2 mm anterolisthesis of C4 on C5 with bilateral facet arthropathy. Skull base and vertebrae: No acute fracture. No primary bone lesion or focal pathologic process. Soft tissues and spinal canal: No  prevertebral fluid or swelling. No visible canal hematoma. Disc levels: Severe right facet arthropathy at C2-3. Severe bilateral facet arthropathy at C3-4. Severe bilateral facet arthropathy at C4-5. Severe left facet arthropathy at C5-6. Broad-based disc bulge at C6-7 with severe left facet arthropathy. Severe right facet arthropathy at C7-T1. Upper chest:  Lung apices are clear. Other: No fluid collection or hematoma. Thoracic aortic atherosclerosis. IMPRESSION: 1. No acute intracranial pathology. 2. No acute osseous injury the cervical spine. 3. Cervical spine spondylosis as described above. Electronically Signed   By: Elige Ko   On: 11/25/2016 12:23     Beaulah Dinning, MD 11/25/2016, 4:58 PM PGY-3, [facility] FPTS Intern pager: [phone], text pages welcome

## 2016-11-25 NOTE — ED Provider Notes (Signed)
[facility]-EMERGENCY DEPT Provider Note   CSN: [id number] Arrival date & time: 11/25/16  1035     History   Chief Complaint Chief Complaint  Patient presents with  . Fall    HPI Monica Choi is a 83 y.o. female.  Pt presents to the ED today via EMS due to fall.  Pt has dementia and lives alone.  Family has been trying to get her placed in assisted living.  The pt was last seen on Sunday night.  The pt's social worker came to check on her today, and found her in the bathroom wedged b/t the toilet and the wall.  Pt unable to say how long she's been there.  The pt denies any pain.  She has dried feces on her and smells strongly of urine.  Pt's son-in-law gives more history.  He and his wife moved pt up here from [place] last November.  Since the move, her mental status has deteriorated.  Her family has tried to help her themselves.  She has refused.  They have tried home health.  Pt refuses to answer the door when they come.  She refuses to answer the phone.  She will only let her granddaughters in lately.  They did see her on Sunday.  The family tried to contact her yesterday and today.  She would not answer the phone or answer the door which is not unusual for her.  Today, the [facility] tried to get in.  Again, no answer.  The son-in-law came and opened the door and found patient in the bathroom naked.      Past Medical History:  Diagnosis Date  . Anemia   . Anxiety   . Arrhythmia   . Diverticulitis   . GERD (gastroesophageal reflux disease)   . Hypertension   . Rectal prolapse   . Renal disorder   . Sinus node dysfunction (HCC)   . TIA (transient ischemic attack)     Patient Active Problem List   Diagnosis Date Noted  . Nonintractable headache   . TIA (transient ischemic attack) 09/17/2016  . Hypertension 07/17/2016  . Sick sinus syndrome (HCC) 07/17/2016    Past Surgical History:  Procedure Laterality Date  . ABDOMINAL HYSTERECTOMY    . ABDOMINAL SURGERY    .  CARDIAC SURGERY    . CATARACT EXTRACTION Bilateral   . CHOLECYSTECTOMY    . COLON SURGERY    . HERNIA REPAIR    . KNEE SURGERY    . NASAL SINUS SURGERY      OB History    No data available       Home Medications    Prior to Admission medications   Medication Sig Start Date End Date Taking? Authorizing Provider  acetaminophen (ACETAMINOPHEN 8 HOUR) 650 MG CR tablet Take 1,300 mg by mouth every 8 (eight) hours as needed for pain.    Yes [provider]  allopurinol (ZYLOPRIM) 100 MG tablet Take 100 mg by mouth daily as needed (gout).  06/13/16 06/13/17 Yes [provider]  aspirin EC 81 MG EC tablet Take 1 tablet (81 mg total) by mouth daily. 09/19/16  Yes Riccio, Marylene Land C, DO  atorvastatin (LIPITOR) 40 MG tablet Take 1 tablet (40 mg total) by mouth daily at 6 PM. 09/18/16  Yes Riccio, Angela C, DO  carvedilol (COREG) 12.5 MG tablet Take 1 tablet (12.5 mg total) by mouth 2 (two) times daily. 07/17/16  Yes Camnitz, Andree Coss, MD  Cholecalciferol (VITAMIN D) 2000 units  CAPS Take 2,000 Units by mouth daily.    Yes [provider]  DULoxetine (CYMBALTA) 30 MG capsule Take 30 mg by mouth 2 (two) times daily.   Yes [provider]  nitroGLYCERIN (NITROSTAT) 0.4 MG SL tablet Place 0.4 mg under the tongue every 5 (five) minutes as needed for chest pain.   Yes [provider]  pantoprazole (PROTONIX) 20 MG tablet Take 20 mg by mouth daily. 04/08/16  Yes [provider]  ramipril (ALTACE) 10 MG capsule Take 10 mg by mouth 2 (two) times daily.   Yes [provider]  sodium bicarbonate 650 MG tablet Take 650 mg by mouth 2 (two) times daily.  03/27/16  Yes [provider]  loratadine (CLARITIN) 10 MG tablet Take 10 mg by mouth daily as needed for allergies.    [provider]  metoCLOPramide (REGLAN) 10 MG tablet Take 1 tablet (10 mg total) by mouth every 6 (six) hours as needed (for nausea/headache). 09/16/16   Molpus, John, MD     Family History Family History  Problem Relation Age of Onset  . Heart attack Father   . Heart attack Brother     Social History Social History  Substance Use Topics  . Smoking status: Former Games developer  . Smokeless tobacco: Never Used  . Alcohol use No     Allergies   Morphine; Sulfa antibiotics; Amoxicillin; Benzonatate; Buprenorphine; Fish-derived products; Penicillins; Ciprofloxacin; and Morphine and related   Review of Systems Review of Systems  All other systems reviewed and are negative.    Physical Exam Updated Vital Signs BP (!) 150/64   Pulse 100   Temp 97.9 F (36.6 C) (Rectal)   Resp 17   SpO2 93%   Physical Exam  Constitutional: She appears well-developed and well-nourished.  HENT:  Head: Normocephalic and atraumatic.  Right Ear: External ear normal.  Left Ear: External ear normal.  Nose: Nose normal.  Mouth/Throat: Mucous membranes are dry.  Eyes: Pupils are equal, round, and reactive to light. Conjunctivae and EOM are normal.  Neck: Normal range of motion. Neck supple.  Cardiovascular: Normal rate, regular rhythm, normal heart sounds and intact distal pulses.   Pulmonary/Chest: Effort normal and breath sounds normal.  Abdominal: Soft. Bowel sounds are normal.  Musculoskeletal: Normal range of motion.  Multiple bruises on body in various states of healing.    Neurological: She is alert.  Pt is moving all 4 extremities.  She is following commands.  Skin: Skin is warm.  Nursing note and vitals reviewed.    ED Treatments / Results  Labs (all labs ordered are listed, but only abnormal results are displayed) Labs Reviewed  CBC WITH DIFFERENTIAL/PLATELET - Abnormal; Notable for the following:       Result Value   WBC 16.4 (*)    Hemoglobin 15.5 (*)    Neutro Abs 12.9 (*)    All other components within normal limits  URINALYSIS, ROUTINE W REFLEX MICROSCOPIC - Abnormal; Notable for the following:    Glucose, UA 50 (*)    Protein, ur >=300  (*)    Bacteria, UA RARE (*)    Squamous Epithelial / LPF 0-5 (*)    All other components within normal limits  CK - Abnormal; Notable for the following:    Total CK 989 (*)    All other components within normal limits  CBC WITH DIFFERENTIAL/PLATELET - Abnormal; Notable for the following:    WBC 15.7 (*)    Neutro Abs 12.2 (*)  Monocytes Absolute 1.1 (*)    All other components within normal limits  COMPREHENSIVE METABOLIC PANEL - Abnormal; Notable for the following:    Potassium 3.4 (*)    Glucose, Bld 216 (*)    BUN 61 (*)    Creatinine, Ser 1.79 (*)    AST 61 (*)    Total Bilirubin 1.3 (*)    GFR calc non Af Amer 26 (*)    GFR calc Af Amer 30 (*)    All other components within normal limits  TROPONIN I    EKG  EKG Interpretation  Date/Time:  Tuesday November 25 2016 10:40:24 EDT Ventricular Rate:  96 PR Interval:    QRS Duration: 111 QT Interval:  400 QTC Calculation: 506 R Axis:   -57 Text Interpretation:  Sinus rhythm Left anterior fascicular block Left ventricular hypertrophy Anterior Q waves, possibly due to LVH ST elevation suggests acute pericarditis Prolonged QT interval Confirmed by Jacalyn Lefevre [phone]) on 11/25/2016 10:59:23 AM       Radiology Dg Chest 1 View  Result Date: 11/25/2016 CLINICAL DATA:  All. EXAM: CHEST 1 VIEW COMPARISON:  Chest x-ray dated October 08, 2016. FINDINGS: Left chest wall AICD in unchanged position with leads terminating in the right atrium and right ventricle. Enlarged cardiomediastinal silhouette, unchanged. Insert mild bibasilar atelectasis. Possible trace left pleural effusion. No pneumothorax or consolidation. No acute osseous abnormality. IMPRESSION: Unchanged cardiomegaly.  Possible trace left pleural effusion. Electronically Signed   By: Obie Dredge M.D.   On: 11/25/2016 12:00   Dg Pelvis 1-2 Views  Result Date: 11/25/2016 CLINICAL DATA:  Fall. EXAM: PELVIS - 1-2 VIEW COMPARISON:  Pelvic x-rays dated August 02, 2016.  FINDINGS: There is no evidence of pelvic fracture or diastasis. No pelvic bone lesions are seen. Osteopenia. Degenerative changes of the lower lumbar spine. Bowel sutures project over the left sacrum. Bilateral buttock injection granulomas. IMPRESSION: No acute fracture or malalignment. If occult hip fracture is suspected or if the patient is unable to bear weight, MRI is the preferred modality for further evaluation. Electronically Signed   By: Obie Dredge M.D.   On: 11/25/2016 12:03   Ct Head Wo Contrast  Result Date: 11/25/2016 CLINICAL DATA:  Status post fall.  On the ground for 24 hours. EXAM: CT HEAD WITHOUT CONTRAST CT CERVICAL SPINE WITHOUT CONTRAST TECHNIQUE: Multidetector CT imaging of the head and cervical spine was performed following the standard protocol without intravenous contrast. Multiplanar CT image reconstructions of the cervical spine were also generated. COMPARISON:  None. FINDINGS: CT HEAD FINDINGS Brain: No evidence of acute infarction, hemorrhage, extra-axial collection, ventriculomegaly, or mass effect. Generalized cerebral atrophy. Periventricular white matter low attenuation likely secondary to microangiopathy. Vascular: Cerebrovascular atherosclerotic calcifications are noted. Skull: Negative for fracture or focal lesion. Sinuses/Orbits: Visualized portions of the orbits are unremarkable. Visualized portions of the paranasal sinuses and mastoid air cells are unremarkable. Other: None. CT CERVICAL SPINE FINDINGS Alignment: 2 mm anterolisthesis of C4 on C5 with bilateral facet arthropathy. Skull base and vertebrae: No acute fracture. No primary bone lesion or focal pathologic process. Soft tissues and spinal canal: No prevertebral fluid or swelling. No visible canal hematoma. Disc levels: Severe right facet arthropathy at C2-3. Severe bilateral facet arthropathy at C3-4. Severe bilateral facet arthropathy at C4-5. Severe left facet arthropathy at C5-6. Broad-based disc bulge at  C6-7 with severe left facet arthropathy. Severe right facet arthropathy at C7-T1. Upper chest:  Lung apices are clear. Other: No fluid collection or hematoma. Thoracic  aortic atherosclerosis. IMPRESSION: 1. No acute intracranial pathology. 2. No acute osseous injury the cervical spine. 3. Cervical spine spondylosis as described above. Electronically Signed   By: Elige Ko   On: 11/25/2016 12:23   Ct Cervical Spine Wo Contrast  Result Date: 11/25/2016 CLINICAL DATA:  Status post fall.  On the ground for 24 hours. EXAM: CT HEAD WITHOUT CONTRAST CT CERVICAL SPINE WITHOUT CONTRAST TECHNIQUE: Multidetector CT imaging of the head and cervical spine was performed following the standard protocol without intravenous contrast. Multiplanar CT image reconstructions of the cervical spine were also generated. COMPARISON:  None. FINDINGS: CT HEAD FINDINGS Brain: No evidence of acute infarction, hemorrhage, extra-axial collection, ventriculomegaly, or mass effect. Generalized cerebral atrophy. Periventricular white matter low attenuation likely secondary to microangiopathy. Vascular: Cerebrovascular atherosclerotic calcifications are noted. Skull: Negative for fracture or focal lesion. Sinuses/Orbits: Visualized portions of the orbits are unremarkable. Visualized portions of the paranasal sinuses and mastoid air cells are unremarkable. Other: None. CT CERVICAL SPINE FINDINGS Alignment: 2 mm anterolisthesis of C4 on C5 with bilateral facet arthropathy. Skull base and vertebrae: No acute fracture. No primary bone lesion or focal pathologic process. Soft tissues and spinal canal: No prevertebral fluid or swelling. No visible canal hematoma. Disc levels: Severe right facet arthropathy at C2-3. Severe bilateral facet arthropathy at C3-4. Severe bilateral facet arthropathy at C4-5. Severe left facet arthropathy at C5-6. Broad-based disc bulge at C6-7 with severe left facet arthropathy. Severe right facet arthropathy at C7-T1.  Upper chest:  Lung apices are clear. Other: No fluid collection or hematoma. Thoracic aortic atherosclerosis. IMPRESSION: 1. No acute intracranial pathology. 2. No acute osseous injury the cervical spine. 3. Cervical spine spondylosis as described above. Electronically Signed   By: Elige Ko   On: 11/25/2016 12:23    Procedures Procedures (including critical care time)  Medications Ordered in ED Medications  sodium chloride 0.9 % bolus 1,000 mL (1,000 mLs Intravenous New Bag/Given 11/25/16 1056)  sodium chloride 0.9 % bolus 1,000 mL (1,000 mLs Intravenous New Bag/Given 11/25/16 1432)     Initial Impression / Assessment and Plan / ED Course  I have reviewed the triage vital signs and the nursing notes.  Pertinent labs & imaging results that were available during my care of the patient were reviewed by me and considered in my medical decision making (see chart for details).   Pt was a very difficult IV stick, so I did a right femoral vein stick to get blood.    Pt given IVFs and d/w FP for admission.  Final Clinical Impressions(s) / ED Diagnoses   Final diagnoses:  Fall, initial encounter  Traumatic rhabdomyolysis, initial encounter (HCC)  Dehydration  AKI (acute kidney injury) (HCC)    New Prescriptions New Prescriptions   No medications on file     Jacalyn Lefevre, MD 11/25/16 1609

## 2016-11-26 ENCOUNTER — Encounter (HOSPITAL_COMMUNITY): Payer: Self-pay | Admitting: General Practice

## 2016-11-26 ENCOUNTER — Inpatient Hospital Stay (HOSPITAL_COMMUNITY): Payer: PPO

## 2016-11-26 LAB — TROPONIN I
Troponin I: 0.07 ng/mL (ref <0.03)
Troponin I: 0.06 ng/mL (ref <0.03)

## 2016-11-26 LAB — BASIC METABOLIC PANEL
Anion gap: 10 (ref 5–15)
BUN: 62 mg/dL — AB (ref 6–20)
CALCIUM: 8.6 mg/dL — AB (ref 8.9–10.3)
CO2: 23 mmol/L (ref 22–32)
Chloride: 108 mmol/L (ref 101–111)
Creatinine, Ser: 1.55 mg/dL — ABNORMAL HIGH (ref 0.44–1.00)
GFR calc Af Amer: 35 mL/min — ABNORMAL LOW (ref >60)
GFR, EST NON AFRICAN AMERICAN: 30 mL/min — AB (ref >60)
GLUCOSE: 239 mg/dL — AB (ref 65–99)
POTASSIUM: 3.1 mmol/L — AB (ref 3.5–5.1)
Sodium: 141 mmol/L (ref 135–145)

## 2016-11-26 LAB — CBC
HEMATOCRIT: 38.9 % (ref 36.0–46.0)
Hemoglobin: 12.6 g/dL (ref 12.0–15.0)
MCH: 29.1 pg (ref 26.0–34.0)
MCHC: 32.4 g/dL (ref 30.0–36.0)
MCV: 89.8 fL (ref 78.0–100.0)
Platelets: 208 10*3/uL (ref 150–400)
RBC: 4.33 MIL/uL (ref 3.87–5.11)
RDW: 13 % (ref 11.5–15.5)
WBC: 13.4 10*3/uL — ABNORMAL HIGH (ref 4.0–10.5)

## 2016-11-26 LAB — CK: Total CK: 394 U/L — ABNORMAL HIGH (ref 38–234)

## 2016-11-26 LAB — GLUCOSE, CAPILLARY
GLUCOSE-CAPILLARY: 172 mg/dL — AB (ref 65–99)
GLUCOSE-CAPILLARY: 224 mg/dL — AB (ref 65–99)
Glucose-Capillary: 255 mg/dL — ABNORMAL HIGH (ref 65–99)
Glucose-Capillary: 146 mg/dL — ABNORMAL HIGH (ref 65–99)

## 2016-11-26 MED ORDER — POTASSIUM CHLORIDE CRYS ER 20 MEQ PO TBCR: 20.0000 meq | EXTENDED_RELEASE_TABLET | Freq: Two times a day (BID) | ORAL | Status: DC

## 2016-11-26 MED ORDER — POTASSIUM CHLORIDE CRYS ER 20 MEQ PO TBCR
40.0000 meq | EXTENDED_RELEASE_TABLET | Freq: Two times a day (BID) | ORAL | Status: AC
  Administered 2016-11-26 (×2): 40 meq via ORAL
  Filled 2016-11-26 (×2): qty 2

## 2016-11-26 NOTE — Evaluation (Signed)
Physical Therapy Evaluation Patient Details Name: Monica Choi MRN: [id number] DOB: 10/23/1933 Today's Date: 11/26/2016   History of Present Illness  Pt is an 83 y/o female admitted secondary to sustaining a fall at home. Pt was down for approximately two days. Pt found to have mild rhabdomyolysis and acute on chronic kidney injury. PMH including but not limited to HTN, anxiety, DM, sick sinus syndrome s/p pacemaker placement.  Clinical Impression  Pt presented sitting OOB, awake and willing to participate in therapy session. No family or caregivers present throughout session. Pt reported that she lives alone with family that comes to check on her intermittently. Pt initially stated that she was independent with all mobility and ADLs, but later stated that she uses a RW to ambulate. Pt performed sit<>stand transfers x2 from recliner chair with min A and RW for stability. Pt prematurely sitting back down in chair secondary to reports of pain and fear of falling. Pt would continue to benefit from skilled physical therapy services at this time while admitted and after d/c to address the below listed limitations in order to improve overall safety and independence with functional mobility.      Follow Up Recommendations SNF;Supervision/Assistance - 24 hour    Equipment Recommendations  None recommended by PT    Recommendations for Other Services       Precautions / Restrictions Precautions Precautions: Fall Restrictions Weight Bearing Restrictions: No      Mobility  Bed Mobility               General bed mobility comments: pt OOB in recliner chair upon arrival  Transfers Overall transfer level: Needs assistance Equipment used: Rolling walker (2 wheeled) Transfers: Sit to/from Stand Sit to Stand: Min assist         General transfer comment: increased time, cueing for sequencing and hand placement, min A for stability with transition into standing from recliner. pt performed  x2  Ambulation/Gait             General Gait Details: attempted; however, pt prematurely sitting back down in chair with c/o pain and fear of falling  Stairs            Wheelchair Mobility    Modified Rankin (Stroke Patients Only)       Balance Overall balance assessment: Needs assistance Sitting-balance support: Feet supported;Bilateral upper extremity supported Sitting balance-Leahy Scale: Poor Sitting balance - Comments: pt unable to sit upright without back support without either min A and/or bilateral UE supports Postural control: Posterior lean Standing balance support: During functional activity;Bilateral upper extremity supported Standing balance-Leahy Scale: Poor Standing balance comment: pt reliant on external supports                             Pertinent Vitals/Pain Pain Assessment: Faces Faces Pain Scale: Hurts whole lot Pain Location: stomach, R trunk Pain Descriptors / Indicators: Sore;Grimacing;Guarding;Moaning Pain Intervention(s): Monitored during session;Repositioned;Premedicated before session    Home Living Family/patient expects to be discharged to:: Private residence Living Arrangements: Alone Available Help at Discharge: Family;Available PRN/intermittently Type of Home: House Home Access: Level entry     Home Layout: One level Home Equipment: Walker - 2 wheels;Cane - single point;Shower seat;Grab bars - tub/shower      Prior Function Level of Independence: Independent with assistive device(s)         Comments: pt initially reporting that she ambulates independently; then when asked at end of session, pt  reported that she uses a RW     Hand Dominance        Extremity/Trunk Assessment   Upper Extremity Assessment Upper Extremity Assessment: Defer to OT evaluation    Lower Extremity Assessment Lower Extremity Assessment: Generalized weakness    Cervical / Trunk Assessment Cervical / Trunk Assessment:  Kyphotic  Communication   Communication: Expressive difficulties  Cognition Arousal/Alertness: Awake/alert Behavior During Therapy: WFL for tasks assessed/performed Overall Cognitive Status: History of cognitive impairments - at baseline                                 General Comments: pt with ?dementia at baseline and per chart review family is concerned that her cognition is worsening      General Comments      Exercises     Assessment/Plan    PT Assessment Patient needs continued PT services  PT Problem List Decreased strength;Decreased activity tolerance;Decreased balance;Decreased mobility;Decreased coordination;Decreased safety awareness;Decreased knowledge of use of DME;Decreased cognition;Pain       PT Treatment Interventions DME instruction;Gait training;Stair training;Functional mobility training;Therapeutic activities;Therapeutic exercise;Balance training;Cognitive remediation;Neuromuscular re-education;Patient/family education    PT Goals (Current goals can be found in the Care Plan section)  Acute Rehab PT Goals Patient Stated Goal: decrease pain PT Goal Formulation: With patient Time For Goal Achievement: 12/10/16 Potential to Achieve Goals: Good    Frequency Min 3X/week   Barriers to discharge Decreased caregiver support      Co-evaluation               AM-PAC PT "6 Clicks" Daily Activity  Outcome Measure Difficulty turning over in bed (including adjusting bedclothes, sheets and blankets)?: A Lot Difficulty moving from lying on back to sitting on the side of the bed? : A Lot Difficulty sitting down on and standing up from a chair with arms (e.g., wheelchair, bedside commode, etc,.)?: Unable Help needed moving to and from a bed to chair (including a wheelchair)?: A Lot Help needed walking in hospital room?: A Lot Help needed climbing 3-5 steps with a railing? : Total 6 Click Score: 10    End of Session Equipment Utilized During  Treatment: Gait belt Activity Tolerance: Patient limited by pain Patient left: in chair;with call bell/phone within reach;with chair alarm set;Other (comment) (OT in room) Nurse Communication: Mobility status PT Visit Diagnosis: Other abnormalities of gait and mobility (R26.89);History of falling (Z91.81);Pain Pain - Right/Left: Right Pain - part of body:  (trunk and abdomen)    Time: 4967-5916 PT Time Calculation (min) (ACUTE ONLY): 31 min   Charges:   PT Evaluation $PT Eval Moderate Complexity: 1 Mod PT Treatments $Therapeutic Activity: 8-22 mins   PT G Codes:        [place], PT, DPT [phone]   Alessandra Bevels Yashas Camilli 11/26/2016, 10:54 AM

## 2016-11-26 NOTE — Plan of Care (Signed)
Problem: Safety: Goal: Ability to remain free from injury will improve Outcome: Progressing No fall and injury this shift  Problem: Pain Managment: Goal: General experience of comfort will improve Outcome: Progressing Denies needs for pain medication  Problem: Skin Integrity: Goal: Risk for impaired skin integrity will decrease Outcome: Not Progressing Multiple scatted old bruises and abrasions in legs and arms, bruises to hips  Problem: Activity: Goal: Risk for activity intolerance will decrease Outcome: Progressing Remains in the bed tonight  Problem: Bowel/Gastric: Goal: Will not experience complications related to bowel motility Outcome: Progressing No gastric issues noted,

## 2016-11-26 NOTE — Evaluation (Signed)
Occupational Therapy Evaluation Patient Details Name: Monica Choi MRN: [id number] DOB: [birth year] Today's Date: 11/26/2016    History of Present Illness Pt is an 83 y/o female admitted secondary to sustaining a fall at home. Pt was down for approximately two days. Pt found to have mild rhabdomyolysis and acute on chronic kidney injury. PMH including but not limited to HTN, anxiety, DM, sick sinus syndrome s/p pacemaker placement.   Clinical Impression   PTA Pt independent in ADL and used a RW for mobility. Pt is currently min A for seated ADL and max A for LB ADL primarily due to pain. Pt requiring assistance with problem solving for common ADL this session and decreased awareness during session. Pt is hard to hear as she speaks very softly. Pt will benefit from skilled OT in the acute setting and afterwards at the SNF level to maximize safety and independence in ADL and functional transfers. Pt would benefit from a cognitive eval. Attempted to do MOCA this session, but Pt unable to see test without glasses. Will need to find alternative. Pt would benefit from a SLP language/cognitive eval at this time.     Follow Up Recommendations  SNF;Supervision/Assistance - 24 hour    Equipment Recommendations  None recommended by OT (Pt has appropriate DME)    Recommendations for Other Services Speech consult     Precautions / Restrictions Precautions Precautions: Fall Restrictions Weight Bearing Restrictions: No      Mobility Bed Mobility               General bed mobility comments: pt OOB in recliner chair upon arrival  Transfers Overall transfer level: Needs assistance Equipment used: Rolling walker (2 wheeled) Transfers: Sit to/from Stand Sit to Stand: Min assist         General transfer comment: increased time, cueing for sequencing and hand placement, min A for stability with transition into standing from recliner. pt performed x2    Balance Overall balance assessment:  Needs assistance Sitting-balance support: Feet supported;Bilateral upper extremity supported Sitting balance-Leahy Scale: Poor Sitting balance - Comments: pt unable to sit upright without back support without either min A and/or bilateral UE supports Postural control: Posterior lean Standing balance support: During functional activity;Bilateral upper extremity supported Standing balance-Leahy Scale: Poor Standing balance comment: pt reliant on external supports                           ADL either performed or assessed with clinical judgement   ADL Overall ADL's : Needs assistance/impaired Eating/Feeding: Minimal assistance;Sitting Eating/Feeding Details (indicate cue type and reason): assist due to pain in (abdomen?) trunk - able to hold onto utensil with LUE Grooming: Wash/dry hands;Wash/dry face;Oral care;Minimal assistance;Sitting Grooming Details (indicate cue type and reason): able to perform oral care with min A for sequencing (performed in sitting in recliner) and problem solving (how to open toothbrush, to spit in cup, and to clean up toothpaste that was all over her face) Upper Body Bathing: Minimal assistance   Lower Body Bathing: Maximal assistance   Upper Body Dressing : Moderate assistance   Lower Body Dressing: Maximal assistance   Toilet Transfer: Minimal assistance;+2 for safety/equipment;Stand-pivot;BSC;RW Toilet Transfer Details (indicate cue type and reason): simulated with recliner, Pt min A to stand with RW - but will suddenly "plop" back down on chair - did not sustain standing for more than 5 seconds. Toileting- Clothing Manipulation and Hygiene: Moderate assistance       Functional  mobility during ADLs: Minimal assistance;+2 for safety/equipment;Rolling walker General ADL Comments: Pt unreliable historian at this point for prior levels of independence     Vision Baseline Vision/History: Wears glasses Wears Glasses: At all times Patient Visual  Report: Other (comment);No change from baseline (no glasses present at this time)       Perception     Praxis      Pertinent Vitals/Pain Pain Assessment: Faces Faces Pain Scale: Hurts whole lot Pain Location: stomach, R trunk Pain Descriptors / Indicators: Sore;Grimacing;Guarding;Moaning Pain Intervention(s): Monitored during session;Repositioned     Hand Dominance Left   Extremity/Trunk Assessment Upper Extremity Assessment Upper Extremity Assessment: Generalized weakness (no noted difference between L and R at this time)   Lower Extremity Assessment Lower Extremity Assessment: Defer to PT evaluation   Cervical / Trunk Assessment Cervical / Trunk Assessment: Kyphotic   Communication Communication Communication: Expressive difficulties (very soft spoken)   Cognition Arousal/Alertness: Awake/alert Behavior During Therapy: WFL for tasks assessed/performed Overall Cognitive Status: No family/caregiver present to determine baseline cognitive functioning Area of Impairment: Attention;Following commands;Problem solving;Awareness;Safety/judgement                   Current Attention Level: Selective   Following Commands: Follows one step commands with increased time Safety/Judgement: Decreased awareness of deficits Awareness: Emergent Problem Solving: Slow processing;Decreased initiation;Difficulty sequencing;Requires verbal cues General Comments: Unable to perform MOCA this session due to Pt unable to see the test (normally has glasses but has lost them)   General Comments  Attempted to perform regular MOCA test, and unable since Pt could not see the test without glasses.    Exercises     Shoulder Instructions      Home Living Family/patient expects to be discharged to:: Unsure Living Arrangements: Alone Available Help at Discharge: Family;Available PRN/intermittently Type of Home: House Home Access: Level entry     Home Layout: One level     Bathroom  Shower/Tub: Tub/shower unit;Walk-in shower   Bathroom Toilet: Handicapped height     Home Equipment: Environmental consultant - 2 wheels;Cane - single point;Shower seat;Grab bars - tub/shower   Additional Comments: inconsistent reports during session on home information from Pt      Prior Functioning/Environment Level of Independence: Independent with assistive device(s)        Comments: No family to confirm PTA independence levels        OT Problem List: Decreased strength;Decreased range of motion;Decreased activity tolerance;Impaired balance (sitting and/or standing);Decreased cognition;Decreased safety awareness;Decreased knowledge of use of DME or AE;Pain      OT Treatment/Interventions: Self-care/ADL training;Therapeutic exercise;Energy conservation;DME and/or AE instruction;Therapeutic activities;Cognitive remediation/compensation;Patient/family education;Balance training    OT Goals(Current goals can be found in the care plan section) Acute Rehab OT Goals Patient Stated Goal: decrease pain OT Goal Formulation: With patient Time For Goal Achievement: 12/10/16 Potential to Achieve Goals: Good ADL Goals Pt Will Perform Eating: with modified independence;sitting Pt Will Perform Grooming: with modified independence;sitting Pt Will Perform Upper Body Bathing: with modified independence;sitting;with adaptive equipment Pt Will Perform Lower Body Bathing: with adaptive equipment;sitting/lateral leans;with supervision;with caregiver independent in assisting Pt Will Transfer to Toilet: with supervision;stand pivot transfer;bedside commode (with RW; caregiver independent in assisting) Pt Will Perform Toileting - Clothing Manipulation and hygiene: with modified independence;sit to/from stand;with caregiver independent in assisting Additional ADL Goal #1: Pt will recall 3 ways of conserving energy during ADL with one or less verbal cues  OT Frequency: Min 2X/week   Barriers to D/C: Decreased  caregiver support  from notes glean that family is near by but cannot provide 24 hour supervision       Co-evaluation              AM-PAC PT "6 Clicks" Daily Activity     Outcome Measure Help from another person eating meals?: A Little Help from another person taking care of personal grooming?: A Little Help from another person toileting, which includes using toliet, bedpan, or urinal?: A Lot Help from another person bathing (including washing, rinsing, drying)?: A Lot Help from another person to put on and taking off regular upper body clothing?: A Little Help from another person to put on and taking off regular lower body clothing?: A Lot 6 Click Score: 15   End of Session Equipment Utilized During Treatment: Rolling walker;Gait belt;Oxygen Nurse Communication: Mobility status  Activity Tolerance: Patient limited by fatigue;Patient limited by pain Patient left: in chair;with chair alarm set;with call bell/phone within reach;with nursing/sitter in room  OT Visit Diagnosis: Unsteadiness on feet (R26.81);Other abnormalities of gait and mobility (R26.89);Muscle weakness (generalized) (M62.81);Other symptoms and signs involving cognitive function;Pain Pain - Right/Left: Right Pain - part of body:  (abdomen)                Time: 0762-2633 OT Time Calculation (min): 18 min Charges:  OT General Charges $OT Visit: 1 Procedure OT Evaluation $OT Eval Moderate Complexity: 1 Procedure G-Codes:     Sherryl Manges OTR/L [id number] Evern Bio Fontaine Hehl 11/26/2016, 11:29 AM

## 2016-11-26 NOTE — Progress Notes (Addendum)
CALL PAGER [phone] for any questions or notifications regarding this patient  FMTS Attending Note: Denny Levy MD I spoke with her outpatient osychaitrist, DR PLOVSKY, who relayed some information to [place]. I assured him we had consulted PSYCHIATRY inpatient and he asked that we pass along his phone number to the psychiatrist so they could confer: Dr. Donell Beers [phone]

## 2016-11-26 NOTE — Progress Notes (Signed)
Family Medicine Teaching Service Daily Progress Note Intern Pager: [phone]  Patient name: Monica Choi Medical record number: [id number] Date of birth: 1934/01/03 Age: 83 y.o. Gender: female  Primary Care Provider: Macy Mis, MD Consultants: None     Code Status: Full (per discussion admission)   Chief Complaint: Fall  Assessment and Plan: Monica Choi is a 83 y.o. female presenting with a fall . PMH is significant for anemia, anxiety, GERD, hypertension, Sick Sinus Syndrome w/ pacemaker, TIA in April 2018  Mild Rhabdomyolysis: Vital signs stable overnight. CK down to 394 from 989. CMP showing hypocalcemia 8.6 and hypokalemia 3.1. There is evidence of recovering AKI with creatinine lowering to 1.55 from 1.79. CBC showing mild leukocytosis to 13.4, hemoglobin normal at 13.8. UA showing no hgb, nitrites, leukocytes but >300 protein. Initial EKG difficult to read but showing multiple things: normal sinus rhythm, left anterior fascicular block, prolonged QTC to 506, and reading as ST elevation as I cannot tell due to poor quality EKG.  Followup ECG showed LVH causing some anterior st elevation, bigeminy, and resolved QT prolongation.  Troponin trend 0.06>0.1>0.07. Rhabdomyolysis likely secondary to prolonged immobilization and dehydration after patient found down at home in her bathroom, naked covered in her own feces and urine. Was likely down for at least 2 days. Patient cannot tell details of fall, likely has some baseline dementia over she is alert and oriented 3. Less likely infectious etiology.  -Admit to family medicine teaching service, telemetry, attending Dr. Jennette Kettle -Vitals per floor protocol -Maintenance IV fluids -We will address electrolytes and obtain repeat BMP tomorrow morning -Check CK tomorrow morning -will discuss further troponin testing -Ins and outs -kidney function appears to be recovering but will discuss need to consider nephrology consult  Unwitnessed fall at  home and down x 2 days: Unclear etiology of fall. CT head and neck showing no acute pathology including no signs of intracranial bleeding or CVA. No signs of infection with clear UA and no pneumonia on chest x-ray. Fall at home possibly mechanical in etiology vs syncope vs dehydration vs orthostatic hypotension vs worsening dementia . -Telemetry as above  -repeat EKG and troponins as above -Check orthostatic vitals  Elevated Troponin: Likely secondary to rhabdo. Not endorsing any chest pain -repeat EKG and troponins as above  Hx of Sick sinus syndrome with pacemaker:  - Telemetry - EKG  T2DM: Last Hgb A1C 8.8 on 09/18/16.  -Moderate SSI  -CBG qac/hs -will calculate for lantus tonight  Hypertension: Normotensive on admission  -continue home Coreg 12.5 mg twice a day  -holding ramipril due to AKI  Acute on Chronic kidney injury: Patient with a history of CKD 3. Baseline creatinine ~1.1. On admission Creatinine is 1.79 but recovered to 1.55. Slight increase likely secondary to decrease po intake. -Daily BMP -Fluids as above -Will continue Sodium Bicarbonate  Right sided rib pain- likely MSK given specific response on deep inspiration or palpation -rib series? -discuss pain management  Hypokalemia- 3.1 -discuss kdur 20mg  x2  Hypocalcemia -will recheck tommorow  Depression: Patient with a history of depression and anxiety previously on Sertraline and Aricept. Patient recently move to the area from [place] after loss of husband and decrease independence. PHQ-9 score was 7 on 05/09/2016.  -Will continue Duloxetine 30 mg bid  Cognitive decline: Patient's family reports increasing changes in mood and behaviors with concern for early sign of dementia.  - Moca testing  Gout: Well controlled with home regimen -Will continue Allopurinol 100 mg daily  Hyperlipidemia -  Continue Simvastatin 10 mg daily  GERD:Well controlled on home regimen -Will continue pantoprazole 20 mg  daily   FEN/GI: Maintenance IVF, regular diet Prophylaxis: Lovenox  Disposition: Admit to family medicine teaching service, telemetry, attending Dr. Jennette Kettle  Subjective:  Patient was hurting in lower right ribs, but otherwise well.  Confused about details of her arrival and past but is AOx3.  Objective: Temp:  [97.7 F (36.5 C)-97.9 F (36.6 C)] 97.7 F (36.5 C) (08/22 0456) Pulse Rate:  [83-108] 85 (08/22 0456) Resp:  [13-24] 16 (08/22 0456) BP: (105-188)/(56-96) 142/60 (08/22 0456) SpO2:  [87 %-98 %] 98 % (08/22 0456) Weight:  [136 lb 8 oz (61.9 kg)-138 lb 4.8 oz (62.7 kg)] 136 lb 8 oz (61.9 kg) (08/22 0204) Physical Exam: General: In no acute distress, alert Eyes: EEOMI, nonicteric sclera  ENTM: Slightly dry mucous membranes Neck: Supple Cardiovascular:  regular rate and rhythm, normal S1-S2, systolic murmur, DP pulses palpable bilaterally, significant orthostatic hypotension Respiratory: normal work of breathing, clear to auscultation bilaterally, could not deeply inhale without significant pain to lower right anterior ribs Gastrointestinal: Soft, nondistended, nontender, normal bowel sounds MSK: moves all extremities spontaneously  Neuro: Alert and oriented 3, no deficits noted, sensation grossly intact throughout, multiple areas of ecchymosis throughout extremities, superficial abrasions on legs and arms Psych: Normal mood and affect, was not able to remember if anyone lived with her, did not remember how she got here, was concerned about living alone and agreed to placement although capacity assesment would need to be completed  Laboratory:  Recent Labs Lab 11/25/16 1300 11/25/16 1420 11/26/16 0228  WBC 16.4* 15.7* 13.4*  HGB 15.5* 13.8 12.6  HCT 45.0 42.1 38.9  PLT 184 233 208    Recent Labs Lab 11/25/16 1420 11/26/16 0228  NA 141 141  K 3.4* 3.1*  CL 102 108  CO2 25 23  BUN 61* 62*  CREATININE 1.79* 1.55*  CALCIUM 9.5 8.6*  PROT 6.6  --   BILITOT 1.3*   --   ALKPHOS 79  --   ALT 43  --   AST 61*  --   GLUCOSE 216* 239*      Imaging/Diagnostic Tests: Dg Chest 1 View  Result Date: 11/25/2016 CLINICAL DATA:  All. EXAM: CHEST 1 VIEW COMPARISON:  Chest x-ray dated October 08, 2016. FINDINGS: Left chest wall AICD in unchanged position with leads terminating in the right atrium and right ventricle. Enlarged cardiomediastinal silhouette, unchanged. Insert mild bibasilar atelectasis. Possible trace left pleural effusion. No pneumothorax or consolidation. No acute osseous abnormality. IMPRESSION: Unchanged cardiomegaly.  Possible trace left pleural effusion. Electronically Signed   By: Obie Dredge M.D.   On: 11/25/2016 12:00   Dg Pelvis 1-2 Views  Result Date: 11/25/2016 CLINICAL DATA:  Fall. EXAM: PELVIS - 1-2 VIEW COMPARISON:  Pelvic x-rays dated August 02, 2016. FINDINGS: There is no evidence of pelvic fracture or diastasis. No pelvic bone lesions are seen. Osteopenia. Degenerative changes of the lower lumbar spine. Bowel sutures project over the left sacrum. Bilateral buttock injection granulomas. IMPRESSION: No acute fracture or malalignment. If occult hip fracture is suspected or if the patient is unable to bear weight, MRI is the preferred modality for further evaluation. Electronically Signed   By: Obie Dredge M.D.   On: 11/25/2016 12:03   Ct Head Wo Contrast  Result Date: 11/25/2016 CLINICAL DATA:  Status post fall.  On the ground for 24 hours. EXAM: CT HEAD WITHOUT CONTRAST CT CERVICAL SPINE WITHOUT CONTRAST TECHNIQUE:  Multidetector CT imaging of the head and cervical spine was performed following the standard protocol without intravenous contrast. Multiplanar CT image reconstructions of the cervical spine were also generated. COMPARISON:  None. FINDINGS: CT HEAD FINDINGS Brain: No evidence of acute infarction, hemorrhage, extra-axial collection, ventriculomegaly, or mass effect. Generalized cerebral atrophy. Periventricular white matter low  attenuation likely secondary to microangiopathy. Vascular: Cerebrovascular atherosclerotic calcifications are noted. Skull: Negative for fracture or focal lesion. Sinuses/Orbits: Visualized portions of the orbits are unremarkable. Visualized portions of the paranasal sinuses and mastoid air cells are unremarkable. Other: None. CT CERVICAL SPINE FINDINGS Alignment: 2 mm anterolisthesis of C4 on C5 with bilateral facet arthropathy. Skull base and vertebrae: No acute fracture. No primary bone lesion or focal pathologic process. Soft tissues and spinal canal: No prevertebral fluid or swelling. No visible canal hematoma. Disc levels: Severe right facet arthropathy at C2-3. Severe bilateral facet arthropathy at C3-4. Severe bilateral facet arthropathy at C4-5. Severe left facet arthropathy at C5-6. Broad-based disc bulge at C6-7 with severe left facet arthropathy. Severe right facet arthropathy at C7-T1. Upper chest:  Lung apices are clear. Other: No fluid collection or hematoma. Thoracic aortic atherosclerosis. IMPRESSION: 1. No acute intracranial pathology. 2. No acute osseous injury the cervical spine. 3. Cervical spine spondylosis as described above. Electronically Signed   By: Elige Ko   On: 11/25/2016 12:23   Ct Cervical Spine Wo Contrast  Result Date: 11/25/2016 CLINICAL DATA:  Status post fall.  On the ground for 24 hours. EXAM: CT HEAD WITHOUT CONTRAST CT CERVICAL SPINE WITHOUT CONTRAST TECHNIQUE: Multidetector CT imaging of the head and cervical spine was performed following the standard protocol without intravenous contrast. Multiplanar CT image reconstructions of the cervical spine were also generated. COMPARISON:  None. FINDINGS: CT HEAD FINDINGS Brain: No evidence of acute infarction, hemorrhage, extra-axial collection, ventriculomegaly, or mass effect. Generalized cerebral atrophy. Periventricular white matter low attenuation likely secondary to microangiopathy. Vascular: Cerebrovascular  atherosclerotic calcifications are noted. Skull: Negative for fracture or focal lesion. Sinuses/Orbits: Visualized portions of the orbits are unremarkable. Visualized portions of the paranasal sinuses and mastoid air cells are unremarkable. Other: None. CT CERVICAL SPINE FINDINGS Alignment: 2 mm anterolisthesis of C4 on C5 with bilateral facet arthropathy. Skull base and vertebrae: No acute fracture. No primary bone lesion or focal pathologic process. Soft tissues and spinal canal: No prevertebral fluid or swelling. No visible canal hematoma. Disc levels: Severe right facet arthropathy at C2-3. Severe bilateral facet arthropathy at C3-4. Severe bilateral facet arthropathy at C4-5. Severe left facet arthropathy at C5-6. Broad-based disc bulge at C6-7 with severe left facet arthropathy. Severe right facet arthropathy at C7-T1. Upper chest:  Lung apices are clear. Other: No fluid collection or hematoma. Thoracic aortic atherosclerosis. IMPRESSION: 1. No acute intracranial pathology. 2. No acute osseous injury the cervical spine. 3. Cervical spine spondylosis as described above. Electronically Signed   By: Elige Ko   On: 11/25/2016 12:23     Marthenia Rolling, DO 11/26/2016, 7:14 AM PGY-1, [facility] Family Medicine FPTS Intern pager: [phone], text pages welcome

## 2016-11-27 DIAGNOSIS — R51 Headache: Secondary | ICD-10-CM

## 2016-11-27 DIAGNOSIS — Z950 Presence of cardiac pacemaker: Secondary | ICD-10-CM

## 2016-11-27 DIAGNOSIS — M6282 Rhabdomyolysis: Secondary | ICD-10-CM

## 2016-11-27 DIAGNOSIS — F09 Unspecified mental disorder due to known physiological condition: Secondary | ICD-10-CM

## 2016-11-27 DIAGNOSIS — Z87891 Personal history of nicotine dependence: Secondary | ICD-10-CM

## 2016-11-27 DIAGNOSIS — F329 Major depressive disorder, single episode, unspecified: Secondary | ICD-10-CM

## 2016-11-27 LAB — COMPREHENSIVE METABOLIC PANEL
ALBUMIN: 2.7 g/dL — AB (ref 3.5–5.0)
ALK PHOS: 63 U/L (ref 38–126)
ALT: 32 U/L (ref 14–54)
ANION GAP: 6 (ref 5–15)
AST: 33 U/L (ref 15–41)
BILIRUBIN TOTAL: 1 mg/dL (ref 0.3–1.2)
BUN: 57 mg/dL — AB (ref 6–20)
CALCIUM: 8.3 mg/dL — AB (ref 8.9–10.3)
CO2: 23 mmol/L (ref 22–32)
CREATININE: 1.49 mg/dL — AB (ref 0.44–1.00)
Chloride: 111 mmol/L (ref 101–111)
GFR calc Af Amer: 37 mL/min — ABNORMAL LOW (ref >60)
GFR calc non Af Amer: 32 mL/min — ABNORMAL LOW (ref >60)
GLUCOSE: 148 mg/dL — AB (ref 65–99)
Potassium: 3.7 mmol/L (ref 3.5–5.1)
SODIUM: 140 mmol/L (ref 135–145)
TOTAL PROTEIN: 5.2 g/dL — AB (ref 6.5–8.1)

## 2016-11-27 LAB — GLUCOSE, CAPILLARY
GLUCOSE-CAPILLARY: 137 mg/dL — AB (ref 65–99)
GLUCOSE-CAPILLARY: 284 mg/dL — AB (ref 65–99)
GLUCOSE-CAPILLARY: 96 mg/dL (ref 65–99)
Glucose-Capillary: 160 mg/dL — ABNORMAL HIGH (ref 65–99)

## 2016-11-27 LAB — CBC
HEMATOCRIT: 34.1 % — AB (ref 36.0–46.0)
HEMOGLOBIN: 11 g/dL — AB (ref 12.0–15.0)
MCH: 29.3 pg (ref 26.0–34.0)
MCHC: 32.3 g/dL (ref 30.0–36.0)
MCV: 90.7 fL (ref 78.0–100.0)
Platelets: 169 10*3/uL (ref 150–400)
RBC: 3.76 MIL/uL — AB (ref 3.87–5.11)
RDW: 13 % (ref 11.5–15.5)
WBC: 9.3 10*3/uL (ref 4.0–10.5)

## 2016-11-27 LAB — CK: CK TOTAL: 292 U/L — AB (ref 38–234)

## 2016-11-27 MED ORDER — TRAMADOL HCL 50 MG PO TABS
50.0000 mg | ORAL_TABLET | Freq: Two times a day (BID) | ORAL | Status: DC | PRN
  Administered 2016-11-28: 50 mg via ORAL
  Filled 2016-11-27 (×2): qty 1

## 2016-11-27 MED ORDER — WHITE PETROLATUM GEL
Status: AC
  Filled 2016-11-27: qty 1

## 2016-11-27 NOTE — Clinical Social Work Note (Signed)
Clinical Social Work Assessment  Patient Details  Name: Monica Choi MRN: [id number] Date of Birth: 15-Jan-1934  Date of referral:  11/27/16               Reason for consult:  Facility Placement                Permission sought to share information with:  Facility Medical sales representative, Family Supports Permission granted to share information::  Yes, Verbal Permission Granted  Name::     Therapist, nutritional::  SNFs  Relationship::  Son-in-law  Contact Information:  [phone]  Housing/Transportation Living arrangements for the past 2 months:  Single Family Home Source of Information:  Patient, Adult Children, Other (Comment Required) Patient Interpreter Needed:  None Criminal Activity/Legal Involvement Pertinent to Current Situation/Hospitalization:  No - Comment as needed Significant Relationships:  Adult Children, Other Family Members Lives with:  Self Do you feel safe going back to the place where you live?  No Need for family participation in patient care:  Yes (Comment)  Care giving concerns:  CSW received consult for possible SNF placement at time of discharge. CSW spoke with patient and her daughter and son-in-law, Monica Choi, at bedside regarding PT recommendation of SNF placement at time of discharge. Patient reported that given patient's current physical needs and fall risk she is afraid to go home alone. Patient expressed understanding of PT recommendation and is agreeable to SNF placement at time of discharge. CSW to continue to follow and assist with discharge planning needs.   Social Worker assessment / plan:  CSW spoke with patient concerning possibility of rehab at [facility] before returning home.  Employment status:  Retired Network engineer Care PT Recommendations:  Skilled Nursing Facility Information / Referral to community resources:  Skilled Nursing Facility  Patient/Family's Response to care:  Patient recognizes need for rehab before returning home and is  agreeable to a SNF in [place]. Patient's son-in-law reported preference for [facility] or somewhere nice. He used to be a Company secretary and has been to a lot of the facilities. He described all the difficulties patient has been having and how she goes periods of time without contacting family or answering the phone/door for home health. Sometimes she stays in the bed for a few days at a time. Patient has become paranoid and thinks family is stealing food from her. She just moved here from [place] in 02-25-23 after her husband died. Her other son had POA in [place], but Monica Choi is checking with his lawyers to see if that holds up here in [place]. Monica Choi and patient's daughter are willing to help with paperwork at [facility]. They are also looking into ALF's for patient after SNF because she does not feel safe by herself.   Patient/Family's Understanding of and Emotional Response to Diagnosis, Current Treatment, and Prognosis:  Patient/family is realistic regarding therapy needs and expressed being hopeful for SNF placement. Patient expressed understanding of CSW role and discharge process as well as medical condition. No questions/concerns about plan or treatment.    Emotional Assessment Appearance:  Appears stated age Attitude/Demeanor/Rapport:  Other (Appropriate) Affect (typically observed):  Accepting, Appropriate Orientation:  Oriented to Self, Oriented to Place, Oriented to  Time, Oriented to Situation Alcohol / Substance use:  Not Applicable Psych involvement (Current and /or in the community):  No (Comment)  Discharge Needs  Concerns to be addressed:  Care Coordination Readmission within the last 30 days:  No Current discharge risk:  None Barriers to Discharge:  Continued Medical Work up   [employer], LCSWA 11/27/2016, 10:47 AM

## 2016-11-27 NOTE — Consult Note (Signed)
   [facility] Inpatient Consult   11/27/2016  Monica Choi 02-02-1934 [id number]  Patient screened for potential [facility] services. Patient is in [employer] plan. Met with the patient at the bedside. Patient alert and oriented but states she is having difficulty with her memory.  Chart review reveals patient to go to a skilled facility for rehab.  No family in the room.  Left a brochure with 24 hour nurse advise line and contact information.    Please place a [facility] Care Management consult or for questions contact:   Natividad Brood, RN BSN [facility] Liaison  [phone] business mobile phone Toll free office [phone]

## 2016-11-27 NOTE — Consult Note (Signed)
[facility] Consult   Reason for Consult:  capacity Referring Physician:  Dr. Nori Riis Patient Identification: Monica Choi MRN:  [id number] Principal Diagnosis: <principal problem not specified> Diagnosis:   Patient Active Problem List   Diagnosis Date Noted  . Rhabdomyolysis [M62.82] 11/25/2016  . Nonintractable headache [R51]   . TIA (transient ischemic attack) [G45.9] 09/17/2016  . Hypertension [I10] 07/17/2016  . Sick sinus syndrome ([place]) [I49.5] 07/17/2016    Total Time spent with patient: 1 hour  Subjective:   Monica Choi is a 83 y.o. female patient admitted with recent fall.  HPI:  Monica Choi is a 83 y.o. female, seen, chart reviewed and case discussed with her daughter and granddaughter who is at bedside for this face-to-face psychiatric consultation and evaluation of capacity to make her own medical decisions and living arrangements. Patient has been suffering with multiple medical problems including sick sinus syndrome with pacemaker, TIA reportedly lost consciousness and fell in her bath room on unable to call for help for some time which resulted rhabdomyolysis. Patient reportedly has bruises and broken ribs. During my evaluation patient is awake, alert, oriented to time place person and situation. Patient knows of first name, last name, date of birth, current date, month, year and season. Patient also knows he she is in hospital for recent fall. Patient reportedly relocated from [place] to [place] about a year ago. Patient has intact memory but required a few times need to repeat 3 objects register and delayed recall was 2 out of 3. Patient has intact concentration able to spell the  "WORLD" with little difficulty. Patient has intact language functions and able to interpret proverbs appropriately which indicated she has obstructing thinking. Reportedly patient son who lives in [place] has power of attorney. Patient daughter who lives in [place]  willing to obtain rewritten medical care power of attorney if needed.   Past Psychiatric History: Patient has no history of mental illness.   Risk to Self: Is patient at risk for suicide?: No Risk to Others:   Prior Inpatient Therapy:   Prior Outpatient Therapy:    Past Medical History:  Past Medical History:  Diagnosis Date  . Anemia   . Anxiety   . Arrhythmia   . Diabetes mellitus without complication ([place])   . Diverticulitis   . GERD (gastroesophageal reflux disease)   . Hypertension   . Presence of permanent cardiac pacemaker    hx/notes 11/25/2016  . Rectal prolapse   . Renal disorder   . Rhabdomyolysis 11/2016  . Sick sinus syndrome ([place])    hx/notes 11/25/2016  . Sinus node dysfunction ([facility])   . TIA (transient ischemic attack) 07/2016   hx/notes 11/25/2016    Past Surgical History:  Procedure Laterality Date  . ABDOMINAL HYSTERECTOMY    . ABDOMINAL SURGERY    . CARDIAC SURGERY    . CATARACT EXTRACTION Bilateral   . CHOLECYSTECTOMY    . COLON SURGERY    . HERNIA REPAIR    . INSERT / REPLACE / REMOVE PACEMAKER     hx/notes 11/25/2016  . KNEE SURGERY    . NASAL SINUS SURGERY     Family History:  Family History  Problem Relation Age of Onset  . Heart attack Father   . Heart attack Brother    Family Psychiatric  History: Family history of mental illness is not significant.  Social History:  History  Alcohol Use No     History  Drug Use No    Social  History   Social History  . Marital status: Married    Spouse name: N/A  . Number of children: N/A  . Years of education: N/A   Social History Main Topics  . Smoking status: Former Research scientist (life sciences)  . Smokeless tobacco: Never Used  . Alcohol use No  . Drug use: No  . Sexual activity: No   Other Topics Concern  . None   Social History Narrative  . None   Additional Social History:    Allergies:   Allergies  Allergen Reactions  . Morphine Nausea And Vomiting  . Sulfa Antibiotics Anaphylaxis and Rash   . Amoxicillin Other (See Comments)  . Benzonatate Other (See Comments)  . Buprenorphine Nausea And Vomiting  . Fish-Derived Products Nausea Only  . Penicillins Hives    Has patient had a PCN reaction causing immediate rash, facial/tongue/throat swelling, SOB or lightheadedness with hypotension: Yes Has patient had a PCN reaction causing severe rash involving mucus membranes or skin necrosis: Unknown Has patient had a PCN reaction that required hospitalization: Unknown Has patient had a PCN reaction occurring within the last 10 years: No If all of the above answers are "NO", then may proceed with Cephalosporin use.   . Ciprofloxacin Itching and Rash  . Morphine And Related Nausea And Vomiting    Labs:  Results for orders placed or performed during the hospital encounter of 11/25/16 (from the past 48 hour(s))  CBC with Differential     Status: Abnormal   Collection Time: 11/25/16  1:00 PM  Result Value Ref Range   WBC 16.4 (H) 4.0 - 10.5 K/uL   RBC 5.06 3.87 - 5.11 MIL/uL   Hemoglobin 15.5 (H) 12.0 - 15.0 g/dL   HCT 45.0 36.0 - 46.0 %   MCV 88.9 78.0 - 100.0 fL   MCH 30.6 26.0 - 34.0 pg   MCHC 34.4 30.0 - 36.0 g/dL   RDW 13.3 11.5 - 15.5 %   Platelets 184 150 - 400 K/uL   Neutrophils Relative % 79 %   Neutro Abs 12.9 (H) 1.7 - 7.7 K/uL   Lymphocytes Relative 15 %   Lymphs Abs 2.4 0.7 - 4.0 K/uL   Monocytes Relative 6 %   Monocytes Absolute 1.0 0.1 - 1.0 K/uL   Eosinophils Relative 0 %   Eosinophils Absolute 0.0 0.0 - 0.7 K/uL   Basophils Relative 0 %   Basophils Absolute 0.0 0.0 - 0.1 K/uL  CK     Status: Abnormal   Collection Time: 11/25/16  2:20 PM  Result Value Ref Range   Total CK 989 (H) 38 - 234 U/L  CBC with Differential     Status: Abnormal   Collection Time: 11/25/16  2:20 PM  Result Value Ref Range   WBC 15.7 (H) 4.0 - 10.5 K/uL   RBC 4.66 3.87 - 5.11 MIL/uL   Hemoglobin 13.8 12.0 - 15.0 g/dL   HCT 42.1 36.0 - 46.0 %   MCV 90.3 78.0 - 100.0 fL   MCH 29.6  26.0 - 34.0 pg   MCHC 32.8 30.0 - 36.0 g/dL   RDW 13.2 11.5 - 15.5 %   Platelets 233 150 - 400 K/uL   Neutrophils Relative % 78 %   Neutro Abs 12.2 (H) 1.7 - 7.7 K/uL   Lymphocytes Relative 15 %   Lymphs Abs 2.3 0.7 - 4.0 K/uL   Monocytes Relative 7 %   Monocytes Absolute 1.1 (H) 0.1 - 1.0 K/uL   Eosinophils Relative 0 %  Eosinophils Absolute 0.0 0.0 - 0.7 K/uL   Basophils Relative 0 %   Basophils Absolute 0.0 0.0 - 0.1 K/uL  Troponin I     Status: Abnormal   Collection Time: 11/25/16  2:20 PM  Result Value Ref Range   Troponin I 0.06 (HH) <0.03 ng/mL    Comment: REPEATED TO VERIFY CRITICAL RESULT CALLED TO, READ BACK BY AND VERIFIED WITH: R RICHARDS,RN 1630 11/25/2016 WBOND   Comprehensive metabolic panel     Status: Abnormal   Collection Time: 11/25/16  2:20 PM  Result Value Ref Range   Sodium 141 135 - 145 mmol/L   Potassium 3.4 (L) 3.5 - 5.1 mmol/L   Chloride 102 101 - 111 mmol/L   CO2 25 22 - 32 mmol/L   Glucose, Bld 216 (H) 65 - 99 mg/dL   BUN 61 (H) 6 - 20 mg/dL   Creatinine, Ser 1.79 (H) 0.44 - 1.00 mg/dL   Calcium 9.5 8.9 - 10.3 mg/dL   Total Protein 6.6 6.5 - 8.1 g/dL   Albumin 3.6 3.5 - 5.0 g/dL   AST 61 (H) 15 - 41 U/L   ALT 43 14 - 54 U/L   Alkaline Phosphatase 79 38 - 126 U/L   Total Bilirubin 1.3 (H) 0.3 - 1.2 mg/dL   GFR calc non Af Amer 26 (L) >60 mL/min   GFR calc Af Amer 30 (L) >60 mL/min    Comment: (NOTE) The eGFR has been calculated using the CKD EPI equation. This calculation has not been validated in all clinical situations. eGFR's persistently <60 mL/min signify possible Chronic Kidney Disease.    Anion gap 14 5 - 15  Urinalysis, Routine w reflex microscopic     Status: Abnormal   Collection Time: 11/25/16  2:59 PM  Result Value Ref Range   Color, Urine YELLOW YELLOW   APPearance CLEAR CLEAR   Specific Gravity, Urine 1.017 1.005 - 1.030   pH 5.0 5.0 - 8.0   Glucose, UA 50 (A) NEGATIVE mg/dL   Hgb urine dipstick NEGATIVE NEGATIVE    Bilirubin Urine NEGATIVE NEGATIVE   Ketones, ur NEGATIVE NEGATIVE mg/dL   Protein, ur >=300 (A) NEGATIVE mg/dL   Nitrite NEGATIVE NEGATIVE   Leukocytes, UA NEGATIVE NEGATIVE   RBC / HPF 0-5 0 - 5 RBC/hpf   WBC, UA 0-5 0 - 5 WBC/hpf   Bacteria, UA RARE (A) NONE SEEN   Squamous Epithelial / LPF 0-5 (A) NONE SEEN  Troponin I (q 6hr x 3)     Status: Abnormal   Collection Time: 11/25/16  8:37 PM  Result Value Ref Range   Troponin I 0.10 (HH) <0.03 ng/mL    Comment: CRITICAL VALUE NOTED.  VALUE IS CONSISTENT WITH PREVIOUSLY REPORTED AND CALLED VALUE.  Glucose, capillary     Status: Abnormal   Collection Time: 11/25/16 10:41 PM  Result Value Ref Range   Glucose-Capillary 167 (H) 65 - 99 mg/dL  Troponin I (q 6hr x 3)     Status: Abnormal   Collection Time: 11/26/16  2:28 AM  Result Value Ref Range   Troponin I 0.07 (HH) <0.03 ng/mL    Comment: CRITICAL VALUE NOTED.  VALUE IS CONSISTENT WITH PREVIOUSLY REPORTED AND CALLED VALUE.  Basic metabolic panel     Status: Abnormal   Collection Time: 11/26/16  2:28 AM  Result Value Ref Range   Sodium 141 135 - 145 mmol/L   Potassium 3.1 (L) 3.5 - 5.1 mmol/L   Chloride 108 101 -  111 mmol/L   CO2 23 22 - 32 mmol/L   Glucose, Bld 239 (H) 65 - 99 mg/dL   BUN 62 (H) 6 - 20 mg/dL   Creatinine, Ser 1.55 (H) 0.44 - 1.00 mg/dL   Calcium 8.6 (L) 8.9 - 10.3 mg/dL   GFR calc non Af Amer 30 (L) >60 mL/min   GFR calc Af Amer 35 (L) >60 mL/min    Comment: (NOTE) The eGFR has been calculated using the CKD EPI equation. This calculation has not been validated in all clinical situations. eGFR's persistently <60 mL/min signify possible Chronic Kidney Disease.    Anion gap 10 5 - 15  CBC     Status: Abnormal   Collection Time: 11/26/16  2:28 AM  Result Value Ref Range   WBC 13.4 (H) 4.0 - 10.5 K/uL   RBC 4.33 3.87 - 5.11 MIL/uL   Hemoglobin 12.6 12.0 - 15.0 g/dL   HCT 38.9 36.0 - 46.0 %   MCV 89.8 78.0 - 100.0 fL   MCH 29.1 26.0 - 34.0 pg   MCHC 32.4  30.0 - 36.0 g/dL   RDW 13.0 11.5 - 15.5 %   Platelets 208 150 - 400 K/uL  CK     Status: Abnormal   Collection Time: 11/26/16  2:28 AM  Result Value Ref Range   Total CK 394 (H) 38 - 234 U/L  Glucose, capillary     Status: Abnormal   Collection Time: 11/26/16  7:53 AM  Result Value Ref Range   Glucose-Capillary 172 (H) 65 - 99 mg/dL  Troponin I (q 6hr x 3)     Status: Abnormal   Collection Time: 11/26/16  9:44 AM  Result Value Ref Range   Troponin I 0.06 (HH) <0.03 ng/mL    Comment: CRITICAL VALUE NOTED.  VALUE IS CONSISTENT WITH PREVIOUSLY REPORTED AND CALLED VALUE.  Glucose, capillary     Status: Abnormal   Collection Time: 11/26/16 12:08 PM  Result Value Ref Range   Glucose-Capillary 255 (H) 65 - 99 mg/dL  Glucose, capillary     Status: Abnormal   Collection Time: 11/26/16  5:19 PM  Result Value Ref Range   Glucose-Capillary 146 (H) 65 - 99 mg/dL  Glucose, capillary     Status: Abnormal   Collection Time: 11/26/16  9:35 PM  Result Value Ref Range   Glucose-Capillary 224 (H) 65 - 99 mg/dL   Comment 1 Notify RN   CBC     Status: Abnormal   Collection Time: 11/27/16  3:25 AM  Result Value Ref Range   WBC 9.3 4.0 - 10.5 K/uL   RBC 3.76 (L) 3.87 - 5.11 MIL/uL   Hemoglobin 11.0 (L) 12.0 - 15.0 g/dL   HCT 34.1 (L) 36.0 - 46.0 %   MCV 90.7 78.0 - 100.0 fL   MCH 29.3 26.0 - 34.0 pg   MCHC 32.3 30.0 - 36.0 g/dL   RDW 13.0 11.5 - 15.5 %   Platelets 169 150 - 400 K/uL  Comprehensive metabolic panel     Status: Abnormal   Collection Time: 11/27/16  3:25 AM  Result Value Ref Range   Sodium 140 135 - 145 mmol/L   Potassium 3.7 3.5 - 5.1 mmol/L   Chloride 111 101 - 111 mmol/L   CO2 23 22 - 32 mmol/L   Glucose, Bld 148 (H) 65 - 99 mg/dL   BUN 57 (H) 6 - 20 mg/dL   Creatinine, Ser 1.49 (H) 0.44 - 1.00 mg/dL  Calcium 8.3 (L) 8.9 - 10.3 mg/dL   Total Protein 5.2 (L) 6.5 - 8.1 g/dL   Albumin 2.7 (L) 3.5 - 5.0 g/dL   AST 33 15 - 41 U/L   ALT 32 14 - 54 U/L   Alkaline Phosphatase  63 38 - 126 U/L   Total Bilirubin 1.0 0.3 - 1.2 mg/dL   GFR calc non Af Amer 32 (L) >60 mL/min   GFR calc Af Amer 37 (L) >60 mL/min    Comment: (NOTE) The eGFR has been calculated using the CKD EPI equation. This calculation has not been validated in all clinical situations. eGFR's persistently <60 mL/min signify possible Chronic Kidney Disease.    Anion gap 6 5 - 15  CK     Status: Abnormal   Collection Time: 11/27/16  3:25 AM  Result Value Ref Range   Total CK 292 (H) 38 - 234 U/L  Glucose, capillary     Status: Abnormal   Collection Time: 11/27/16  7:47 AM  Result Value Ref Range   Glucose-Capillary 137 (H) 65 - 99 mg/dL  Glucose, capillary     Status: Abnormal   Collection Time: 11/27/16 11:54 AM  Result Value Ref Range   Glucose-Capillary 160 (H) 65 - 99 mg/dL    Current Facility-Administered Medications  Medication Dose Route Frequency Provider Last Rate Last Dose  . 0.9 %  sodium chloride infusion   Intravenous Continuous Carlyle Dolly, MD 125 mL/hr at 11/27/16 0542    . acetaminophen (TYLENOL) tablet 650 mg  650 mg Oral Q6H PRN Carlyle Dolly, MD   650 mg at 11/27/16 0542   Or  . acetaminophen (TYLENOL) suppository 650 mg  650 mg Rectal Q6H PRN Carlyle Dolly, MD      . allopurinol (ZYLOPRIM) tablet 100 mg  100 mg Oral Daily PRN Carlyle Dolly, MD      . aspirin EC tablet 81 mg  81 mg Oral Daily Carlyle Dolly, MD   81 mg at 11/27/16 0932  . carvedilol (COREG) tablet 12.5 mg  12.5 mg Oral BID WC Carlyle Dolly, MD   12.5 mg at 11/27/16 0932  . DULoxetine (CYMBALTA) DR capsule 30 mg  30 mg Oral BID Carlyle Dolly, MD   30 mg at 11/27/16 0933  . enoxaparin (LOVENOX) injection 30 mg  30 mg Subcutaneous Q24H Carlyle Dolly, MD   30 mg at 11/27/16 0933  . insulin aspart (novoLOG) injection 0-15 Units  0-15 Units Subcutaneous TID WC Carlyle Dolly, MD   3 Units at 11/27/16 1241  . loratadine (CLARITIN) tablet 10 mg  10 mg  Oral Daily PRN Carlyle Dolly, MD   10 mg at 11/26/16 1348  . nitroGLYCERIN (NITROSTAT) SL tablet 0.4 mg  0.4 mg Sublingual Q5 min PRN Carlyle Dolly, MD      . ondansetron [facility]) tablet 4 mg  4 mg Oral Q6H PRN Carlyle Dolly, MD       Or  . ondansetron (ZOFRAN) injection 4 mg  4 mg Intravenous Q6H PRN Carlyle Dolly, MD      . pantoprazole (PROTONIX) EC tablet 20 mg  20 mg Oral Daily Carlyle Dolly, MD   20 mg at 11/27/16 0932  . polyethylene glycol (MIRALAX / GLYCOLAX) packet 17 g  17 g Oral Daily PRN Carlyle Dolly, MD      . sodium bicarbonate tablet 650 mg  650 mg Oral BID Gambino, Christina M,  MD   650 mg at 11/27/16 0933  . traMADol (ULTRAM) tablet 50 mg  50 mg Oral Q12H PRN Sherene Sires, DO        Musculoskeletal: Strength & Muscle Tone: decreased Gait & Station: unsteady Patient leans: N/A  Psychiatric Specialty Exam: Physical Exam as per history and physical   ROS complaining about right chest pain when moves her upper body secondary to rib fractures. Patient has no nausea, vomiting, abdominal pain, shortness of breath and chest pain. No Fever-chills, No Headache, No changes with Vision or hearing, reports vertigo No problems swallowing food or Liquids, No Chest pain, Cough or Shortness of Breath, No Abdominal pain, No Nausea or Vommitting, Bowel movements are regular, No Blood in stool or Urine, No dysuria, No new skin rashes or bruises, No new joints pains-aches,  No new weakness, tingling, numbness in any extremity, No recent weight gain or loss, No polyuria, polydypsia or polyphagia,  A full 10 point Review of Systems was done, except as stated above, all other Review of Systems were negative.  Blood pressure (!) 154/64, pulse 72, temperature 98 F (36.7 C), temperature source Oral, resp. rate 20, weight 62.1 kg (137 lb), SpO2 92 %.Body mass index is 23.52 kg/m.  General Appearance: Casual, has multiple bruises on forearm.  Eye  Contact:  Good  Speech:  Clear and Coherent and Slow  Volume:  Decreased  Mood:  Euthymic  Affect:  Appropriate and Congruent  Thought Process:  Coherent and Goal Directed  Orientation:  Full (Time, Place, and Person)  Thought Content:  WDL  Suicidal Thoughts:  No  Homicidal Thoughts:  No  Memory:  Immediate;   Fair Recent;   Fair Remote;   Fair  Judgement:  Intact  Insight:  Good  Psychomotor Activity:  Decreased  Concentration:  Concentration: Good and Attention Span: Good  Recall:  Good  Fund of Knowledge:  Good  Language:  Good  Akathisia:  Negative  Handed:  Right  AIMS (if indicated):     Assets:  Communication Skills Desire for Improvement Financial Resources/Insurance Housing Leisure Time Resilience Social Support Transportation  ADL's:  Impaired  Cognition:  Impaired,  Mild  Sleep:        Treatment Plan Summary: 83 years old female with no history of intravenous presented with rhabdomyolysis secondary to fall at her bathroom. Patient has intact cognitions and has decreased memory secondary to age-related and has a ongoing mild to moderate depression. Patient has no suicidal/homicidal ideation, intention or plans. Patient denies evidence of psychosis.  Based on my evaluation patient meets criteria for capacity to make her own medical decisions and living arrangements. Continue her current medication management including Cymbalta 30 mg daily for ongoing depression secondary to being alone. Daily contact with patient to assess and evaluate symptoms and progress in treatment and Medication management  Disposition: No evidence of imminent risk to self or others at present.   Patient does not meet criteria for psychiatric inpatient admission. Supportive therapy provided about ongoing stressors.  Ambrose Finland, MD 11/27/2016 12:58 PM

## 2016-11-27 NOTE — [place] FL2 (Signed)
[place] MEDICAID [place]2 LEVEL OF CARE SCREENING TOOL     IDENTIFICATION  Patient Name: Monica Choi Birthdate: 01-09-1934 Sex: female Admission Date (Current Location): 11/25/2016  [facility] and [place] Number:  Producer, television/film/video and Address:  [facility] [place], [address], [place], [place] [address]      Provider Number: [id number]  Attending Physician Name and Address:  Nestor Ramp, MD  Relative Name and Phone Number:       Current Level of Care: Hospital Recommended Level of Care: Skilled Nursing Facility Prior Approval Number:    Date Approved/Denied:   PASRR Number: [id number]  Discharge Plan: SNF    Current Diagnoses: Patient Active Problem List   Diagnosis Date Noted  . Rhabdomyolysis 11/25/2016  . Nonintractable headache   . TIA (transient ischemic attack) 09/17/2016  . Hypertension 07/17/2016  . Sick sinus syndrome (HCC) 07/17/2016    Orientation RESPIRATION BLADDER Height & Weight     Self, Time, Situation, Place  Normal Continent Weight: 62.1 kg (137 lb) Height:     BEHAVIORAL SYMPTOMS/MOOD NEUROLOGICAL BOWEL NUTRITION STATUS      Continent Diet (Please see DC Summary)  AMBULATORY STATUS COMMUNICATION OF NEEDS Skin   Extensive Assist Verbally Normal                       Personal Care Assistance Level of Assistance  Bathing, Feeding, Dressing Bathing Assistance: Maximum assistance Feeding assistance: Limited assistance Dressing Assistance: Limited assistance     Functional Limitations Info             SPECIAL CARE FACTORS FREQUENCY  PT (By licensed PT), OT (By licensed OT)     PT Frequency: 5x/week OT Frequency: 3x/week            Contractures      Additional Factors Info  Code Status, Allergies, Psychotropic, Insulin Sliding Scale Code Status Info: Full Allergies Info: Morphine, Sulfa Antibiotics, Amoxicillin, Benzonatate, Buprenorphine, Fish-derived Products, Penicillins, Ciprofloxacin, Morphine  And Related Psychotropic Info: Cymbalta Insulin Sliding Scale Info: 3x daily with meals       Current Medications (11/27/2016):  This is the current hospital active medication list Current Facility-Administered Medications  Medication Dose Route Frequency Provider Last Rate Last Dose  . 0.9 %  sodium chloride infusion   Intravenous Continuous Beaulah Dinning, MD 125 mL/hr at 11/27/16 0542    . acetaminophen (TYLENOL) tablet 650 mg  650 mg Oral Q6H PRN Beaulah Dinning, MD   650 mg at 11/27/16 0542   Or  . acetaminophen (TYLENOL) suppository 650 mg  650 mg Rectal Q6H PRN Beaulah Dinning, MD      . allopurinol (ZYLOPRIM) tablet 100 mg  100 mg Oral Daily PRN Beaulah Dinning, MD      . aspirin EC tablet 81 mg  81 mg Oral Daily Beaulah Dinning, MD   81 mg at 11/27/16 0932  . carvedilol (COREG) tablet 12.5 mg  12.5 mg Oral BID WC Beaulah Dinning, MD   12.5 mg at 11/27/16 0932  . DULoxetine (CYMBALTA) DR capsule 30 mg  30 mg Oral BID Beaulah Dinning, MD   30 mg at 11/27/16 0933  . enoxaparin (LOVENOX) injection 30 mg  30 mg Subcutaneous Q24H Beaulah Dinning, MD   30 mg at 11/27/16 0933  . insulin aspart (novoLOG) injection 0-15 Units  0-15 Units Subcutaneous TID WC Beaulah Dinning, MD   2 Units at  11/27/16 0934  . loratadine (CLARITIN) tablet 10 mg  10 mg Oral Daily PRN Beaulah Dinning, MD   10 mg at 11/26/16 1348  . nitroGLYCERIN (NITROSTAT) SL tablet 0.4 mg  0.4 mg Sublingual Q5 min PRN Beaulah Dinning, MD      . ondansetron [facility]) tablet 4 mg  4 mg Oral Q6H PRN Beaulah Dinning, MD       Or  . ondansetron (ZOFRAN) injection 4 mg  4 mg Intravenous Q6H PRN Beaulah Dinning, MD      . pantoprazole (PROTONIX) EC tablet 20 mg  20 mg Oral Daily Beaulah Dinning, MD   20 mg at 11/27/16 0932  . polyethylene glycol (MIRALAX / GLYCOLAX) packet 17 g  17 g Oral Daily PRN Beaulah Dinning, MD      . sodium bicarbonate tablet 650 mg  650  mg Oral BID Beaulah Dinning, MD   650 mg at 11/27/16 9163     Discharge Medications: Please see discharge summary for a list of discharge medications.  Relevant Imaging Results:  Relevant Lab Results:   Additional Information SSN: [id number] [place], [place]

## 2016-11-27 NOTE — Progress Notes (Signed)
Patient able to discharge to [facility] when medically stable. Patient's son-in-law to do admissions paperwork today. CSW started insurance auth.  Monica Choi LCSWA [phone]

## 2016-11-27 NOTE — Progress Notes (Signed)
Family Medicine Teaching Service Daily Progress Note Intern Pager: [phone]  Patient name: Monica Choi Medical record number: [id number] Date of birth: 03-06-1934 Age: 83 y.o. Gender: female  Primary Care Provider: Macy Mis, MD Consultants: None  Code Status:Full (per discussion admission)   Patient overview and major events to date: Monica Choi a 83 y.o.femalepresenting with a fall. PMH is significant for anemia, anxiety, GERD, hypertension, Sick Sinus Syndrome w/ pacemaker, TIA in April 2018.   CXR indicated multiple anterior nondisplaced rib fractures on right side anterior.   Assessment and Plan: Monica Choi a 83 y.o.femalepresenting with a fall. PMH is significant for anemia, anxiety, GERD, hypertension, Sick Sinus Syndrome w/ pacemaker, TIA in April 2018  Mild Rhabdomyolysis: There is evidence of recovering AKI with creatinine lowering to 1.49 today from 1.79 on admission. CBC showing mild leukocytosis to 13.4, hemoglobin normal at 13.8.  Rhabdomyolysis likely secondary to prolonged immobilization and dehydration after patient found down at home in her bathroom, Was likely down for at least 2 days. Patient cannot tell details of fall, likely has some baseline dementia over she is alert and oriented 3. Less likely infectious etiology given patient is afebrile with normal WC. -Admit to family medicine teaching service, telemetry, attending Dr. Jennette Kettle -Vitals per floor protocol -Maintenance IV fluids -We will track BMP -Check CK tomorrow morning -Ins and outs -kidney function appears to be recovering but will discuss need to consider nephrology consult if they worsen  Unwitnessed fall at home and down x 2 days: Unclear etiology of fall. CT head and neck showing no acute pathology including no signs of intracranial bleeding or CVA. No signs of infection with clear UA and no pneumonia on chest x-ray. Fall at home possibly mechanical in etiology vs syncope vs  dehydration vs orthostatic hypotension vs worsening dementia . -Telemetry as above  -significant orthostatic hypotension  Orthostatic hypotension: 160/66 lying, 104/51 standing (at 0 and3 min), could be volume related after fall, could have contribute to fall -will have PT evaluate later in hospitalization -PT/OT currently recommend 24hr care at SNF  Elevated Troponin: Likely secondary to rhabdo. Not endorsing any chest pain -repeat x1 scheduled tommorow morning  Hx of Sick sinus syndrome with pacemaker:  - Telemetry - EKG  T2DM: Last Hgb A1C 8.8 on 09/18/16.CBGs in high 100s to mid 200s -Moderate SSI  -CBG qac/hs -will calculate for lantus tonight  Hypertension: Normotensive on admission  -continue home Coreg 12.5 mg twice a day  -holding ramipril due to AKI  Acute on Chronic kidney injury: Patient with a history of CKD 3. Baseline creatinine ~1.1. On admission Creatinine is 1.79 but recovered to 1.55. Slight increase likely secondary to decrease po intake. -Daily BMP -Fluids as above -Will continue Sodium Bicarbonate  Right sided rib pain- likely MSK given specific response on deep inspiration or palpation.  Multiple non-displaced fractures on CXR -continue prn tylenol and incentive spirometry as tolerable  Anemia- 11.0 steady decline from 15.5 on admission.   Given dehydration this could be a dilutional effect that could be exposing her true baseline.   Given fall,a bleed must also be considered but is less likely as she came in high on her Hgb and had already been down for an estimated 2 days. -will track daily and monitor for any physical signs of anemia  Hypocalcemia- trending down to 8.3 from 9.5 on admission -not at threshold for replacement, will track  Depression: Patient with a history of depression and anxiety previously on Sertraline and Aricept.  Patient recently move to the area from [place] after loss of husband and decrease independence. PHQ-9 score was 7  on 05/09/2016.  -Will continue Duloxetine 30 mg bid  Cognitive decline: Patient's family reports increasing changes in mood and behaviors with concern for early sign of dementia.  - Moca testing -reached home psych, will pass contact to our inpatient psych team  Gout: Well controlled with home regimen -Will continue Allopurinol 100 mg daily  Hyperlipidemia -Continue Simvastatin 10 mg daily  GERD:Well controlled on home regimen -Will continue pantoprazole 20 mg daily   FEN/GI: Maintenance IVF, regular diet Prophylaxis: Lovenox  Disposition:SW consulted for snf  Subjective:  Patient in pain but very pleasant, she thinks she needs some help and would like to be somewhere with nurses to help her.   She knows her memory comes and goes and she is scared of not having anyoen around if she falls again.  Still very demented and unable to recall details of life consistently  Objective: Temp:  [97.8 F (36.6 C)-98.8 F (37.1 C)] 98 F (36.7 C) (08/23 0536) Pulse Rate:  [62-73] 69 (08/23 0536) Resp:  [16-20] 20 (08/23 0536) BP: (113-158)/(43-61) 158/59 (08/23 0536) SpO2:  [89 %-96 %] 92 % (08/23 0536) Weight:  [137 lb (62.1 kg)] 137 lb (62.1 kg) (08/23 0536) Physical Exam: General: In no acute distress, alert Eyes: EEOMI, nonicteric sclera  ENTM: Slightly dry mucous membranes Neck: Supple Cardiovascular: regular rate and rhythm, normal S1-S2, systolic murmur, DP pulses palpable bilaterally Respiratory: normal work of breathing, clear to auscultation bilaterally, could not deeply inhale without significant pain to lower right anterior ribs Gastrointestinal: Soft, nondistended, nontender, normal bowel sounds MSK: moves all extremities spontaneously  Neuro: Alert and oriented 3, no deficits noted, sensation grossly intact throughout, multiple areas of ecchymosis throughout extremities, superficial abrasions on legs and arms Psych: Normal mood and affect, was not able to remember  if anyone lived with her, did not remember how she got here, was concerned about living alone and agreed to placement although capacity assesment would need to be completed  Laboratory:  Recent Labs Lab 11/25/16 1420 11/26/16 0228 11/27/16 0325  WBC 15.7* 13.4* 9.3  HGB 13.8 12.6 11.0*  HCT 42.1 38.9 34.1*  PLT 233 208 169    Recent Labs Lab 11/25/16 1420 11/26/16 0228 11/27/16 0325  NA 141 141 140  K 3.4* 3.1* 3.7  CL 102 108 111  CO2 25 23 23   BUN 61* 62* 57*  CREATININE 1.79* 1.55* 1.49*  CALCIUM 9.5 8.6* 8.3*  PROT 6.6  --  5.2*  BILITOT 1.3*  --  1.0  ALKPHOS 79  --  63  ALT 43  --  32  AST 61*  --  33  GLUCOSE 216* 239* 148*      Imaging/Diagnostic Tests: Dg Chest 1 View  Result Date: 11/25/2016 CLINICAL DATA:  All. EXAM: CHEST 1 VIEW COMPARISON:  Chest x-ray dated October 08, 2016. FINDINGS: Left chest wall AICD in unchanged position with leads terminating in the right atrium and right ventricle. Enlarged cardiomediastinal silhouette, unchanged. Insert mild bibasilar atelectasis. Possible trace left pleural effusion. No pneumothorax or consolidation. No acute osseous abnormality. IMPRESSION: Unchanged cardiomegaly.  Possible trace left pleural effusion. Electronically Signed   By: Obie Dredge M.D.   On: 11/25/2016 12:00   Dg Ribs Unilateral Right  Result Date: 11/26/2016 CLINICAL DATA:  Lower right anterior rib pain after fall. EXAM: RIGHT RIBS - 2 VIEW COMPARISON:  Chest x-ray from yesterday.  FINDINGS: Nondisplaced fractures of the right anterior eighth and ninth ribs, and possibly the sixth and seventh ribs as well. Osteopenia. Cardiomegaly. Diffuse atherosclerotic vascular calcification. IMPRESSION: Nondisplaced fractures of the right anterior eighth and ninth ribs, possibly the sixth and seventh ribs as well. Electronically Signed   By: Obie Dredge M.D.   On: 11/26/2016 13:27   Dg Pelvis 1-2 Views  Result Date: 11/25/2016 CLINICAL DATA:  Fall. EXAM:  PELVIS - 1-2 VIEW COMPARISON:  Pelvic x-rays dated August 02, 2016. FINDINGS: There is no evidence of pelvic fracture or diastasis. No pelvic bone lesions are seen. Osteopenia. Degenerative changes of the lower lumbar spine. Bowel sutures project over the left sacrum. Bilateral buttock injection granulomas. IMPRESSION: No acute fracture or malalignment. If occult hip fracture is suspected or if the patient is unable to bear weight, MRI is the preferred modality for further evaluation. Electronically Signed   By: Obie Dredge M.D.   On: 11/25/2016 12:03   Ct Head Wo Contrast  Result Date: 11/25/2016 CLINICAL DATA:  Status post fall.  On the ground for 24 hours. EXAM: CT HEAD WITHOUT CONTRAST CT CERVICAL SPINE WITHOUT CONTRAST TECHNIQUE: Multidetector CT imaging of the head and cervical spine was performed following the standard protocol without intravenous contrast. Multiplanar CT image reconstructions of the cervical spine were also generated. COMPARISON:  None. FINDINGS: CT HEAD FINDINGS Brain: No evidence of acute infarction, hemorrhage, extra-axial collection, ventriculomegaly, or mass effect. Generalized cerebral atrophy. Periventricular white matter low attenuation likely secondary to microangiopathy. Vascular: Cerebrovascular atherosclerotic calcifications are noted. Skull: Negative for fracture or focal lesion. Sinuses/Orbits: Visualized portions of the orbits are unremarkable. Visualized portions of the paranasal sinuses and mastoid air cells are unremarkable. Other: None. CT CERVICAL SPINE FINDINGS Alignment: 2 mm anterolisthesis of C4 on C5 with bilateral facet arthropathy. Skull base and vertebrae: No acute fracture. No primary bone lesion or focal pathologic process. Soft tissues and spinal canal: No prevertebral fluid or swelling. No visible canal hematoma. Disc levels: Severe right facet arthropathy at C2-3. Severe bilateral facet arthropathy at C3-4. Severe bilateral facet arthropathy at C4-5.  Severe left facet arthropathy at C5-6. Broad-based disc bulge at C6-7 with severe left facet arthropathy. Severe right facet arthropathy at C7-T1. Upper chest:  Lung apices are clear. Other: No fluid collection or hematoma. Thoracic aortic atherosclerosis. IMPRESSION: 1. No acute intracranial pathology. 2. No acute osseous injury the cervical spine. 3. Cervical spine spondylosis as described above. Electronically Signed   By: Elige Ko   On: 11/25/2016 12:23   Ct Cervical Spine Wo Contrast  Result Date: 11/25/2016 CLINICAL DATA:  Status post fall.  On the ground for 24 hours. EXAM: CT HEAD WITHOUT CONTRAST CT CERVICAL SPINE WITHOUT CONTRAST TECHNIQUE: Multidetector CT imaging of the head and cervical spine was performed following the standard protocol without intravenous contrast. Multiplanar CT image reconstructions of the cervical spine were also generated. COMPARISON:  None. FINDINGS: CT HEAD FINDINGS Brain: No evidence of acute infarction, hemorrhage, extra-axial collection, ventriculomegaly, or mass effect. Generalized cerebral atrophy. Periventricular white matter low attenuation likely secondary to microangiopathy. Vascular: Cerebrovascular atherosclerotic calcifications are noted. Skull: Negative for fracture or focal lesion. Sinuses/Orbits: Visualized portions of the orbits are unremarkable. Visualized portions of the paranasal sinuses and mastoid air cells are unremarkable. Other: None. CT CERVICAL SPINE FINDINGS Alignment: 2 mm anterolisthesis of C4 on C5 with bilateral facet arthropathy. Skull base and vertebrae: No acute fracture. No primary bone lesion or focal pathologic process. Soft tissues and spinal canal:  No prevertebral fluid or swelling. No visible canal hematoma. Disc levels: Severe right facet arthropathy at C2-3. Severe bilateral facet arthropathy at C3-4. Severe bilateral facet arthropathy at C4-5. Severe left facet arthropathy at C5-6. Broad-based disc bulge at C6-7 with severe left  facet arthropathy. Severe right facet arthropathy at C7-T1. Upper chest:  Lung apices are clear. Other: No fluid collection or hematoma. Thoracic aortic atherosclerosis. IMPRESSION: 1. No acute intracranial pathology. 2. No acute osseous injury the cervical spine. 3. Cervical spine spondylosis as described above. Electronically Signed   By: Elige Ko   On: 11/25/2016 12:23     Marthenia Rolling, DO 11/27/2016, 6:37 AM PGY-1, [facility] Family Medicine FPTS Intern pager: [phone], text pages welcome

## 2016-11-27 NOTE — Progress Notes (Signed)
PT Cancellation Note  Patient Details Name: Monica Choi MRN: [id number] DOB: 1934-03-15   Cancelled Treatment:    Reason Eval/Treat Not Completed: Patient declined, no reason specified Pt refusing secondary to rib pain, despite max encouragement for participation. Will follow up as schedule allows.   Gladys Damme, PT, DPT  Acute Rehabilitation Services  Pager: [phone]    [name] 11/27/2016, 3:57 PM

## 2016-11-28 ENCOUNTER — Inpatient Hospital Stay (HOSPITAL_COMMUNITY): Payer: PPO

## 2016-11-28 DIAGNOSIS — E119 Type 2 diabetes mellitus without complications: Secondary | ICD-10-CM | POA: Diagnosis not present

## 2016-11-28 DIAGNOSIS — S2241XD Multiple fractures of ribs, right side, subsequent encounter for fracture with routine healing: Secondary | ICD-10-CM | POA: Diagnosis not present

## 2016-11-28 DIAGNOSIS — E860 Dehydration: Secondary | ICD-10-CM | POA: Diagnosis not present

## 2016-11-28 DIAGNOSIS — S2241XA Multiple fractures of ribs, right side, initial encounter for closed fracture: Secondary | ICD-10-CM | POA: Diagnosis not present

## 2016-11-28 DIAGNOSIS — K219 Gastro-esophageal reflux disease without esophagitis: Secondary | ICD-10-CM | POA: Diagnosis not present

## 2016-11-28 DIAGNOSIS — R4182 Altered mental status, unspecified: Secondary | ICD-10-CM | POA: Diagnosis not present

## 2016-11-28 DIAGNOSIS — R079 Chest pain, unspecified: Secondary | ICD-10-CM | POA: Diagnosis not present

## 2016-11-28 DIAGNOSIS — G8929 Other chronic pain: Secondary | ICD-10-CM | POA: Diagnosis not present

## 2016-11-28 DIAGNOSIS — M6282 Rhabdomyolysis: Secondary | ICD-10-CM | POA: Diagnosis not present

## 2016-11-28 DIAGNOSIS — W19XXXA Unspecified fall, initial encounter: Secondary | ICD-10-CM | POA: Diagnosis not present

## 2016-11-28 DIAGNOSIS — E569 Vitamin deficiency, unspecified: Secondary | ICD-10-CM | POA: Diagnosis not present

## 2016-11-28 DIAGNOSIS — I129 Hypertensive chronic kidney disease with stage 1 through stage 4 chronic kidney disease, or unspecified chronic kidney disease: Secondary | ICD-10-CM | POA: Diagnosis not present

## 2016-11-28 DIAGNOSIS — I10 Essential (primary) hypertension: Secondary | ICD-10-CM | POA: Diagnosis not present

## 2016-11-28 DIAGNOSIS — F419 Anxiety disorder, unspecified: Secondary | ICD-10-CM | POA: Diagnosis not present

## 2016-11-28 DIAGNOSIS — D649 Anemia, unspecified: Secondary | ICD-10-CM | POA: Diagnosis not present

## 2016-11-28 DIAGNOSIS — M109 Gout, unspecified: Secondary | ICD-10-CM | POA: Diagnosis not present

## 2016-11-28 DIAGNOSIS — R2689 Other abnormalities of gait and mobility: Secondary | ICD-10-CM | POA: Diagnosis not present

## 2016-11-28 DIAGNOSIS — F329 Major depressive disorder, single episode, unspecified: Secondary | ICD-10-CM | POA: Diagnosis not present

## 2016-11-28 DIAGNOSIS — T796XXA Traumatic ischemia of muscle, initial encounter: Secondary | ICD-10-CM | POA: Diagnosis not present

## 2016-11-28 DIAGNOSIS — J302 Other seasonal allergic rhinitis: Secondary | ICD-10-CM | POA: Diagnosis not present

## 2016-11-28 DIAGNOSIS — F0390 Unspecified dementia without behavioral disturbance: Secondary | ICD-10-CM | POA: Diagnosis not present

## 2016-11-28 DIAGNOSIS — Z9181 History of falling: Secondary | ICD-10-CM | POA: Diagnosis not present

## 2016-11-28 DIAGNOSIS — E1151 Type 2 diabetes mellitus with diabetic peripheral angiopathy without gangrene: Secondary | ICD-10-CM | POA: Diagnosis not present

## 2016-11-28 DIAGNOSIS — R748 Abnormal levels of other serum enzymes: Secondary | ICD-10-CM | POA: Diagnosis not present

## 2016-11-28 DIAGNOSIS — N183 Chronic kidney disease, stage 3 (moderate): Secondary | ICD-10-CM | POA: Diagnosis not present

## 2016-11-28 DIAGNOSIS — S299XXA Unspecified injury of thorax, initial encounter: Secondary | ICD-10-CM | POA: Diagnosis not present

## 2016-11-28 DIAGNOSIS — N179 Acute kidney failure, unspecified: Secondary | ICD-10-CM | POA: Diagnosis not present

## 2016-11-28 DIAGNOSIS — M6281 Muscle weakness (generalized): Secondary | ICD-10-CM | POA: Diagnosis not present

## 2016-11-28 DIAGNOSIS — R2681 Unsteadiness on feet: Secondary | ICD-10-CM | POA: Diagnosis not present

## 2016-11-28 DIAGNOSIS — Z950 Presence of cardiac pacemaker: Secondary | ICD-10-CM | POA: Diagnosis not present

## 2016-11-28 DIAGNOSIS — E1122 Type 2 diabetes mellitus with diabetic chronic kidney disease: Secondary | ICD-10-CM | POA: Diagnosis not present

## 2016-11-28 LAB — BASIC METABOLIC PANEL
ANION GAP: 8 (ref 5–15)
BUN: 28 mg/dL — ABNORMAL HIGH (ref 6–20)
CALCIUM: 8.5 mg/dL — AB (ref 8.9–10.3)
CO2: 24 mmol/L (ref 22–32)
Chloride: 113 mmol/L — ABNORMAL HIGH (ref 101–111)
Creatinine, Ser: 1.04 mg/dL — ABNORMAL HIGH (ref 0.44–1.00)
GFR calc non Af Amer: 49 mL/min — ABNORMAL LOW (ref >60)
GFR, EST AFRICAN AMERICAN: 57 mL/min — AB (ref >60)
Glucose, Bld: 133 mg/dL — ABNORMAL HIGH (ref 65–99)
POTASSIUM: 3.4 mmol/L — AB (ref 3.5–5.1)
Sodium: 145 mmol/L (ref 135–145)

## 2016-11-28 LAB — CBC
HEMATOCRIT: 33.2 % — AB (ref 36.0–46.0)
HEMOGLOBIN: 10.6 g/dL — AB (ref 12.0–15.0)
MCH: 29.1 pg (ref 26.0–34.0)
MCHC: 31.9 g/dL (ref 30.0–36.0)
MCV: 91.2 fL (ref 78.0–100.0)
Platelets: 149 10*3/uL — ABNORMAL LOW (ref 150–400)
RBC: 3.64 MIL/uL — AB (ref 3.87–5.11)
RDW: 13 % (ref 11.5–15.5)
WBC: 6.3 10*3/uL (ref 4.0–10.5)

## 2016-11-28 LAB — CK: Total CK: 160 U/L (ref 38–234)

## 2016-11-28 LAB — GLUCOSE, CAPILLARY
GLUCOSE-CAPILLARY: 154 mg/dL — AB (ref 65–99)
GLUCOSE-CAPILLARY: 225 mg/dL — AB (ref 65–99)
Glucose-Capillary: 131 mg/dL — ABNORMAL HIGH (ref 65–99)

## 2016-11-28 LAB — TROPONIN I: TROPONIN I: 0.03 ng/mL — AB (ref <0.03)

## 2016-11-28 MED ORDER — ACETAMINOPHEN 325 MG PO TABS
650.0000 mg | ORAL_TABLET | Freq: Four times a day (QID) | ORAL | Status: DC
  Administered 2016-11-28: 650 mg via ORAL
  Filled 2016-11-28: qty 2

## 2016-11-28 MED ORDER — ACETAMINOPHEN 650 MG RE SUPP: 650.0000 mg | Freq: Four times a day (QID) | RECTAL | Status: DC

## 2016-11-28 MED ORDER — HYDRALAZINE HCL 20 MG/ML IJ SOLN
10.0000 mg | Freq: Once | INTRAMUSCULAR | Status: AC
  Administered 2016-11-28: 10 mg via INTRAVENOUS
  Filled 2016-11-28: qty 1

## 2016-11-28 MED ORDER — TRAMADOL HCL 50 MG PO TABS: 50.0000 mg | ORAL_TABLET | Freq: Two times a day (BID) | ORAL | Status: AC | PRN

## 2016-11-28 NOTE — Progress Notes (Signed)
Report called to [place] at [place]. Pt's PIV removed and dressing placed. Pt left via PTAR at 1744.

## 2016-11-28 NOTE — Progress Notes (Signed)
Family Medicine Teaching Service Daily Progress Note Intern Pager: [phone]  Patient name: Monica Choi Medical record number: [id number] Date of birth: [birth year] Age: 83 y.o. Gender: female  Primary Care Provider: Macy Mis, MD Consultants: None  Code Status:Full (per discussion admission)   Patient overview and major events to date: Monica Choi a 83 y.o.femalepresenting with a fall. PMH is significant for anemia, anxiety, GERD, hypertension, Sick Sinus Syndrome w/ pacemaker, TIA in April 2018.   CXR indicated multiple anterior nondisplaced rib fractures on right side anterior.   Assessment and Plan: Monica Choi a 83 y.o.femalepresenting with a fall. PMH is significant for anemia, anxiety, GERD, hypertension, Sick Sinus Syndrome w/ pacemaker, TIA in April 2018  **morning labs not resulted at time of plan** Mild Rhabdomyolysis: There is evidence of recovering AKI with creatinine lowering to 1.49 today from1.79 on admission. CBC showing mild leukocytosis to 13.4, hemoglobin normal at 13.8.  Rhabdomyolysis likely secondary to prolonged immobilization and dehydration after patient found down at home in her bathroom, Was likely down for at least 2 days. Patient cannot tell details of fall, likely has some baseline dementia over she is alert and oriented 3. Less likely infectious etiology given patient is afebrile with normal WC. -Admit to family medicine teaching service, telemetry, attending Dr. Jennette Kettle -Vitals per floor protocol -Maintenance IV fluids -We will track BMP -Check CK tomorrow morning -Ins and outs -kidney function appears to be recovering but will discussneed to consider nephrology consult if they worsen  Unwitnessed fall at home and down x 2 days: Unclear etiology of fall. CT head and neck showing no acute pathology including no signs of intracranial bleeding or CVA. No signs of infection with clear UA and no pneumonia on chest x-ray. Fall at home  possibly mechanical in etiology vs syncope vs dehydration vs orthostatic hypotension vs worsening dementia . -Telemetry as above  -significant orthostatic hypotension  Orthostatic hypotension: 160/66 lying, 104/51 standing (at 0 and3 min), could be volume related after fall, could have contribute to fall -will have PT evaluate later in hospitalization -PT/OT currently recommend 24hr care at SNF  Elevated Troponin: Likely secondary to rhabdo. Not endorsing any chest pain -repeat x1 scheduled tommorow morning  Hx of Sick sinus syndrome with pacemaker:  - Telemetry - EKG  T2DM: Last Hgb A1C 8.8 on 09/18/16.CBGs  100 to mid 200s -Moderate SSI  -CBG qac/hs -will calculate for lantus tonight  Hypertension: Normotensive on admission  -continue home Coreg 12.5 mg twice a day  -holding ramipril due to AKI  Acute on Chronic kidney injury: Patient with a history of CKD 3. Baseline creatinine ~1.1. On admission Creatinine is 1.79 but recovered to 1.55. Slight increase likely secondary to decrease po intake. -Daily BMP -Fluids as above -Will continue Sodium Bicarbonate  Right sided rib pain-likely MSK given specific response on deep inspiration or palpation.  Multiple non-displaced fractures on CXR -continue prn tylenol and incentive spirometry as tolerable  Anemia- 11.0 steady decline from 15.5 on admission.   Given dehydration this could be a dilutional effect that could be exposing her true baseline.   Given fall,a bleed must also be considered but is less likely as she came in high on her Hgb and had already been down for an estimated 2 days. -will track daily and monitor for any physical signs of anemia  Hypocalcemia- trending down to 8.3 from 9.5 on admission -not at threshold for replacement, will track  Depression: Patient with a history of depression and anxiety previously  on Sertraline and Aricept. Patient recently move to the area from [place] after loss of husband and  decrease independence. PHQ-9 score was 7 on 05/09/2016.  -Will continue Duloxetine 30 mg bid  Cognitive decline: Patient's family reports increasing changes in mood and behaviors with concern for early sign of dementia.  - Moca testing -psych believes patient has capacity  Gout: Well controlled with home regimen -Will continue Allopurinol 100 mg daily  Hyperlipidemia -Continue Simvastatin 10 mg daily  GERD:Well controlled on home regimen -Will continue pantoprazole 20 mg daily   FEN/GI: Maintenance IVF, regular diet Prophylaxis: Lovenox  Disposition:SW has confirmed placement at [facility], family/patient agree, D/c when stable  Subjective:  Patient says ribs hurt but are manageable, she has trouble sleeping with all the noise of a hospital but is in good spirits  Objective: Temp:  [97.9 F (36.6 C)-98.4 F (36.9 C)] 97.9 F (36.6 C) (08/24 0532) Pulse Rate:  [60-72] 63 (08/24 0532) Resp:  [18] 18 (08/24 0532) BP: (144-182)/(52-64) 182/62 (08/24 0532) SpO2:  [93 %-97 %] 97 % (08/24 0532) Weight:  [141 lb 6.4 oz (64.1 kg)] 141 lb 6.4 oz (64.1 kg) (08/24 0532) Physical Exam: General: In no acute distress, alert Eyes: EEOMI, nonicteric sclera  Cardiovascular: regular rate and rhythm, normal S1-S2, systolic murmur, DP pulses palpable bilaterally Respiratory: normal work of breathing, clear to auscultation bilaterally, could not deeply inhale without significant pain to lower right anterior ribs Gastrointestinal: Soft, nondistended, nontender, normal bowel sounds MSK: moves all extremities spontaneously  Neuro: Alert and oriented 3, no deficits noted, sensation grossly intact throughout, multiple areas of ecchymosis throughout extremities, superficial abrasions on legs and arms Psych: Normal mood and affect, was not able to remember if anyone lived with her, did not remember how she got here, was concerned about living alone and agreed to placement although capacity  assesment would need to be completed  Laboratory:  Recent Labs Lab 11/25/16 1420 11/26/16 0228 11/27/16 0325  WBC 15.7* 13.4* 9.3  HGB 13.8 12.6 11.0*  HCT 42.1 38.9 34.1*  PLT 233 208 169    Recent Labs Lab 11/25/16 1420 11/26/16 0228 11/27/16 0325  NA 141 141 140  K 3.4* 3.1* 3.7  CL 102 108 111  CO2 25 23 23   BUN 61* 62* 57*  CREATININE 1.79* 1.55* 1.49*  CALCIUM 9.5 8.6* 8.3*  PROT 6.6  --  5.2*  BILITOT 1.3*  --  1.0  ALKPHOS 79  --  63  ALT 43  --  32  AST 61*  --  33  GLUCOSE 216* 239* 148*      Imaging/Diagnostic Tests: Dg Chest 1 View  Result Date: 11/25/2016 CLINICAL DATA:  All. EXAM: CHEST 1 VIEW COMPARISON:  Chest x-ray dated October 08, 2016. FINDINGS: Left chest wall AICD in unchanged position with leads terminating in the right atrium and right ventricle. Enlarged cardiomediastinal silhouette, unchanged. Insert mild bibasilar atelectasis. Possible trace left pleural effusion. No pneumothorax or consolidation. No acute osseous abnormality. IMPRESSION: Unchanged cardiomegaly.  Possible trace left pleural effusion. Electronically Signed   By: Obie Dredge M.D.   On: 11/25/2016 12:00   Dg Ribs Unilateral Right  Result Date: 11/26/2016 CLINICAL DATA:  Lower right anterior rib pain after fall. EXAM: RIGHT RIBS - 2 VIEW COMPARISON:  Chest x-ray from yesterday. FINDINGS: Nondisplaced fractures of the right anterior eighth and ninth ribs, and possibly the sixth and seventh ribs as well. Osteopenia. Cardiomegaly. Diffuse atherosclerotic vascular calcification. IMPRESSION: Nondisplaced fractures of the right  anterior eighth and ninth ribs, possibly the sixth and seventh ribs as well. Electronically Signed   By: Obie Dredge M.D.   On: 11/26/2016 13:27   Dg Pelvis 1-2 Views  Result Date: 11/25/2016 CLINICAL DATA:  Fall. EXAM: PELVIS - 1-2 VIEW COMPARISON:  Pelvic x-rays dated August 02, 2016. FINDINGS: There is no evidence of pelvic fracture or diastasis. No pelvic  bone lesions are seen. Osteopenia. Degenerative changes of the lower lumbar spine. Bowel sutures project over the left sacrum. Bilateral buttock injection granulomas. IMPRESSION: No acute fracture or malalignment. If occult hip fracture is suspected or if the patient is unable to bear weight, MRI is the preferred modality for further evaluation. Electronically Signed   By: Obie Dredge M.D.   On: 11/25/2016 12:03   Ct Head Wo Contrast  Result Date: 11/25/2016 CLINICAL DATA:  Status post fall.  On the ground for 24 hours. EXAM: CT HEAD WITHOUT CONTRAST CT CERVICAL SPINE WITHOUT CONTRAST TECHNIQUE: Multidetector CT imaging of the head and cervical spine was performed following the standard protocol without intravenous contrast. Multiplanar CT image reconstructions of the cervical spine were also generated. COMPARISON:  None. FINDINGS: CT HEAD FINDINGS Brain: No evidence of acute infarction, hemorrhage, extra-axial collection, ventriculomegaly, or mass effect. Generalized cerebral atrophy. Periventricular white matter low attenuation likely secondary to microangiopathy. Vascular: Cerebrovascular atherosclerotic calcifications are noted. Skull: Negative for fracture or focal lesion. Sinuses/Orbits: Visualized portions of the orbits are unremarkable. Visualized portions of the paranasal sinuses and mastoid air cells are unremarkable. Other: None. CT CERVICAL SPINE FINDINGS Alignment: 2 mm anterolisthesis of C4 on C5 with bilateral facet arthropathy. Skull base and vertebrae: No acute fracture. No primary bone lesion or focal pathologic process. Soft tissues and spinal canal: No prevertebral fluid or swelling. No visible canal hematoma. Disc levels: Severe right facet arthropathy at C2-3. Severe bilateral facet arthropathy at C3-4. Severe bilateral facet arthropathy at C4-5. Severe left facet arthropathy at C5-6. Broad-based disc bulge at C6-7 with severe left facet arthropathy. Severe right facet arthropathy at  C7-T1. Upper chest:  Lung apices are clear. Other: No fluid collection or hematoma. Thoracic aortic atherosclerosis. IMPRESSION: 1. No acute intracranial pathology. 2. No acute osseous injury the cervical spine. 3. Cervical spine spondylosis as described above. Electronically Signed   By: Elige Ko   On: 11/25/2016 12:23   Ct Cervical Spine Wo Contrast  Result Date: 11/25/2016 CLINICAL DATA:  Status post fall.  On the ground for 24 hours. EXAM: CT HEAD WITHOUT CONTRAST CT CERVICAL SPINE WITHOUT CONTRAST TECHNIQUE: Multidetector CT imaging of the head and cervical spine was performed following the standard protocol without intravenous contrast. Multiplanar CT image reconstructions of the cervical spine were also generated. COMPARISON:  None. FINDINGS: CT HEAD FINDINGS Brain: No evidence of acute infarction, hemorrhage, extra-axial collection, ventriculomegaly, or mass effect. Generalized cerebral atrophy. Periventricular white matter low attenuation likely secondary to microangiopathy. Vascular: Cerebrovascular atherosclerotic calcifications are noted. Skull: Negative for fracture or focal lesion. Sinuses/Orbits: Visualized portions of the orbits are unremarkable. Visualized portions of the paranasal sinuses and mastoid air cells are unremarkable. Other: None. CT CERVICAL SPINE FINDINGS Alignment: 2 mm anterolisthesis of C4 on C5 with bilateral facet arthropathy. Skull base and vertebrae: No acute fracture. No primary bone lesion or focal pathologic process. Soft tissues and spinal canal: No prevertebral fluid or swelling. No visible canal hematoma. Disc levels: Severe right facet arthropathy at C2-3. Severe bilateral facet arthropathy at C3-4. Severe bilateral facet arthropathy at C4-5. Severe left facet  arthropathy at C5-6. Broad-based disc bulge at C6-7 with severe left facet arthropathy. Severe right facet arthropathy at C7-T1. Upper chest:  Lung apices are clear. Other: No fluid collection or hematoma.  Thoracic aortic atherosclerosis. IMPRESSION: 1. No acute intracranial pathology. 2. No acute osseous injury the cervical spine. 3. Cervical spine spondylosis as described above. Electronically Signed   By: Elige Ko   On: 11/25/2016 12:23     Marthenia Rolling, DO 11/28/2016, 6:34 AM PGY-1, [facility] Family Medicine FPTS Intern pager: [phone], text pages welcome

## 2016-11-28 NOTE — Care Management Important Message (Signed)
Important Message  Patient Details  Name: Monica Choi MRN: [id number] Date of Birth: 14-Jun-1933   Medicare Important Message Given:  Yes    Jayde Daffin Abena 11/28/2016, 11:17 AM

## 2016-11-28 NOTE — Progress Notes (Cosign Needed)
FPTS Interim Progress Note  S:Paged to room by Fayrene Fearing, the patient's nurse who noted Ms. Montney had sudden onset of upper right sided chest pain at anterior axilla.  She had been sitting up at bedside sipping some water and "choked a bit" and while coughing the new pain started.   It is descrbied as severe, worse with movement and not radiating.  O: Not toxic appearing, not diaphoretic not in respiratory distress but was clearly in pain and quickly cycles Bps were 180-200/60-80 (manual and auto).  Lung sounds were symettrical and strong given the patient could not inhale deeply due to pain when she did (already had prior diagnosed rib fractures inferior to this new pain).   Heart was RRR.  Pain was specifically reproducible to palpation and very focal.   STAT CXR did not indicate pneumothorax, obvious cardiopulmonry acute process or displaced rib fracture although there was some R lower atelectasis.  ECG pending BP (!) 182/62 (BP Location: Left Arm)   Pulse 63   Temp 97.9 F (36.6 C) (Oral)   Resp 18   Wt 141 lb 6.4 oz (64.1 kg)   SpO2 97%   BMI 24.27 kg/m     A/P: Concern for cardiac cause is less given RRR consistent pulses, neg CXR and strong BP, ecg pending.  Pnuemothorax was considered due to prior rib fractures and potential for puntcure but they were non-displaced, no new injuries are noted on CXR, no pneumothorax visible on CXR and lung sounds are symmetric.  Likely MSK related, either new MSK injury from cough or aggravted from prior fall and not noted until now.   This etiology is supported by very specific reproduction to palpation and pain in that focal area at movement.  10mg  hydralazine given for BP, will discuss ongoing HTN management  Will review ECG and discuss these findings with team.   Marthenia Rolling, DO 11/28/2016, 9:40 AM PGY-1, [facility] Family Medicine Service pager [phone]

## 2016-11-28 NOTE — Discharge Summary (Signed)
Family Medicine Teaching [facility] Discharge Summary  Patient name: Monica Choi Medical record number: [id number] Date of birth: November 13, 1933 Age: 83 y.o. Gender: female Date of Admission: 11/25/2016  Date of Discharge: 11/28/16  Admitting Physician: Nestor Ramp, MD  Primary Care Provider: Macy Mis, MD Consultants: none  Indication for Hospitalization: rhabdomyelisis  Discharge Diagnoses/Problem List:  rhabdomyelsis HTN Multiple rib fractures Hyperlipidemia dementia  Disposition: to SNF for rehab, then hopefully to long term care with memory unit  Discharge Condition: stable  Discharge Exam: General: In no acute distress, alert Eyes: EEOMI, nonicteric sclera  Cardiovascular: regular rate and rhythm, normal S1-S2, systolic murmur, DP pulses palpable bilaterally Respiratory: normal work of breathing, clear to auscultation bilaterally, could not deeply inhale without significant pain to lower right anterior ribs Gastrointestinal: Soft, nondistended, nontender, normal bowel sounds MSK: moves all extremities spontaneously but withdiscomfort on right arm movement Neuro: Alert and oriented 3, no deficits noted, sensation grossly intact throughout, multiple areas of ecchymosis throughout extremities, superficial abrasions on legs and arms Psych: Normal mood and affect, was not able to remember if anyone lived with her, did not remember how she got here, was concerned about living alone and agreed to placement  Brief Hospital Course:  She was admitted after being found down for an undetermined amount of time at home, she was in delirium and dehydrated.   CK was elevated and fluid hydration was initiated to treat rhabdomyolysis.  CT head/spine was negative.  CK and kidney functions responded well to fluids and as she regained orientation she complained of R sided chest pain.   CXR indicated nondisplaced right sided anterior fractures from t6-T9.    She was started on  tramadol/tylenol for pain, dementia is such that she will often forget to ask for pain management.  Issues for Follow Up:  1. Patient has non-displaced rib fractures anteriorly on R side in ribs 6-9.   She will need pain management and following of her respiratory status. 2. She had rhabdomyelitis after being found down for an undetermined amount of time, we stopped her lisinopril and statin for kidney function.   Please evaluate clinically for the appropriate time to restart these. 3. Her dementia has progressed to the point where she is concerned about her ability to care for herself safely at home and we agree.   Please consider long term placement for her safety.  Significant Procedures:   Significant Labs and Imaging:   Recent Labs Lab 11/26/16 0228 11/27/16 0325 11/28/16 0716  WBC 13.4* 9.3 6.3  HGB 12.6 11.0* 10.6*  HCT 38.9 34.1* 33.2*  PLT 208 169 149*    Recent Labs Lab 11/25/16 1420 11/26/16 0228 11/27/16 0325 11/28/16 0716  NA 141 141 140 145  K 3.4* 3.1* 3.7 3.4*  CL 102 108 111 113*  CO2 25 23 23 24   GLUCOSE 216* 239* 148* 133*  BUN 61* 62* 57* 28*  CREATININE 1.79* 1.55* 1.49* 1.04*  CALCIUM 9.5 8.6* 8.3* 8.5*  ALKPHOS 79  --  63  --   AST 61*  --  33  --   ALT 43  --  32  --   ALBUMIN 3.6  --  2.7*  --     Dg Chest 1 View  Result Date: 11/25/2016 CLINICAL DATA:  All. EXAM: CHEST 1 VIEW COMPARISON:  Chest x-ray dated October 08, 2016. FINDINGS: Left chest wall AICD in unchanged position with leads terminating in the right atrium and right ventricle. Enlarged cardiomediastinal silhouette,  unchanged. Insert mild bibasilar atelectasis. Possible trace left pleural effusion. No pneumothorax or consolidation. No acute osseous abnormality. IMPRESSION: Unchanged cardiomegaly.  Possible trace left pleural effusion. Electronically Signed   By: Obie Dredge M.D.   On: 11/25/2016 12:00   Dg Ribs Unilateral Right  Result Date: 11/26/2016 CLINICAL DATA:  Lower right  anterior rib pain after fall. EXAM: RIGHT RIBS - 2 VIEW COMPARISON:  Chest x-ray from yesterday. FINDINGS: Nondisplaced fractures of the right anterior eighth and ninth ribs, and possibly the sixth and seventh ribs as well. Osteopenia. Cardiomegaly. Diffuse atherosclerotic vascular calcification. IMPRESSION: Nondisplaced fractures of the right anterior eighth and ninth ribs, possibly the sixth and seventh ribs as well. Electronically Signed   By: Obie Dredge M.D.   On: 11/26/2016 13:27   Dg Pelvis 1-2 Views  Result Date: 11/25/2016 CLINICAL DATA:  Fall. EXAM: PELVIS - 1-2 VIEW COMPARISON:  Pelvic x-rays dated August 02, 2016. FINDINGS: There is no evidence of pelvic fracture or diastasis. No pelvic bone lesions are seen. Osteopenia. Degenerative changes of the lower lumbar spine. Bowel sutures project over the left sacrum. Bilateral buttock injection granulomas. IMPRESSION: No acute fracture or malalignment. If occult hip fracture is suspected or if the patient is unable to bear weight, MRI is the preferred modality for further evaluation. Electronically Signed   By: Obie Dredge M.D.   On: 11/25/2016 12:03   Ct Head Wo Contrast  Result Date: 11/25/2016 CLINICAL DATA:  Status post fall.  On the ground for 24 hours. EXAM: CT HEAD WITHOUT CONTRAST CT CERVICAL SPINE WITHOUT CONTRAST TECHNIQUE: Multidetector CT imaging of the head and cervical spine was performed following the standard protocol without intravenous contrast. Multiplanar CT image reconstructions of the cervical spine were also generated. COMPARISON:  None. FINDINGS: CT HEAD FINDINGS Brain: No evidence of acute infarction, hemorrhage, extra-axial collection, ventriculomegaly, or mass effect. Generalized cerebral atrophy. Periventricular white matter low attenuation likely secondary to microangiopathy. Vascular: Cerebrovascular atherosclerotic calcifications are noted. Skull: Negative for fracture or focal lesion. Sinuses/Orbits: Visualized  portions of the orbits are unremarkable. Visualized portions of the paranasal sinuses and mastoid air cells are unremarkable. Other: None. CT CERVICAL SPINE FINDINGS Alignment: 2 mm anterolisthesis of C4 on C5 with bilateral facet arthropathy. Skull base and vertebrae: No acute fracture. No primary bone lesion or focal pathologic process. Soft tissues and spinal canal: No prevertebral fluid or swelling. No visible canal hematoma. Disc levels: Severe right facet arthropathy at C2-3. Severe bilateral facet arthropathy at C3-4. Severe bilateral facet arthropathy at C4-5. Severe left facet arthropathy at C5-6. Broad-based disc bulge at C6-7 with severe left facet arthropathy. Severe right facet arthropathy at C7-T1. Upper chest:  Lung apices are clear. Other: No fluid collection or hematoma. Thoracic aortic atherosclerosis. IMPRESSION: 1. No acute intracranial pathology. 2. No acute osseous injury the cervical spine. 3. Cervical spine spondylosis as described above. Electronically Signed   By: Elige Ko   On: 11/25/2016 12:23   Ct Cervical Spine Wo Contrast  Result Date: 11/25/2016 CLINICAL DATA:  Status post fall.  On the ground for 24 hours. EXAM: CT HEAD WITHOUT CONTRAST CT CERVICAL SPINE WITHOUT CONTRAST TECHNIQUE: Multidetector CT imaging of the head and cervical spine was performed following the standard protocol without intravenous contrast. Multiplanar CT image reconstructions of the cervical spine were also generated. COMPARISON:  None. FINDINGS: CT HEAD FINDINGS Brain: No evidence of acute infarction, hemorrhage, extra-axial collection, ventriculomegaly, or mass effect. Generalized cerebral atrophy. Periventricular white matter low attenuation likely secondary  to microangiopathy. Vascular: Cerebrovascular atherosclerotic calcifications are noted. Skull: Negative for fracture or focal lesion. Sinuses/Orbits: Visualized portions of the orbits are unremarkable. Visualized portions of the paranasal sinuses  and mastoid air cells are unremarkable. Other: None. CT CERVICAL SPINE FINDINGS Alignment: 2 mm anterolisthesis of C4 on C5 with bilateral facet arthropathy. Skull base and vertebrae: No acute fracture. No primary bone lesion or focal pathologic process. Soft tissues and spinal canal: No prevertebral fluid or swelling. No visible canal hematoma. Disc levels: Severe right facet arthropathy at C2-3. Severe bilateral facet arthropathy at C3-4. Severe bilateral facet arthropathy at C4-5. Severe left facet arthropathy at C5-6. Broad-based disc bulge at C6-7 with severe left facet arthropathy. Severe right facet arthropathy at C7-T1. Upper chest:  Lung apices are clear. Other: No fluid collection or hematoma. Thoracic aortic atherosclerosis. IMPRESSION: 1. No acute intracranial pathology. 2. No acute osseous injury the cervical spine. 3. Cervical spine spondylosis as described above. Electronically Signed   By: Elige Ko   On: 11/25/2016 12:23    Results/Tests Pending at Time of Discharge:   Discharge Medications:  Allergies as of 11/28/2016      Reactions   Morphine Nausea And Vomiting   Sulfa Antibiotics Anaphylaxis, Rash   Amoxicillin Other (See Comments)   Benzonatate Other (See Comments)   Buprenorphine Nausea And Vomiting   Fish-derived Products Nausea Only   Penicillins Hives   Has patient had a PCN reaction causing immediate rash, facial/tongue/throat swelling, SOB or lightheadedness with hypotension: Yes Has patient had a PCN reaction causing severe rash involving mucus membranes or skin necrosis: Unknown Has patient had a PCN reaction that required hospitalization: Unknown Has patient had a PCN reaction occurring within the last 10 years: No If all of the above answers are "NO", then may proceed with Cephalosporin use.   Ciprofloxacin Itching, Rash   Morphine And Related Nausea And Vomiting      Medication List    STOP taking these medications   atorvastatin 40 MG tablet Commonly  known as:  LIPITOR   ramipril 10 MG capsule Commonly known as:  ALTACE   sodium bicarbonate 650 MG tablet     TAKE these medications   ACETAMINOPHEN 8 HOUR 650 MG CR tablet Generic drug:  acetaminophen Take 1,300 mg by mouth every 8 (eight) hours as needed for pain.   allopurinol 100 MG tablet Commonly known as:  ZYLOPRIM Take 100 mg by mouth daily as needed (gout).   aspirin 81 MG EC tablet Take 1 tablet (81 mg total) by mouth daily.   carvedilol 12.5 MG tablet Commonly known as:  COREG Take 1 tablet (12.5 mg total) by mouth 2 (two) times daily.   DULoxetine 30 MG capsule Commonly known as:  CYMBALTA Take 30 mg by mouth 2 (two) times daily.   loratadine 10 MG tablet Commonly known as:  CLARITIN Take 10 mg by mouth daily as needed for allergies.   metoCLOPramide 10 MG tablet Commonly known as:  REGLAN Take 1 tablet (10 mg total) by mouth every 6 (six) hours as needed (for nausea/headache).   nitroGLYCERIN 0.4 MG SL tablet Commonly known as:  NITROSTAT Place 0.4 mg under the tongue every 5 (five) minutes as needed for chest pain.   pantoprazole 20 MG tablet Commonly known as:  PROTONIX Take 20 mg by mouth daily.   traMADol 50 MG tablet Commonly known as:  ULTRAM Take 1 tablet (50 mg total) by mouth every 12 (twelve) hours as needed for severe pain.  Vitamin D 2000 units Caps Take 2,000 Units by mouth daily.            Discharge Care Instructions        Start     Ordered   11/28/16 0000  traMADol (ULTRAM) 50 MG tablet  Every 12 hours PRN     11/28/16 1235      Discharge Instructions: Please refer to Patient Instructions section of EMR for full details.  Patient was counseled important signs and symptoms that should prompt return to medical care, changes in medications, dietary instructions, activity restrictions, and follow up appointments.   Follow-Up Appointments:   Marthenia Rolling, DO 11/28/2016, 12:47 PM PGY-1, [facility]

## 2016-11-28 NOTE — Progress Notes (Signed)
Physical Therapy Treatment Patient Details Name: Monica Choi MRN: [id number] DOB: 1933-10-30 Today's Date: 11/28/2016    History of Present Illness Pt is an 83 y/o female admitted secondary to sustaining a fall at home. Pt was down for approximately two days. Pt found to have mild rhabdomyolysis and acute on chronic kidney injury. PMH including but not limited to HTN, anxiety, DM, sick sinus syndrome s/p pacemaker placement.    PT Comments    Pt continues to report high pain hindering her ability to participate in PT session. Pt agreeable to ambulation in room to use BSC then to recliner. Pt positioned in recliner with chair alarm.    Follow Up Recommendations  SNF;Supervision/Assistance - 24 hour     Equipment Recommendations  None recommended by PT    Recommendations for Other Services       Precautions / Restrictions Precautions Precautions: Fall    Mobility  Bed Mobility Overal bed mobility: Needs Assistance Bed Mobility: Supine to Sit     Supine to sit: Min assist     General bed mobility comments: +rail, increased time  Transfers   Equipment used: Ambulation equipment used Transfers: Pharmacologist;Sit to/from Stand Sit to Stand: Min assist Stand pivot transfers: Min assist       General transfer comment: increased time, verbal cues for sequencing  Ambulation/Gait Ambulation/Gait assistance: Min assist Ambulation Distance (Feet): 10 Feet Assistive device: Rolling walker (2 wheeled) Gait Pattern/deviations: Decreased stride length;Step-through pattern Gait velocity: decreased Gait velocity interpretation: Below normal speed for age/gender     Stairs            Wheelchair Mobility    Modified Rankin (Stroke Patients Only)       Balance                                            Cognition Arousal/Alertness: Awake/alert Behavior During Therapy: WFL for tasks assessed/performed Overall Cognitive Status: No  family/caregiver present to determine baseline cognitive functioning Area of Impairment: Attention;Following commands;Problem solving;Awareness;Safety/judgement                   Current Attention Level: Selective   Following Commands: Follows one step commands with increased time;Follows multi-step commands inconsistently Safety/Judgement: Decreased awareness of deficits Awareness: Emergent Problem Solving: Slow processing;Decreased initiation;Difficulty sequencing;Requires verbal cues        Exercises      General Comments        Pertinent Vitals/Pain Pain Assessment: Faces Faces Pain Scale: Hurts whole lot Pain Location: R axillary region Pain Descriptors / Indicators: Sharp;Shooting;Guarding;Grimacing;Moaning Pain Intervention(s): Monitored during session;Repositioned;Heat applied    Home Living                      Prior Function            PT Goals (current goals can now be found in the care plan section) Acute Rehab PT Goals Patient Stated Goal: decrease pain PT Goal Formulation: With patient Time For Goal Achievement: 12/10/16 Potential to Achieve Goals: Good Progress towards PT goals: Progressing toward goals    Frequency    Min 3X/week      PT Plan Current plan remains appropriate    Co-evaluation              AM-PAC PT "6 Clicks" Daily Activity  Outcome Measure  Difficulty turning over  in bed (including adjusting bedclothes, sheets and blankets)?: A Lot Difficulty moving from lying on back to sitting on the side of the bed? : A Lot Difficulty sitting down on and standing up from a chair with arms (e.g., wheelchair, bedside commode, etc,.)?: Unable Help needed moving to and from a bed to chair (including a wheelchair)?: A Little Help needed walking in hospital room?: A Little Help needed climbing 3-5 steps with a railing? : A Lot 6 Click Score: 13    End of Session Equipment Utilized During Treatment: Gait belt Activity  Tolerance: Patient limited by pain Patient left: in chair;with call bell/phone within reach;with chair alarm set Nurse Communication: Mobility status;Other (comment) (Pt would like to put on her bra. Unable during PT session due to IV.) PT Visit Diagnosis: Other abnormalities of gait and mobility (R26.89);History of falling (Z91.81);Pain Pain - Right/Left: Right     Time: 1240-1305 PT Time Calculation (min) (ACUTE ONLY): 25 min  Charges:  $Gait Training: 8-22 mins $Therapeutic Activity: 8-22 mins                    G Codes:       Aida Raider, PT  Office # [phone] Pager [phone]    Ilda Foil 11/28/2016, 1:25 PM

## 2016-11-28 NOTE — Clinical Social Work Placement (Signed)
   CLINICAL SOCIAL WORK PLACEMENT  NOTE  Date:  11/28/2016  Patient Details  Name: Monica Choi MRN: [id number] Date of Birth: 16-Aug-1933  Clinical Social Work is seeking post-discharge placement for this patient at the Skilled  Nursing Facility level of care (*CSW will initial, date and re-position this form in  chart as items are completed):  Yes   Patient/family provided with [facility] Clinical Social Work Department's list of facilities offering this level of care within the geographic area requested by the patient (or if unable, by the patient's family).  Yes   Patient/family informed of their freedom to choose among providers that offer the needed level of care, that participate in Medicare, Medicaid or managed care program needed by the patient, have an available bed and are willing to accept the patient.  Yes   Patient/family informed of [facility]'s ownership interest in [facility] and [facility], as well as of the fact that they are under no obligation to receive care at these facilities.  PASRR submitted to EDS on 11/27/16     PASRR number received on 11/27/16     Existing PASRR number confirmed on       FL2 transmitted to all facilities in geographic area requested by pt/family on 11/27/16     FL2 transmitted to all facilities within larger geographic area on       Patient informed that his/her managed care company has contracts with or will negotiate with certain facilities, including the following:        Yes   Patient/family informed of bed offers received.  Patient chooses bed at [facility]     Physician recommends and patient chooses bed at      Patient to be transferred to [facility] on 11/28/16.  Patient to be transferred to facility by [facility]     Patient family notified on 11/28/16 of transfer.  Name of family member notified:  [name]     PHYSICIAN       Additional Comment:    _______________________________________________ Mearl Latin, LCSWA 11/28/2016, 2:18 PM

## 2016-11-28 NOTE — Significant Event (Signed)
Rapid Response Event Note  Overview:  Called by RN for patient with c/o r side chest pain Time Called: [id number] Arrival Time: 0840 Event Type: Cardiac  Initial Focused Assessment:  Called by RN for c/o right side chest pain and hypertension of 200's/100's.  On my arrival to patients room, RN and MD at bedside.  Patient lying in bed, alert and awake.     Interventions:  Orders per MD.  Patient with known rib fractures and has not received any pain medication, spoke with patient about the importance of taking pain medications and keeping her pain level low to help with healing and prevent periods of intense pain.  Also, educated the patient on the importance of deep breathing and coughing to prevent pneumonia and pain medicine would help achieve her ability to do that as well.   EKG, PCXR to be done  Plan of Care (if not transferred):  RN to monitor and call if assistance needed  Event Summary:   at      at          [facility], Maryagnes Amos

## 2016-11-28 NOTE — Progress Notes (Signed)
Patient will DC to: [facility] Anticipated DC date: 11/28/16 Family notified: [name] and daughter Transport by: [facility] 5:30pm   Per MD patient ready for DC to [facility] RN, patient, patient's family, and facility notified of DC. Discharge Summary sent to facility. RN given number for report [phone]). DC packet on chart. Ambulance transport requested for patient.   CSW signing off.  Cristobal Goldmann, [place] Clinical Social Worker [phone]

## 2016-11-28 NOTE — Discharge Instructions (Signed)
Monica Choi was seen in the hospital after a fall in which she was on the ground for an undetermined amount of time.    She suffered rhabdomyelis from this which is muscle damage from trauma and/or dehydration.   She was treated with fluids and recovered for transfer to SNF.   She also suffered a number of rib fractures that she has some pain medication for, it is important that she try to take deep breaths often to ward off risk of pneumonia.   We think it is important to consider long term care for her as we are worried about her safety given her mobility and dementia if she was to live alone.  We did stop her lisinopril and atrovastatin temporarily to allow her kidneys some time to recover.

## 2016-11-29 DIAGNOSIS — Z950 Presence of cardiac pacemaker: Secondary | ICD-10-CM | POA: Diagnosis not present

## 2016-11-29 DIAGNOSIS — N179 Acute kidney failure, unspecified: Secondary | ICD-10-CM | POA: Diagnosis not present

## 2016-11-29 DIAGNOSIS — M109 Gout, unspecified: Secondary | ICD-10-CM | POA: Diagnosis not present

## 2016-12-22 ENCOUNTER — Emergency Department: Payer: PPO

## 2016-12-22 ENCOUNTER — Emergency Department
Admission: EM | Admit: 2016-12-22 | Discharge: 2016-12-22 | Disposition: A | Discharge status: Home / Self Care | Payer: PPO | Attending: Emergency Medicine | Admitting: Emergency Medicine

## 2016-12-22 ENCOUNTER — Encounter: Payer: Self-pay | Admitting: Intensive Care

## 2016-12-22 DIAGNOSIS — R4182 Altered mental status, unspecified: Secondary | ICD-10-CM | POA: Diagnosis not present

## 2016-12-22 DIAGNOSIS — R8271 Bacteriuria: Secondary | ICD-10-CM | POA: Diagnosis not present

## 2016-12-22 DIAGNOSIS — S2241XA Multiple fractures of ribs, right side, initial encounter for closed fracture: Secondary | ICD-10-CM | POA: Insufficient documentation

## 2016-12-22 DIAGNOSIS — Y999 Unspecified external cause status: Secondary | ICD-10-CM | POA: Diagnosis not present

## 2016-12-22 DIAGNOSIS — Z87891 Personal history of nicotine dependence: Secondary | ICD-10-CM | POA: Diagnosis not present

## 2016-12-22 DIAGNOSIS — I10 Essential (primary) hypertension: Secondary | ICD-10-CM | POA: Diagnosis not present

## 2016-12-22 DIAGNOSIS — Z8673 Personal history of transient ischemic attack (TIA), and cerebral infarction without residual deficits: Secondary | ICD-10-CM | POA: Diagnosis not present

## 2016-12-22 DIAGNOSIS — R0789 Other chest pain: Secondary | ICD-10-CM | POA: Diagnosis not present

## 2016-12-22 DIAGNOSIS — S2241XD Multiple fractures of ribs, right side, subsequent encounter for fracture with routine healing: Secondary | ICD-10-CM

## 2016-12-22 DIAGNOSIS — Y9301 Activity, walking, marching and hiking: Secondary | ICD-10-CM | POA: Diagnosis not present

## 2016-12-22 DIAGNOSIS — R1010 Upper abdominal pain, unspecified: Secondary | ICD-10-CM | POA: Diagnosis not present

## 2016-12-22 DIAGNOSIS — R079 Chest pain, unspecified: Secondary | ICD-10-CM | POA: Diagnosis not present

## 2016-12-22 DIAGNOSIS — W19XXXA Unspecified fall, initial encounter: Secondary | ICD-10-CM | POA: Diagnosis not present

## 2016-12-22 DIAGNOSIS — R739 Hyperglycemia, unspecified: Secondary | ICD-10-CM

## 2016-12-22 DIAGNOSIS — Y92129 Unspecified place in nursing home as the place of occurrence of the external cause: Secondary | ICD-10-CM | POA: Insufficient documentation

## 2016-12-22 DIAGNOSIS — E1165 Type 2 diabetes mellitus with hyperglycemia: Secondary | ICD-10-CM | POA: Insufficient documentation

## 2016-12-22 DIAGNOSIS — S29091A Other injury of muscle and tendon of front wall of thorax, initial encounter: Secondary | ICD-10-CM | POA: Diagnosis present

## 2016-12-22 DIAGNOSIS — S0990XA Unspecified injury of head, initial encounter: Secondary | ICD-10-CM | POA: Diagnosis not present

## 2016-12-22 LAB — COMPREHENSIVE METABOLIC PANEL
ALK PHOS: 115 U/L (ref 38–126)
ALT: 31 U/L (ref 14–54)
ANION GAP: 10 (ref 5–15)
AST: 38 U/L (ref 15–41)
Albumin: 4 g/dL (ref 3.5–5.0)
BUN: 30 mg/dL — ABNORMAL HIGH (ref 6–20)
CALCIUM: 9.6 mg/dL (ref 8.9–10.3)
CO2: 23 mmol/L (ref 22–32)
Chloride: 105 mmol/L (ref 101–111)
Creatinine, Ser: 1.22 mg/dL — ABNORMAL HIGH (ref 0.44–1.00)
GFR calc non Af Amer: 41 mL/min — ABNORMAL LOW (ref >60)
GFR, EST AFRICAN AMERICAN: 47 mL/min — AB (ref >60)
Glucose, Bld: 223 mg/dL — ABNORMAL HIGH (ref 65–99)
POTASSIUM: 4 mmol/L (ref 3.5–5.1)
SODIUM: 138 mmol/L (ref 135–145)
Total Bilirubin: 0.7 mg/dL (ref 0.3–1.2)
Total Protein: 7.1 g/dL (ref 6.5–8.1)

## 2016-12-22 LAB — URINALYSIS, COMPLETE (UACMP) WITH MICROSCOPIC
BILIRUBIN URINE: NEGATIVE
Glucose, UA: NEGATIVE mg/dL
HGB URINE DIPSTICK: NEGATIVE
KETONES UR: NEGATIVE mg/dL
LEUKOCYTES UA: NEGATIVE
NITRITE: NEGATIVE
PH: 6 (ref 5.0–8.0)
Protein, ur: 100 mg/dL — AB
RBC / HPF: NONE SEEN RBC/hpf (ref 0–5)
SPECIFIC GRAVITY, URINE: 1.009 (ref 1.005–1.030)

## 2016-12-22 LAB — CBC
HCT: 38.1 % (ref 35.0–47.0)
HEMOGLOBIN: 13.1 g/dL (ref 12.0–16.0)
MCH: 31.3 pg (ref 26.0–34.0)
MCHC: 34.2 g/dL (ref 32.0–36.0)
MCV: 91.4 fL (ref 80.0–100.0)
PLATELETS: 212 10*3/uL (ref 150–440)
RBC: 4.17 MIL/uL (ref 3.80–5.20)
RDW: 14.3 % (ref 11.5–14.5)
WBC: 8.2 10*3/uL (ref 3.6–11.0)

## 2016-12-22 LAB — TROPONIN I

## 2016-12-22 MED ORDER — CARVEDILOL 25 MG PO TABS
ORAL_TABLET | ORAL | Status: AC
  Administered 2016-12-22: 12.5 mg via ORAL
  Filled 2016-12-22: qty 1

## 2016-12-22 MED ORDER — IOPAMIDOL (ISOVUE-370) INJECTION 76%
75.0000 mL | Freq: Once | INTRAVENOUS | Status: AC | PRN
  Administered 2016-12-22: 75 mL via INTRAVENOUS

## 2016-12-22 MED ORDER — HYDRALAZINE HCL 20 MG/ML IJ SOLN
10.0000 mg | Freq: Once | INTRAMUSCULAR | Status: AC
  Administered 2016-12-22: 10 mg via INTRAVENOUS
  Filled 2016-12-22: qty 1

## 2016-12-22 MED ORDER — ONDANSETRON 4 MG PO TBDP
4.0000 mg | ORAL_TABLET | Freq: Once | ORAL | Status: AC
  Administered 2016-12-22: 4 mg via ORAL

## 2016-12-22 MED ORDER — ACETAMINOPHEN 500 MG PO TABS
1000.0000 mg | ORAL_TABLET | Freq: Once | ORAL | Status: AC
  Administered 2016-12-22: 1000 mg via ORAL
  Filled 2016-12-22: qty 2

## 2016-12-22 MED ORDER — ONDANSETRON HCL 4 MG/2ML IJ SOLN: 4.0000 mg | Freq: Once | INTRAMUSCULAR | Status: DC

## 2016-12-22 MED ORDER — CARVEDILOL 6.25 MG PO TABS
12.5000 mg | ORAL_TABLET | Freq: Two times a day (BID) | ORAL | Status: DC
  Administered 2016-12-22: 12.5 mg via ORAL

## 2016-12-22 MED ORDER — SODIUM CHLORIDE 0.9 % IV BOLUS (SEPSIS)
1000.0000 mL | Freq: Once | INTRAVENOUS | Status: AC
  Administered 2016-12-22: 1000 mL via INTRAVENOUS

## 2016-12-22 MED ORDER — ONDANSETRON 4 MG PO TBDP
ORAL_TABLET | ORAL | Status: AC
  Administered 2016-12-22: 4 mg via ORAL
  Filled 2016-12-22: qty 1

## 2016-12-22 NOTE — Discharge Instructions (Signed)
Please make an appointment to see her primary care physician in the next 1-2 days. Please have him/her  recheck your kidney function as well as your blood sugar, both of which were elevated today. Make sure you drink plenty of fluid to stay well-hydrated. You may take Tylenol or Motrin for your pain. Continue to take deep breaths to prevent pneumonia.  Return to the emergency department if you develop severe pain, falls or fainting, chest pain, vomiting, changes in mental status, or any other symptoms concerning to you.

## 2016-12-22 NOTE — ED Notes (Signed)
Pt provided with sandwich tray and drink.  

## 2016-12-22 NOTE — ED Notes (Signed)

## 2016-12-22 NOTE — ED Notes (Signed)
MD made aware of pt's BP; discharge placed on hold until BP is lowered; medication order received.

## 2016-12-22 NOTE — ED Triage Notes (Signed)
Pt arrived from [facility]. Pt is here today for unwitnessed fall. Pt fell X2 weeks ago and has broken ribs on R side. HX HTN, dementia

## 2016-12-22 NOTE — ED Notes (Signed)
Assisted patient to the restroom.  

## 2016-12-22 NOTE — ED Provider Notes (Addendum)
[facility] Emergency Department Provider Note  ____________________________________________  Time seen: Approximately 5:32 PM  I have reviewed the triage vital signs and the nursing notes.   HISTORY  Chief Complaint Fall  History is limited due to the patient's advanced dementia.  HPI Monica Choi is a 83 y.o. female with advanced dementia and recurrent falls, recent hospitalization for syncope with multiple R sided rib fractures, resenting with right rib and left chest pain after an unwitnessed fall today.The patient does not recall any of the events that occurred today. Per report, the patient was at [facility], when she had an unwitnessed fall. Since then, the patient reports continued right-sided rib pain from her old injury, and new sharp left-sided pains and she is unable to say whether these are worse with deep breaths, or if she fell on her chest wall. She denies any headache, visual changes, numbness tingling or weakness she is not having any shortness of breath, cough or cold symptoms, fever or chills patient has not had any recent nausea vomiting or diarrhea, nor does she know of any medication changes.   Past Medical History:  Diagnosis Date  . Anemia   . Anxiety   . Arrhythmia   . Diabetes mellitus without complication (HCC)   . Diverticulitis   . GERD (gastroesophageal reflux disease)   . Hypertension   . Presence of permanent cardiac pacemaker    hx/notes 11/25/2016  . Rectal prolapse   . Renal disorder   . Rhabdomyolysis 11/2016  . Sick sinus syndrome (HCC)    hx/notes 11/25/2016  . Sinus node dysfunction (HCC)   . TIA (transient ischemic attack) 07/2016   hx/notes 11/25/2016    Patient Active Problem List   Diagnosis Date Noted  . Rhabdomyolysis 11/25/2016  . Nonintractable headache   . TIA (transient ischemic attack) 09/17/2016  . Hypertension 07/17/2016  . Sick sinus syndrome (HCC) 07/17/2016     Past Surgical History:  Procedure Laterality Date  . ABDOMINAL HYSTERECTOMY    . ABDOMINAL SURGERY    . CARDIAC SURGERY    . CATARACT EXTRACTION Bilateral   . CHOLECYSTECTOMY    . COLON SURGERY    . HERNIA REPAIR    . INSERT / REPLACE / REMOVE PACEMAKER     hx/notes 11/25/2016  . KNEE SURGERY    . NASAL SINUS SURGERY      Current Outpatient Rx  . Order #: [id number] Class: Historical Med  . Order #: [id number] Class: Historical Med  . Order #: [id number] Class: Normal  . Order #: [id number] Class: Normal  . Order #: [id number] Class: Historical Med  . Order #: [id number] Class: Historical Med  . Order #: [id number] Class: Historical Med  . Order #: [id number] Class: Print  . Order #: [id number] Class: Historical Med  . Order #: [id number] Class: Historical Med  . Order #: [id number] Class: No Print    Allergies Morphine; Sulfa antibiotics; Amoxicillin; Benzonatate; Buprenorphine; Fish-derived products; Penicillins; Ciprofloxacin; and Morphine and related  Family History  Problem Relation Age of Onset  . Heart attack Father   . Heart attack Brother     Social History Social History  Substance Use Topics  . Smoking status: Former Games developer  . Smokeless tobacco: Never Used  . Alcohol use No    Review of Systems Unable to obtain due to patient dementia. ____________________________________________   PHYSICAL EXAM:  VITAL SIGNS: ED Triage Vitals  Enc Vitals Group     BP 12/22/16 1619 (!) 219/68     Pulse  Rate 12/22/16 1619 63     Resp 12/22/16 1619 18     Temp 12/22/16 1621 (!) 96.9 F (36.1 C)     Temp Source 12/22/16 1621 Axillary     SpO2 12/22/16 1619 97 %     Weight 12/22/16 1620 145 lb (65.8 kg)     Height 12/22/16 1620  (1.702 m)     Head Circumference --      Peak Flow --      Pain Score --      Pain Loc --      Pain Edu? --      Excl. in GC? --     Constitutional: Alert and but not oriented. She answers questions as best as she can, but has no  recollection of today's events.. Chronically ill appearing and occasionally holds both her right and left lateral chest walls. Nontoxic.  Eyes: Conjunctivae are normal.  EOMI. PERRLA. No scleral icterus. No raccoon eyes. Head: Atraumatic. No Battle sign. Nose: No congestion/rhinnorhea. No swelling over the nose or septal hematoma. Mouth/Throat: Mucous membranes are moist. No dental injury or malocclusion. Neck: No stridor.  Supple.  No JVD. No midline C-spine tenderness to palpation, step-offs or deformities. No meningismus. Cardiovascular: Normal rate, regular rhythm. No murmurs, rubs or gallops.  Respiratory: Normal respiratory effort.  No accessory muscle use or retractions. Lungs CTAB.  No wheezes, rales or ronchi. The patient does have reproducible tenderness to palpation over the right lateral chest wall without any ecchymosis, swelling or bruising, no overlying skin abnormalities. On the left side, I am unable to reproduce her pain, including when asking her to take deep breaths. Her skin exam is normal on that side as well. Gastrointestinal: Soft, nontender and nondistended.  No guarding or rebound.  No peritoneal signs. Musculoskeletal: No LE edema. No ttp in the calves or palpable cords.  Negative Homan's sign. No midline T or L-spine tenderness to palpation, step-offs or deformities. There are no evidence of bruising or swelling on the back. Neurologic:  A&Ox3.  Speech is clear.  Face and smile are symmetric.  EOMI.  Moves all extremities well. Skin:  Skin is warm, dry and intact. No rash noted. Psychiatric: Mood and affect are normal.  ____________________________________________   LABS (all labs ordered are listed, but only abnormal results are displayed)  Labs Reviewed  COMPREHENSIVE METABOLIC PANEL - Abnormal; Notable for the following:       Result Value   Glucose, Bld 223 (*)    BUN 30 (*)    Creatinine, Ser 1.22 (*)    GFR calc non Af Amer 41 (*)    GFR calc Af Amer 47 (*)     All other components within normal limits  URINALYSIS, COMPLETE (UACMP) WITH MICROSCOPIC - Abnormal; Notable for the following:    Color, Urine YELLOW (*)    APPearance CLEAR (*)    Protein, ur 100 (*)    Bacteria, UA RARE (*)    Squamous Epithelial / LPF 0-5 (*)    All other components within normal limits  URINE CULTURE  CBC  TROPONIN I   ____________________________________________  EKG  ED ECG REPORT I, Rockne Menghini, the attending physician, personally viewed and interpreted this ECG.   Date: 12/22/2016  EKG Time: 1621  Rate: 65  Rhythm: normal sinus rhythm  Axis: leftward  Intervals:prolonged QTc  ST&T Change: occasional atrial pacing; no STEMI  ____________________________________________  RADIOLOGY  Ct Head Wo Contrast  Result Date: 12/22/2016 CLINICAL DATA:  Altered mental status and fall EXAM: CT HEAD WITHOUT CONTRAST TECHNIQUE: Contiguous axial images were obtained from the base of the skull through the vertex without intravenous contrast. COMPARISON:  11/25/2016 FINDINGS: Brain: Motion degradation of multiple sequences. No hemorrhage or intracranial mass. No large vessel territorial infarct. Moderate subcortical, periventricular and deep white matter small vessel ischemic changes, similar compared to prior. Moderate atrophy. Stable ventricle size Vascular: No hyperdense vessels.  Carotid artery calcification. Skull: No fracture Sinuses/Orbits: No acute finding. Other: None IMPRESSION: 1. Motion degraded study 2. No definite CT evidence for acute intracranial abnormality 3. Atrophy and small vessel ischemic changes of the white matter Electronically Signed   By: Jasmine Pang M.D.   On: 12/22/2016 19:31   Ct Angio Chest Pe W And/or Wo Contrast  Result Date: 12/22/2016 CLINICAL DATA:  Pt arrived from [facility]. Pt is here today for unwitnessed fall. Pt fell X2 weeks ago and has broken ribs on R side. HX HTN, dementia EXAM: CT  ANGIOGRAPHY CHEST WITH CONTRAST TECHNIQUE: Multidetector CT imaging of the chest was performed using the standard protocol during bolus administration of intravenous contrast. Multiplanar CT image reconstructions and MIPs were obtained to evaluate the vascular anatomy. CONTRAST:  75 mL of Isovue 370 intravenous contrast COMPARISON:  Chest radiograph, 11/28/2016 FINDINGS: Cardiovascular: Satisfactory opacification of the pulmonary arteries to the segmental level. No evidence of pulmonary embolism. Heart is mildly enlarged. Three-vessel coronary artery calcifications. 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 Atherosclerosis is noted along the thoracic aorta and at the origin of the branch vessels. No dissection. No aneurysm. Mediastinum/Nodes: No neck base or axillary masses or enlarged lymph nodes. No mediastinal or hilar masses or adenopathy trachea is patent. Esophagus is unremarkable. Lungs/Pleura: Minor dependent subsegmental atelectasis. Mild areas of peripheral interstitial thickening. No evidence of pneumonia or pulmonary edema. No lung mass or suspicious nodule. No pleural effusion or pneumothorax. Upper Abdomen: No acute abnormality. Musculoskeletal: There is subcu name fractures of the right anterior 7 mm eighth and ninth ribs. There is no other fracture of the anterior right second rib that of the subacute. There is a hitting the healed fracture of the left seventh rib in, likely subacute admit the same time as the other fractures. No other fractures.  No osteoblastic or osteolytic lesions. Review of the MIP images confirms the above findings. IMPRESSION: 1. No evidence of a pulmonary embolism. 2. No acute cardiopulmonary abnormality. 3. Mild cardiomegaly and coronary artery calcifications. 4. Aortic atherosclerosis. 5. Multiple subacute rib fractures as described. No apparent acute fractures. Aortic Atherosclerosis (ICD10-I70.0). Electronically Signed   By: Amie Portland M.D.   On: 12/22/2016 19:35    ____________________________________________   PROCEDURES  Procedure(s) performed: None  Procedures  Critical Care performed: No ____________________________________________   INITIAL IMPRESSION / ASSESSMENT AND PLAN / ED COURSE  Pertinent labs & imaging results that were available during my care of the patient were reviewed by me and considered in my medical decision making (see chart for details).  83 y.o. female presenting with an unwitnessed fall with right and left-sided chest pain. Overall, the patient is markedly hypertensive, and I'll treat her with hydralazine for that. I'm concerned that her last fall was also a syncopal episode, and given that she has multiple chest complaints, we'll get a CT scan to evaluate for PE as well as injury. We do a full evaluation for other causes of fall including stroke, abnormal electrolyte, UTI, ACS or MI.  At this time I'm not seeing any ischemic changes on her EKG. Plan reevaluation for final disposition.  ----------------------------------------- 7:57 PM on 12/22/2016 -----------------------------------------  The patient's workup in the emergency department has been reassuring. She does have some mild hyperglycemia with a normal anion gap, and has been treated with fluids. She has bacteria in her urine, but without any nitrates, leukocyte Estrace, or urinary symptoms, the risk benefit of putting this patient on antibiotics is higher than sending a culture; I'll have her follow this up with her primary care physician. The patient CT shows subacute rib fractures and no new injuries and no PE. At this time, the patient is safe for discharge home. Return precautions as well as follow-up instructions were discussed and printed in the paperwork.  ----------------------------------------- 8:46 PM on 12/22/2016 -----------------------------------------  At discharge, the patient is noted to be markedly  hypertensive. She has not had her evening medications, so give her dose at this time and recheck her for final disposition.  ____________________________________________  FINAL CLINICAL IMPRESSION(S) / ED DIAGNOSES  Final diagnoses:  Closed fracture of multiple ribs of right side with routine healing, subsequent encounter  Fall, initial encounter  Left sided chest pain  Bacteriuria  Hyperglycemia         NEW MEDICATIONS STARTED DURING THIS VISIT:  New Prescriptions   No medications on file      Rockne Menghini, MD 12/22/16 1959    Rockne Menghini, MD 12/22/16 2047

## 2016-12-23 ENCOUNTER — Other Ambulatory Visit: Payer: Self-pay

## 2016-12-23 NOTE — Patient Outreach (Signed)
[facility]) Care Management  12/23/2016  Monica Choi 1933/09/13 [id number]   Telephone call to patient for ED utilization screening.  No answer.  HIPAA compliant voice message left.  Plan: RN Health Coach will attempt patient again in the next ten business days.    Bary Leriche, RN, MSN [facility] Care Management RN Telephonic Health Coach [phone]

## 2016-12-24 ENCOUNTER — Other Ambulatory Visit: Payer: Self-pay

## 2016-12-24 LAB — URINE CULTURE

## 2016-12-24 NOTE — Patient Outreach (Signed)
[facility]) Care Management  12/24/2016  Kay Shippy 05-05-1933 [id number]   2nd telephone call to patient for ED utilization screening call.  No answer.  HIPAA compliant voice message left.  Plan: RN Health Coach will attempt patient again within 10 business days.  Bary Leriche, RN, MSN [facility] Care Management RN Telephonic Health Coach [phone]

## 2016-12-25 ENCOUNTER — Ambulatory Visit: Payer: Self-pay

## 2016-12-25 ENCOUNTER — Other Ambulatory Visit: Payer: Self-pay

## 2016-12-25 NOTE — Patient Outreach (Signed)
[facility]) Care Management  12/25/2016  Monica Choi [birth year] [id number]  3rd Telephone call to patient for ED Utilization screen.  No answer.  HIPAA compliant voice message left.  Plan: RN Health Coach will send letter to attempt patient. If no response after ten business days will proceed with case closure.  Bary Leriche, RN, MSN [facility] Care Management RN Telephonic Health Coach [phone]

## 2017-01-12 ENCOUNTER — Other Ambulatory Visit: Payer: Self-pay

## 2017-01-12 NOTE — Patient Outreach (Signed)
[facility]) Care Management  01/12/2017  Monica Choi 1933-06-15 [id number]   Patient has not responded to calls or letter. Will proceed with case closure.  Will notify care management assistant of case status.    Bary Leriche, RN, MSN [facility] Care Management Care Management Telephonic Coordinator [phone]

## 2017-01-13 DIAGNOSIS — I10 Essential (primary) hypertension: Secondary | ICD-10-CM | POA: Diagnosis not present

## 2017-01-13 DIAGNOSIS — M25562 Pain in left knee: Secondary | ICD-10-CM | POA: Diagnosis not present

## 2017-01-13 DIAGNOSIS — Z23 Encounter for immunization: Secondary | ICD-10-CM | POA: Diagnosis not present

## 2017-01-13 DIAGNOSIS — G8929 Other chronic pain: Secondary | ICD-10-CM | POA: Diagnosis not present

## 2017-01-15 ENCOUNTER — Telehealth: Payer: Self-pay | Admitting: Cardiology

## 2017-01-15 ENCOUNTER — Ambulatory Visit (INDEPENDENT_AMBULATORY_CARE_PROVIDER_SITE_OTHER): Payer: PPO | Admitting: *Deleted

## 2017-01-15 DIAGNOSIS — I495 Sick sinus syndrome: Secondary | ICD-10-CM | POA: Diagnosis not present

## 2017-01-15 NOTE — Progress Notes (Signed)
Remote pacemaker transmission.   

## 2017-01-15 NOTE — Telephone Encounter (Signed)
LMOVM reminding pt to send remote transmission.   

## 2017-01-16 ENCOUNTER — Encounter: Payer: Self-pay | Admitting: Cardiology

## 2017-01-18 ENCOUNTER — Emergency Department: Payer: PPO

## 2017-01-18 ENCOUNTER — Emergency Department
Admission: EM | Admit: 2017-01-18 | Discharge: 2017-01-18 | Disposition: A | Discharge status: Home / Self Care | Payer: PPO | Attending: Emergency Medicine | Admitting: Emergency Medicine

## 2017-01-18 DIAGNOSIS — Z7982 Long term (current) use of aspirin: Secondary | ICD-10-CM | POA: Insufficient documentation

## 2017-01-18 DIAGNOSIS — F0390 Unspecified dementia without behavioral disturbance: Secondary | ICD-10-CM | POA: Insufficient documentation

## 2017-01-18 DIAGNOSIS — M6281 Muscle weakness (generalized): Secondary | ICD-10-CM | POA: Diagnosis not present

## 2017-01-18 DIAGNOSIS — R51 Headache: Secondary | ICD-10-CM | POA: Diagnosis not present

## 2017-01-18 DIAGNOSIS — R9431 Abnormal electrocardiogram [ECG] [EKG]: Secondary | ICD-10-CM | POA: Diagnosis not present

## 2017-01-18 DIAGNOSIS — I10 Essential (primary) hypertension: Secondary | ICD-10-CM | POA: Insufficient documentation

## 2017-01-18 DIAGNOSIS — Z87891 Personal history of nicotine dependence: Secondary | ICD-10-CM | POA: Insufficient documentation

## 2017-01-18 DIAGNOSIS — R350 Frequency of micturition: Secondary | ICD-10-CM | POA: Insufficient documentation

## 2017-01-18 DIAGNOSIS — E119 Type 2 diabetes mellitus without complications: Secondary | ICD-10-CM | POA: Insufficient documentation

## 2017-01-18 DIAGNOSIS — Z79899 Other long term (current) drug therapy: Secondary | ICD-10-CM | POA: Insufficient documentation

## 2017-01-18 DIAGNOSIS — R102 Pelvic and perineal pain: Secondary | ICD-10-CM | POA: Diagnosis not present

## 2017-01-18 DIAGNOSIS — Z8673 Personal history of transient ischemic attack (TIA), and cerebral infarction without residual deficits: Secondary | ICD-10-CM | POA: Diagnosis not present

## 2017-01-18 DIAGNOSIS — R410 Disorientation, unspecified: Secondary | ICD-10-CM | POA: Diagnosis not present

## 2017-01-18 DIAGNOSIS — R531 Weakness: Secondary | ICD-10-CM | POA: Diagnosis not present

## 2017-01-18 DIAGNOSIS — R32 Unspecified urinary incontinence: Secondary | ICD-10-CM | POA: Diagnosis present

## 2017-01-18 LAB — URINALYSIS, COMPLETE (UACMP) WITH MICROSCOPIC
BACTERIA UA: NONE SEEN
Bilirubin Urine: NEGATIVE
Glucose, UA: NEGATIVE mg/dL
Hgb urine dipstick: NEGATIVE
Ketones, ur: NEGATIVE mg/dL
Leukocytes, UA: NEGATIVE
Nitrite: NEGATIVE
Protein, ur: 100 mg/dL — AB
RBC / HPF: NONE SEEN RBC/hpf (ref 0–5)
SPECIFIC GRAVITY, URINE: 1.008 (ref 1.005–1.030)
pH: 7 (ref 5.0–8.0)

## 2017-01-18 LAB — BASIC METABOLIC PANEL
Anion gap: 8 (ref 5–15)
BUN: 36 mg/dL — AB (ref 6–20)
CHLORIDE: 106 mmol/L (ref 101–111)
CO2: 26 mmol/L (ref 22–32)
CREATININE: 1.03 mg/dL — AB (ref 0.44–1.00)
Calcium: 9.8 mg/dL (ref 8.9–10.3)
GFR calc Af Amer: 58 mL/min — ABNORMAL LOW (ref >60)
GFR calc non Af Amer: 50 mL/min — ABNORMAL LOW (ref >60)
Glucose, Bld: 160 mg/dL — ABNORMAL HIGH (ref 65–99)
Potassium: 4.3 mmol/L (ref 3.5–5.1)
SODIUM: 140 mmol/L (ref 135–145)

## 2017-01-18 LAB — CBC
HCT: 38.7 % (ref 35.0–47.0)
HEMOGLOBIN: 13.3 g/dL (ref 12.0–16.0)
MCH: 31 pg (ref 26.0–34.0)
MCHC: 34.3 g/dL (ref 32.0–36.0)
MCV: 90.6 fL (ref 80.0–100.0)
Platelets: 188 10*3/uL (ref 150–440)
RBC: 4.27 MIL/uL (ref 3.80–5.20)
RDW: 14 % (ref 11.5–14.5)
WBC: 6.9 10*3/uL (ref 3.6–11.0)

## 2017-01-18 MED ORDER — RAMIPRIL 10 MG PO CAPS
10.0000 mg | ORAL_CAPSULE | ORAL | Status: AC
  Administered 2017-01-18: 10 mg via ORAL
  Filled 2017-01-18: qty 1

## 2017-01-18 MED ORDER — RAMIPRIL 5 MG PO CAPS: 5.0000 mg | ORAL_CAPSULE | Freq: Every day | ORAL | 0 refills | Status: AC

## 2017-01-18 NOTE — ED Notes (Signed)
This RN tried getting lab work x2 times. Called charge nurse for help

## 2017-01-18 NOTE — ED Triage Notes (Signed)
Pt came to ED via [facility] er staff ams. Per granddaughter pt has been incontinent and urinating frequently.

## 2017-01-18 NOTE — ED Provider Notes (Addendum)
[facility] Emergency Department Provider Note   ____________________________________________   First MD Initiated Contact with Patient 01/18/17 1058     (approximate)  I have reviewed the triage vital signs and the nursing notes.   HISTORY  Chief Complaint Altered Mental Status  caveat: Patient with dementia, she cannot recall events  HPI Monica Choi is a 83 y.o. female for evaluation of incontinence  patient's granddaughter is here. He reports over the last week they have noticed that grandmother has been occasionally incontinent of urine. She is also been urinating more frequently. Granddaughter reports went to the doctor's office a couple days ago, and they added new medication for blood pressure called ramipril (I have reviewed chart, patient was started on 10 mg ramipril)  patient denies any pain or discomfort. She does report she remembers that she hurt her ribs a few weeks ago. Denies any trouble breathing. No nausea or vomiting at present, no abdominal pain. Does however report that she feels like she is urinating frequently   Past Medical History:  Diagnosis Date  . Anemia   . Anxiety   . Arrhythmia   . Diabetes mellitus without complication (HCC)   . Diverticulitis   . GERD (gastroesophageal reflux disease)   . Hypertension   . Presence of permanent cardiac pacemaker    hx/notes 11/25/2016  . Rectal prolapse   . Renal disorder   . Rhabdomyolysis 11/2016  . Sick sinus syndrome (HCC)    hx/notes 11/25/2016  . Sinus node dysfunction (HCC)   . TIA (transient ischemic attack) 07/2016   hx/notes 11/25/2016    Patient Active Problem List   Diagnosis Date Noted  . Rhabdomyolysis 11/25/2016  . Nonintractable headache   . TIA (transient ischemic attack) 09/17/2016  . Hypertension 07/17/2016  . Sick sinus syndrome (HCC) 07/17/2016    Past Surgical History:  Procedure Laterality Date  . ABDOMINAL HYSTERECTOMY    . ABDOMINAL SURGERY     . CARDIAC SURGERY    . CATARACT EXTRACTION Bilateral   . CHOLECYSTECTOMY    . COLON SURGERY    . HERNIA REPAIR    . INSERT / REPLACE / REMOVE PACEMAKER     hx/notes 11/25/2016  . KNEE SURGERY    . NASAL SINUS SURGERY      Prior to Admission medications   Medication Sig Start Date End Date Taking? Authorizing Provider  acetaminophen (TYLENOL) 500 MG tablet Take 500 mg by mouth every 8 (eight) hours as needed.   Yes [provider]  aspirin EC 81 MG EC tablet Take 1 tablet (81 mg total) by mouth daily. 09/19/16  Yes Riccio, Angela C, DO  carvedilol (COREG) 12.5 MG tablet Take 1 tablet (12.5 mg total) by mouth 2 (two) times daily. 07/17/16  Yes Camnitz, Andree Coss, MD  celecoxib (CELEBREX) 100 MG capsule Take 100 mg by mouth 2 (two) times daily.   Yes [provider]  Cholecalciferol (VITAMIN D) 2000 units CAPS Take 2,000 Units by mouth daily.    Yes [provider]  DULoxetine (CYMBALTA) 30 MG capsule Take 30 mg by mouth 2 (two) times daily.   Yes [provider]  loratadine (CLARITIN) 10 MG tablet Take 10 mg by mouth daily as needed for allergies.   Yes [provider]  metoCLOPramide (REGLAN) 10 MG tablet Take 1 tablet (10 mg total) by mouth every 6 (six) hours as needed (for nausea/headache). 09/16/16  Yes Molpus, John, MD  nitroGLYCERIN (NITROSTAT) 0.4 MG SL tablet  Place 0.4 mg under the tongue every 5 (five) minutes as needed for chest pain.   Yes [provider]  pantoprazole (PROTONIX) 20 MG tablet Take 20 mg by mouth daily. 04/08/16  Yes [provider]  traMADol (ULTRAM) 50 MG tablet Take 1 tablet (50 mg total) by mouth every 12 (twelve) hours as needed for severe pain. Patient not taking: Reported on 01/18/2017 11/28/16   Marthenia Rolling, DO    Allergies Morphine; Sulfa antibiotics; Amoxicillin; Benzonatate; Buprenorphine; Fish-derived products; Penicillins; Ciprofloxacin; and Morphine and related  Family History  Problem  Relation Age of Onset  . Heart attack Father   . Heart attack Brother     Social History Social History  Substance Use Topics  . Smoking status: Former Games developer  . Smokeless tobacco: Never Used  . Alcohol use No    Review of Systems Constitutional: No fever/chills Eyes: No visual changes. ENT: No sore throat. Cardiovascular: Denies chest pain. Respiratory: Denies shortness of breath. Gastrointestinal: No abdominal pain.  No nausea, no vomiting.  No diarrhea.  No constipation. Genitourinary: history of present illness Musculoskeletal: Negative for back pain.no further falls. Skin: Negative for rash. Neurological: Negative for headaches, focal weakness or numbness.no changes in her baseline confusion at present, but granddaughter does report that a couple days ago they found her wandering into her old apartment at the assisted living, but now she seems to be acting and behaving normally without ongoing confusion.    ____________________________________________   PHYSICAL EXAM:  VITAL SIGNS: ED Triage Vitals [01/18/17 1106]  Enc Vitals Group     BP (!) 203/81     Pulse Rate 60     Resp 18     Temp 97.9 F (36.6 C)     Temp Source Oral     SpO2 98 %     Weight 145 lb (65.8 kg)     Height  (1.626 m)     Head Circumference      Peak Flow      Pain Score      Pain Loc      Pain Edu?      Excl. in GC?     Constitutional: Alert and oriented 2 granddaughter and self but not to date Well appearing and in no acute distress. Eyes: Conjunctivae are normal. Head: Atraumatic. Nose: No congestion/rhinnorhea. Mouth/Throat: Mucous membranes are moist. Neck: No stridor.   Cardiovascular: Normal rate, regular rhythm. Grossly normal heart sounds.  Good peripheral circulation. Respiratory: Normal respiratory effort.  No retractions. Lungs CTAB. Gastrointestinal: Soft and nontender. No distention. Musculoskeletal: No lower extremity tenderness nor edema. Neurologic:  Normal  speech and language. No gross focal neurologic deficits are appreciated.  Skin:  Skin is warm, dry and intact. No rash noted. Psychiatric: Mood and affect are slowly flat, would describe as pleasantly confused and in no distress. Able to hold a conversation but doesn't recall events well ____________________________________________   LABS (all labs ordered are listed, but only abnormal results are displayed)  Labs Reviewed  BASIC METABOLIC PANEL - Abnormal; Notable for the following:       Result Value   Glucose, Bld 160 (*)    BUN 36 (*)    Creatinine, Ser 1.03 (*)    GFR calc non Af Amer 50 (*)    GFR calc Af Amer 58 (*)    All other components within normal limits  URINALYSIS, COMPLETE (UACMP) WITH MICROSCOPIC - Abnormal; Notable for the following:    Color, Urine  STRAW (*)    APPearance CLEAR (*)    Protein, ur 100 (*)    Squamous Epithelial / LPF 0-5 (*)    All other components within normal limits  URINE CULTURE  CBC   ____________________________________________  EKG  reviewed and interpreted by me at 1120 Heart rate 60 Chris 100 QTc 540 Atrial paced rhythm, LVH with repolarization abnormality. Compared with previous EKGs atrial pacing is now present, no new T-wave abnormalities noted ____________________________________________  RADIOLOGY  chest x-ray and CT of the head reviewed without acute ____________________________________________   PROCEDURES  Procedure(s) performed: None  Procedures  Critical Care performed: No  ____________________________________________   INITIAL IMPRESSION / ASSESSMENT AND PLAN / ED COURSE  Pertinent labs & imaging results that were available during my care of the patient were reviewed by me and considered in my medical decision making (see chart for details).  incontinence. Family reports increasing incontinence over the last 1 week and increased urinary frequency. Had a recent urine culture did not grow specific  organism. Also family reports change in urine odor. Suspicious for possible urinary tract infection, we'll resend urinalysis. Patient otherwise appears to be at her baseline at present. Given her recent history of elevating creatinine I will check basic labs as well. No clear indication for head CT at this time. No obvious evidence of any trauma or injury. Patient not complaining of any cardiac or pulmonary symptoms. Blood pressure is elevated, but confirmed with the facility patient had not been given her ramipril which was prescribed to her just a couple days ago. We will give this here  Clinical Course as of Jan 19 1339  Sun Jan 18, 2017  1306 Creatinine: (!) 1.03 [MQ]  1307 GFR, Est Non African American: (!) 50 [MQ]  1307 WBC: 6.9 [MQ]  1307 Hemoglobin: 13.3 [MQ]  1307 CT Head Wo Contrast [MQ]  1307 DG Chest 2 View [MQ]    Clinical Course User Index [MQ] Sharyn Creamer, MD   ----------------------------------------- 12:13 PM on 01/18/2017 ----------------------------------------- Patient's urinalysis completely normal. Blood pressure is now improved to 170 systolic. Given urinalysis is normal, slightly unclear as to why the patient would have increased urination. Await labs and will add CT of the head given the report of confusion occurring in the last day or 2 which is now resolved, as well as a chest x-ray to follow make shows no pneumonia or other concerns after multiple rib fractures recently. Family updated at bedside  ----------------------------------------- 1:40 PM on 01/18/2017 -----------------------------------------  CT and chest x-ray are reassuring. No clear source to explain the patient's increased frequency noticed this time. No abdominal pain, reassuring labs I do not believe CT is warranted on an emergent basis at this time.blood pressure improved to 171/51 after ramipril.  Discussed with the patient's granddaughter, she is providing care as the patient's father is  currently in [place] and cannot be reached. Granddaughter will be taking patient back to her assisted facility.  Return precautions and treatment recommendations and follow-up discussed with the patient's family who is agreeable with the plan.   ____________________________________________   FINAL CLINICAL IMPRESSION(S) / ED DIAGNOSES  Final diagnoses:  Urination frequency  Dementia without behavioral disturbance, unspecified dementia type      NEW MEDICATIONS STARTED DURING THIS VISIT:  New Prescriptions   No medications on file     Note:  This document was prepared using Dragon voice recognition software and may include unintentional dictation errors.     Sharyn Creamer, MD 01/18/17 1341  Sharyn Creamer, MD 01/18/17 1341

## 2017-01-19 LAB — URINE CULTURE

## 2017-01-28 DIAGNOSIS — E118 Type 2 diabetes mellitus with unspecified complications: Secondary | ICD-10-CM | POA: Diagnosis not present

## 2017-02-03 LAB — CUP PACEART REMOTE DEVICE CHECK
Battery Remaining Longevity: 131 mo
Battery Remaining Percentage: 95.5 %
Battery Voltage: 3.01 V
Brady Statistic AP VS Percent: 15 %
Brady Statistic AS VS Percent: 85 %
Brady Statistic RV Percent Paced: 1 %
Date Time Interrogation Session: 20181011153538
Implantable Lead Implant Date: 20160108
Implantable Lead Location: 753862
Implantable Lead Location: 753862
Implantable Lead Model: 1948
Lead Channel Impedance Value: 360 Ohm
Lead Channel Pacing Threshold Amplitude: 0.5 V
Lead Channel Pacing Threshold Amplitude: 1.125 V
Lead Channel Pacing Threshold Pulse Width: 0.4 ms
Lead Channel Sensing Intrinsic Amplitude: 5 mV
Lead Channel Setting Pacing Pulse Width: 0.4 ms
Lead Channel Setting Sensing Sensitivity: 2 mV
MDC IDC LEAD IMPLANT DT: 20160108
MDC IDC MSMT LEADCHNL RV IMPEDANCE VALUE: 510 Ohm
MDC IDC MSMT LEADCHNL RV PACING THRESHOLD PULSEWIDTH: 0.4 ms
MDC IDC MSMT LEADCHNL RV SENSING INTR AMPL: 8.6 mV
MDC IDC PG IMPLANT DT: 20160108
MDC IDC SET LEADCHNL RA PACING AMPLITUDE: 1.5 V
MDC IDC SET LEADCHNL RV PACING AMPLITUDE: 1.375
MDC IDC STAT BRADY AP VP PERCENT: 1 %
MDC IDC STAT BRADY AS VP PERCENT: 1 %
MDC IDC STAT BRADY RA PERCENT PACED: 14 %
Pulse Gen Serial Number: 7661751

## 2017-03-02 ENCOUNTER — Encounter: Payer: Self-pay | Admitting: Emergency Medicine

## 2017-03-02 ENCOUNTER — Emergency Department: Payer: PPO

## 2017-03-02 ENCOUNTER — Inpatient Hospital Stay
Admission: EM | Admit: 2017-03-02 | Discharge: 2017-03-04 | DRG: 640 | Disposition: A | Discharge status: Home / Self Care | Payer: PPO | Attending: Internal Medicine | Admitting: Internal Medicine

## 2017-03-02 ENCOUNTER — Inpatient Hospital Stay: Payer: PPO

## 2017-03-02 ENCOUNTER — Other Ambulatory Visit: Payer: Self-pay

## 2017-03-02 DIAGNOSIS — K219 Gastro-esophageal reflux disease without esophagitis: Secondary | ICD-10-CM | POA: Diagnosis present

## 2017-03-02 DIAGNOSIS — E785 Hyperlipidemia, unspecified: Secondary | ICD-10-CM | POA: Diagnosis not present

## 2017-03-02 DIAGNOSIS — G9341 Metabolic encephalopathy: Secondary | ICD-10-CM | POA: Diagnosis present

## 2017-03-02 DIAGNOSIS — R109 Unspecified abdominal pain: Secondary | ICD-10-CM | POA: Diagnosis not present

## 2017-03-02 DIAGNOSIS — F0390 Unspecified dementia without behavioral disturbance: Secondary | ICD-10-CM | POA: Diagnosis not present

## 2017-03-02 DIAGNOSIS — R410 Disorientation, unspecified: Secondary | ICD-10-CM | POA: Diagnosis not present

## 2017-03-02 DIAGNOSIS — N183 Chronic kidney disease, stage 3 (moderate): Secondary | ICD-10-CM | POA: Diagnosis present

## 2017-03-02 DIAGNOSIS — Z950 Presence of cardiac pacemaker: Secondary | ICD-10-CM

## 2017-03-02 DIAGNOSIS — Z880 Allergy status to penicillin: Secondary | ICD-10-CM | POA: Diagnosis not present

## 2017-03-02 DIAGNOSIS — I495 Sick sinus syndrome: Secondary | ICD-10-CM | POA: Diagnosis not present

## 2017-03-02 DIAGNOSIS — Z885 Allergy status to narcotic agent status: Secondary | ICD-10-CM | POA: Diagnosis not present

## 2017-03-02 DIAGNOSIS — Z7984 Long term (current) use of oral hypoglycemic drugs: Secondary | ICD-10-CM | POA: Diagnosis not present

## 2017-03-02 DIAGNOSIS — A084 Viral intestinal infection, unspecified: Secondary | ICD-10-CM | POA: Diagnosis not present

## 2017-03-02 DIAGNOSIS — Z87891 Personal history of nicotine dependence: Secondary | ICD-10-CM | POA: Diagnosis not present

## 2017-03-02 DIAGNOSIS — E1122 Type 2 diabetes mellitus with diabetic chronic kidney disease: Secondary | ICD-10-CM | POA: Diagnosis not present

## 2017-03-02 DIAGNOSIS — R197 Diarrhea, unspecified: Secondary | ICD-10-CM

## 2017-03-02 DIAGNOSIS — Z7982 Long term (current) use of aspirin: Secondary | ICD-10-CM | POA: Diagnosis not present

## 2017-03-02 DIAGNOSIS — Z79899 Other long term (current) drug therapy: Secondary | ICD-10-CM

## 2017-03-02 DIAGNOSIS — I6523 Occlusion and stenosis of bilateral carotid arteries: Secondary | ICD-10-CM | POA: Diagnosis not present

## 2017-03-02 DIAGNOSIS — E860 Dehydration: Secondary | ICD-10-CM

## 2017-03-02 DIAGNOSIS — R29810 Facial weakness: Secondary | ICD-10-CM | POA: Diagnosis not present

## 2017-03-02 DIAGNOSIS — I248 Other forms of acute ischemic heart disease: Secondary | ICD-10-CM | POA: Diagnosis not present

## 2017-03-02 DIAGNOSIS — Z888 Allergy status to other drugs, medicaments and biological substances status: Secondary | ICD-10-CM

## 2017-03-02 DIAGNOSIS — N179 Acute kidney failure, unspecified: Secondary | ICD-10-CM | POA: Diagnosis not present

## 2017-03-02 DIAGNOSIS — Z882 Allergy status to sulfonamides status: Secondary | ICD-10-CM

## 2017-03-02 DIAGNOSIS — R4020 Unspecified coma: Secondary | ICD-10-CM | POA: Diagnosis not present

## 2017-03-02 DIAGNOSIS — I129 Hypertensive chronic kidney disease with stage 1 through stage 4 chronic kidney disease, or unspecified chronic kidney disease: Secondary | ICD-10-CM | POA: Diagnosis not present

## 2017-03-02 DIAGNOSIS — G9340 Encephalopathy, unspecified: Secondary | ICD-10-CM | POA: Diagnosis not present

## 2017-03-02 DIAGNOSIS — Z881 Allergy status to other antibiotic agents status: Secondary | ICD-10-CM | POA: Diagnosis not present

## 2017-03-02 DIAGNOSIS — R4182 Altered mental status, unspecified: Secondary | ICD-10-CM | POA: Diagnosis not present

## 2017-03-02 DIAGNOSIS — Z791 Long term (current) use of non-steroidal anti-inflammatories (NSAID): Secondary | ICD-10-CM

## 2017-03-02 DIAGNOSIS — Z8673 Personal history of transient ischemic attack (TIA), and cerebral infarction without residual deficits: Secondary | ICD-10-CM | POA: Diagnosis not present

## 2017-03-02 DIAGNOSIS — E875 Hyperkalemia: Secondary | ICD-10-CM | POA: Diagnosis not present

## 2017-03-02 DIAGNOSIS — I361 Nonrheumatic tricuspid (valve) insufficiency: Secondary | ICD-10-CM | POA: Diagnosis not present

## 2017-03-02 DIAGNOSIS — F05 Delirium due to known physiological condition: Secondary | ICD-10-CM | POA: Diagnosis not present

## 2017-03-02 LAB — URINALYSIS, ROUTINE W REFLEX MICROSCOPIC
Bacteria, UA: NONE SEEN
Bilirubin Urine: NEGATIVE
GLUCOSE, UA: NEGATIVE mg/dL
HGB URINE DIPSTICK: NEGATIVE
Ketones, ur: NEGATIVE mg/dL
Leukocytes, UA: NEGATIVE
Nitrite: NEGATIVE
PROTEIN: 100 mg/dL — AB
Specific Gravity, Urine: 1.011 (ref 1.005–1.030)
pH: 5 (ref 5.0–8.0)

## 2017-03-02 LAB — CBC
HEMATOCRIT: 40.5 % (ref 35.0–47.0)
HEMOGLOBIN: 13.5 g/dL (ref 12.0–16.0)
MCH: 31 pg (ref 26.0–34.0)
MCHC: 33.4 g/dL (ref 32.0–36.0)
MCV: 92.8 fL (ref 80.0–100.0)
Platelets: 197 10*3/uL (ref 150–440)
RBC: 4.36 MIL/uL (ref 3.80–5.20)
RDW: 13 % (ref 11.5–14.5)
WBC: 9.3 10*3/uL (ref 3.6–11.0)

## 2017-03-02 LAB — COMPREHENSIVE METABOLIC PANEL
ALT: 16 U/L (ref 14–54)
ANION GAP: 10 (ref 5–15)
AST: 30 U/L (ref 15–41)
Albumin: 4.2 g/dL (ref 3.5–5.0)
Alkaline Phosphatase: 86 U/L (ref 38–126)
BILIRUBIN TOTAL: 1 mg/dL (ref 0.3–1.2)
BUN: 39 mg/dL — AB (ref 6–20)
CO2: 17 mmol/L — ABNORMAL LOW (ref 22–32)
Calcium: 9.8 mg/dL (ref 8.9–10.3)
Chloride: 110 mmol/L (ref 101–111)
Creatinine, Ser: 1.59 mg/dL — ABNORMAL HIGH (ref 0.44–1.00)
GFR, EST AFRICAN AMERICAN: 34 mL/min — AB (ref >60)
GFR, EST NON AFRICAN AMERICAN: 30 mL/min — AB (ref >60)
Glucose, Bld: 151 mg/dL — ABNORMAL HIGH (ref 65–99)
POTASSIUM: 6 mmol/L — AB (ref 3.5–5.1)
Sodium: 137 mmol/L (ref 135–145)
TOTAL PROTEIN: 7.5 g/dL (ref 6.5–8.1)

## 2017-03-02 LAB — GLUCOSE, CAPILLARY
GLUCOSE-CAPILLARY: 126 mg/dL — AB (ref 65–99)
Glucose-Capillary: 161 mg/dL — ABNORMAL HIGH (ref 65–99)

## 2017-03-02 LAB — PROTIME-INR
INR: 0.93
Prothrombin Time: 12.4 seconds (ref 11.4–15.2)

## 2017-03-02 LAB — URINE DRUG SCREEN, QUALITATIVE (ARMC ONLY)
AMPHETAMINES, UR SCREEN: NOT DETECTED
BENZODIAZEPINE, UR SCRN: NOT DETECTED
Barbiturates, Ur Screen: NOT DETECTED
Cannabinoid 50 Ng, Ur ~~LOC~~: NOT DETECTED
Cocaine Metabolite,Ur ~~LOC~~: NOT DETECTED
MDMA (Ecstasy)Ur Screen: NOT DETECTED
METHADONE SCREEN, URINE: NOT DETECTED
Opiate, Ur Screen: NOT DETECTED
PHENCYCLIDINE (PCP) UR S: NOT DETECTED
Tricyclic, Ur Screen: NOT DETECTED

## 2017-03-02 LAB — DIFFERENTIAL
Basophils Absolute: 0.1 10*3/uL (ref 0–0.1)
Basophils Relative: 1 %
EOS ABS: 0.1 10*3/uL (ref 0–0.7)
EOS PCT: 2 %
LYMPHS ABS: 1.9 10*3/uL (ref 1.0–3.6)
Lymphocytes Relative: 20 %
MONOS PCT: 4 %
Monocytes Absolute: 0.4 10*3/uL (ref 0.2–0.9)
Neutro Abs: 6.9 10*3/uL — ABNORMAL HIGH (ref 1.4–6.5)
Neutrophils Relative %: 73 %

## 2017-03-02 LAB — POTASSIUM: Potassium: 4.5 mmol/L (ref 3.5–5.1)

## 2017-03-02 LAB — TROPONIN I: Troponin I: 0.03 ng/mL (ref <0.03)

## 2017-03-02 LAB — ETHANOL: Alcohol, Ethyl (B): <10 mg/dL (ref <10)

## 2017-03-02 LAB — APTT: APTT: 27 s (ref 24–36)

## 2017-03-02 LAB — MRSA PCR SCREENING: MRSA BY PCR: NEGATIVE

## 2017-03-02 LAB — AMMONIA: Ammonia: 27 umol/L (ref 9–35)

## 2017-03-02 MED ORDER — ACETAMINOPHEN 650 MG RE SUPP: 650.0000 mg | RECTAL | Status: DC | PRN

## 2017-03-02 MED ORDER — ATORVASTATIN CALCIUM 20 MG PO TABS
20.0000 mg | ORAL_TABLET | Freq: Every day | ORAL | Status: DC
  Administered 2017-03-03: 18:00:00 20 mg via ORAL
  Filled 2017-03-02: qty 1

## 2017-03-02 MED ORDER — PNEUMOCOCCAL VAC POLYVALENT 25 MCG/0.5ML IJ INJ: 0.5000 mL | INJECTION | INTRAMUSCULAR | Status: DC

## 2017-03-02 MED ORDER — CARVEDILOL 12.5 MG PO TABS
12.5000 mg | ORAL_TABLET | Freq: Two times a day (BID) | ORAL | Status: DC
  Administered 2017-03-03 – 2017-03-04 (×3): 12.5 mg via ORAL
  Filled 2017-03-02: qty 4
  Filled 2017-03-02 (×5): qty 1

## 2017-03-02 MED ORDER — RAMIPRIL 5 MG PO CAPS
5.0000 mg | ORAL_CAPSULE | Freq: Every day | ORAL | Status: DC
  Filled 2017-03-02: qty 1

## 2017-03-02 MED ORDER — ASPIRIN EC 81 MG PO TBEC
81.0000 mg | DELAYED_RELEASE_TABLET | Freq: Every day | ORAL | Status: DC
  Administered 2017-03-03 – 2017-03-04 (×2): 81 mg via ORAL
  Filled 2017-03-02 (×2): qty 1

## 2017-03-02 MED ORDER — INSULIN ASPART 100 UNIT/ML ~~LOC~~ SOLN: 0.0000 [IU] | Freq: Every day | SUBCUTANEOUS | Status: DC

## 2017-03-02 MED ORDER — LORAZEPAM 2 MG/ML IJ SOLN
1.0000 mg | Freq: Once | INTRAMUSCULAR | Status: AC
  Administered 2017-03-02: 1 mg via INTRAVENOUS

## 2017-03-02 MED ORDER — ACETAMINOPHEN 160 MG/5ML PO SOLN: 650.0000 mg | ORAL | Status: DC | PRN

## 2017-03-02 MED ORDER — ACETAMINOPHEN 325 MG PO TABS: 650.0000 mg | ORAL_TABLET | ORAL | Status: DC | PRN

## 2017-03-02 MED ORDER — SODIUM CHLORIDE 0.45 % IV SOLN
INTRAVENOUS | Status: DC
  Administered 2017-03-03 (×2): via INTRAVENOUS

## 2017-03-02 MED ORDER — LORAZEPAM 2 MG/ML IJ SOLN
INTRAMUSCULAR | Status: AC
  Administered 2017-03-02: 1 mg via INTRAVENOUS
  Filled 2017-03-02: qty 1

## 2017-03-02 MED ORDER — LORAZEPAM 2 MG/ML IJ SOLN
INTRAMUSCULAR | Status: AC
  Filled 2017-03-02: qty 1

## 2017-03-02 MED ORDER — HEPARIN SODIUM (PORCINE) 5000 UNIT/ML IJ SOLN
5000.0000 [IU] | Freq: Three times a day (TID) | INTRAMUSCULAR | Status: DC
  Administered 2017-03-03 – 2017-03-04 (×3): 5000 [IU] via SUBCUTANEOUS
  Filled 2017-03-02 (×3): qty 1

## 2017-03-02 MED ORDER — ONDANSETRON HCL 4 MG/2ML IJ SOLN
INTRAMUSCULAR | Status: AC
  Filled 2017-03-02: qty 2

## 2017-03-02 MED ORDER — VITAMIN D 1000 UNITS PO TABS
2000.0000 [IU] | ORAL_TABLET | Freq: Every day | ORAL | Status: DC
  Administered 2017-03-04: 10:00:00 2000 [IU] via ORAL
  Filled 2017-03-02 (×2): qty 2

## 2017-03-02 MED ORDER — INSULIN ASPART 100 UNIT/ML ~~LOC~~ SOLN
0.0000 [IU] | Freq: Three times a day (TID) | SUBCUTANEOUS | Status: DC
  Administered 2017-03-02: 3 [IU] via SUBCUTANEOUS
  Administered 2017-03-03 – 2017-03-04 (×3): 2 [IU] via SUBCUTANEOUS
  Filled 2017-03-02 (×4): qty 1

## 2017-03-02 MED ORDER — LORAZEPAM 2 MG/ML IJ SOLN
1.0000 mg | Freq: Once | INTRAMUSCULAR | Status: AC
  Administered 2017-03-02: 1 mg via INTRAMUSCULAR

## 2017-03-02 MED ORDER — ONDANSETRON HCL 4 MG/2ML IJ SOLN
4.0000 mg | Freq: Once | INTRAMUSCULAR | Status: AC
  Administered 2017-03-02: 4 mg via INTRAVENOUS

## 2017-03-02 MED ORDER — HALOPERIDOL 5 MG PO TABS
5.0000 mg | ORAL_TABLET | ORAL | Status: DC | PRN
  Filled 2017-03-02: qty 1

## 2017-03-02 MED ORDER — PANTOPRAZOLE SODIUM 40 MG PO TBEC
40.0000 mg | DELAYED_RELEASE_TABLET | Freq: Every day | ORAL | Status: DC
  Administered 2017-03-03 – 2017-03-04 (×2): 40 mg via ORAL
  Filled 2017-03-02 (×2): qty 1

## 2017-03-02 MED ORDER — NITROGLYCERIN 0.4 MG SL SUBL: 0.4000 mg | SUBLINGUAL_TABLET | SUBLINGUAL | Status: DC | PRN

## 2017-03-02 MED ORDER — HALOPERIDOL LACTATE 5 MG/ML IJ SOLN
5.0000 mg | INTRAMUSCULAR | Status: DC | PRN
  Administered 2017-03-02: 5 mg via INTRAMUSCULAR
  Filled 2017-03-02: qty 1

## 2017-03-02 MED ORDER — HALOPERIDOL LACTATE 5 MG/ML IJ SOLN
2.0000 mg | Freq: Once | INTRAMUSCULAR | Status: AC
  Administered 2017-03-02: 2 mg via INTRAVENOUS

## 2017-03-02 MED ORDER — FLORANEX PO PACK
1.0000 g | PACK | Freq: Three times a day (TID) | ORAL | Status: DC
  Administered 2017-03-03 – 2017-03-04 (×4): 1 g via ORAL
  Filled 2017-03-02 (×7): qty 1

## 2017-03-02 MED ORDER — LORATADINE 10 MG PO TABS: 10.0000 mg | ORAL_TABLET | Freq: Every day | ORAL | Status: DC | PRN

## 2017-03-02 MED ORDER — HYDRALAZINE HCL 20 MG/ML IJ SOLN: 10.0000 mg | INTRAMUSCULAR | Status: DC | PRN

## 2017-03-02 MED ORDER — HALOPERIDOL LACTATE 5 MG/ML IJ SOLN
INTRAMUSCULAR | Status: AC
  Administered 2017-03-02: 2 mg via INTRAVENOUS
  Filled 2017-03-02: qty 1

## 2017-03-02 MED ORDER — STROKE: EARLY STAGES OF RECOVERY BOOK
Freq: Once | Status: AC
  Administered 2017-03-02: 17:00:00

## 2017-03-02 NOTE — ED Notes (Signed)
Pt linens changed, brief changed. Skin tear covered. Family at bedside.

## 2017-03-02 NOTE — ED Notes (Signed)
Attempt to call report unsuccessful.

## 2017-03-02 NOTE — ED Notes (Signed)
Report to Diedra, RN  

## 2017-03-02 NOTE — ED Notes (Signed)
Patient transported to CT 

## 2017-03-02 NOTE — ED Notes (Addendum)
Pt back from CT, yelling in hallway. When patient into room, pt goes back to sleep. VSS at this time. Daughter at bedside. Will hold haldol at this time, since patient is resting comfortably. EDP aware.

## 2017-03-02 NOTE — ED Notes (Addendum)
Pt dry when leaving ER.  Taken to floor by Designer, television/film setN and sitter at bedside. All of belongings since with patient.

## 2017-03-02 NOTE — ED Notes (Signed)
Attempting to verbally deescalate patient unsuccessful. Family at bedside. Seizure pads applied to side rails to protect patient. Pt has skin tear to left forearm from throwing arms against side rails.

## 2017-03-02 NOTE — ED Notes (Addendum)
Pt screaming, trying to get out of bed. Granddaughter at bedside trying to calm patient. Pt continues to scream non formed words. EDP aware. Verbal orders received.

## 2017-03-02 NOTE — ED Triage Notes (Addendum)
[facility] with c/o increased confusion and diarrhea . Pt c/o abd pain, pt being uncooperative and tearful. A&O to self

## 2017-03-02 NOTE — ED Provider Notes (Signed)
[facility] Emergency Department Provider Note   ____________________________________________    I have reviewed the triage vital signs and the nursing notes.   HISTORY  Chief Complaint Altered Mental Status and Abdominal Pain  History limited by altered mental status   HPI Monica Choi is a 83 y.o. female who presents with altered mental status.  Apparently at baseline the patient is demented but is calm and is able to carry on conversation.  Today she was found throwing stool and very confused.  No reports of fevers.  Was fine several days ago at things getting dinner.  Granddaughter reports this is very unusual and has not happened before.  No history of behavioral disturbances with dementia   Past Medical History:  Diagnosis Date  . Anemia   . Anxiety   . Arrhythmia   . Diabetes mellitus without complication (HCC)   . Diverticulitis   . GERD (gastroesophageal reflux disease)   . Hypertension   . Presence of permanent cardiac pacemaker    hx/notes 11/25/2016  . Rectal prolapse   . Renal disorder   . Rhabdomyolysis 11/2016  . Sick sinus syndrome (HCC)    hx/notes 11/25/2016  . Sinus node dysfunction (HCC)   . TIA (transient ischemic attack) 07/2016   hx/notes 11/25/2016    Patient Active Problem List   Diagnosis Date Noted  . Encephalopathy 03/02/2017  . Rhabdomyolysis 11/25/2016  . Nonintractable headache   . TIA (transient ischemic attack) 09/17/2016  . Hypertension 07/17/2016  . Sick sinus syndrome (HCC) 07/17/2016    Past Surgical History:  Procedure Laterality Date  . ABDOMINAL HYSTERECTOMY    . ABDOMINAL SURGERY    . CARDIAC SURGERY    . CATARACT EXTRACTION Bilateral   . CHOLECYSTECTOMY    . COLON SURGERY    . HERNIA REPAIR    . INSERT / REPLACE / REMOVE PACEMAKER     hx/notes 11/25/2016  . KNEE SURGERY    . NASAL SINUS SURGERY      Prior to Admission medications   Medication Sig Start Date End Date Taking?  Authorizing Provider  acetaminophen (TYLENOL) 500 MG tablet Take 500 mg by mouth every 8 (eight) hours as needed.   Yes [provider]  aspirin EC 81 MG EC tablet Take 1 tablet (81 mg total) by mouth daily. 09/19/16  Yes Riccio, Angela C, DO  carvedilol (COREG) 12.5 MG tablet Take 1 tablet (12.5 mg total) by mouth 2 (two) times daily. 07/17/16  Yes Camnitz, Andree CossWill Martin, MD  celecoxib (CELEBREX) 100 MG capsule Take 100 mg by mouth 2 (two) times daily.   Yes [provider]  Cholecalciferol (VITAMIN D) 2000 units CAPS Take 2,000 Units by mouth daily.    Yes [provider]  DULoxetine (CYMBALTA) 30 MG capsule Take 30 mg by mouth 2 (two) times daily.   Yes [provider]  metFORMIN (GLUCOPHAGE-XR) 500 MG 24 hr tablet Take 500 mg by mouth daily with breakfast.   Yes [provider]  pantoprazole (PROTONIX) 20 MG tablet Take 20 mg by mouth daily. 04/08/16  Yes [provider]  ramipril (ALTACE) 5 MG capsule Take 1 capsule (5 mg total) by mouth daily. Patient taking differently: Take 10 mg by mouth daily.  01/19/17  Yes Sharyn CreamerQuale, Mark, MD  loratadine (CLARITIN) 10 MG tablet Take 10 mg by mouth daily as needed for allergies.    [provider]  metoCLOPramide (REGLAN) 10 MG tablet Take 1 tablet (10 mg  total) by mouth every 6 (six) hours as needed (for nausea/headache). 09/16/16   Molpus, John, MD  nitroGLYCERIN (NITROSTAT) 0.4 MG SL tablet Place 0.4 mg under the tongue every 5 (five) minutes as needed for chest pain.    [provider]  traMADol (ULTRAM) 50 MG tablet Take 1 tablet (50 mg total) by mouth every 12 (twelve) hours as needed for severe pain. Patient not taking: Reported on 01/18/2017 11/28/16   Marthenia Rolling, DO     Allergies Morphine; Sulfa antibiotics; Amoxicillin; Benzonatate; Buprenorphine; Fish-derived products; Penicillins; Ciprofloxacin; and Morphine and related  Family History  Problem Relation Age of Onset  . Heart  attack Father   . Heart attack Brother     Social History Social History   Tobacco Use  . Smoking status: Former Games developer  . Smokeless tobacco: Never Used  Substance Use Topics  . Alcohol use: No  . Drug use: No    Level 5 caveat: Unable to obtain review of Systems due to altered mental status     ____________________________________________   PHYSICAL EXAM:  VITAL SIGNS: ED Triage Vitals  Enc Vitals Group     BP 03/02/17 1100 (!) 165/103     Pulse Rate 03/02/17 1025 71     Resp 03/02/17 1025 (!) 22     Temp 03/02/17 1025 97.9 F (36.6 C)     Temp Source 03/02/17 1025 Oral     SpO2 03/02/17 1025 94 %     Weight --      Height --      Head Circumference --      Peak Flow --      Pain Score --      Pain Loc --      Pain Edu? --      Excl. in GC? --     Constitutional: Alert.  Anxious Eyes: Conjunctivae are normal.  Head: Atraumatic. Nose: No congestion/rhinnorhea. Mouth/Throat: Mucous membranes are moist.    Cardiovascular: Normal rate, regular rhythm. Grossly normal heart sounds.  Good peripheral circulation. Respiratory: Normal respiratory effort.  No retractions. Lungs CTAB. Gastrointestinal: Soft and nontender. No distention.  No CVA tenderness.  Musculoskeletal:   Warm and well perfused Neurologic:  Normal speech and language.  Left facial droop, extremities with normal strength full range of motion Skin:  Skin is warm, dry and intact. No rash noted. Psychiatric: Quite anxious and occasionally crying out for unclear reasons  ____________________________________________   LABS (all labs ordered are listed, but only abnormal results are displayed)  Labs Reviewed  DIFFERENTIAL - Abnormal; Notable for the following components:      Result Value   Neutro Abs 6.9 (*)    All other components within normal limits  COMPREHENSIVE METABOLIC PANEL - Abnormal; Notable for the following components:   Potassium 6.0 (*)    CO2 17 (*)    Glucose, Bld 151 (*)      BUN 39 (*)    Creatinine, Ser 1.59 (*)    GFR calc non Af Amer 30 (*)    GFR calc Af Amer 34 (*)    All other components within normal limits  URINALYSIS, ROUTINE W REFLEX MICROSCOPIC - Abnormal; Notable for the following components:   Color, Urine STRAW (*)    APPearance CLEAR (*)    Protein, ur 100 (*)    Squamous Epithelial / LPF 0-5 (*)    All other components within normal limits  URINE CULTURE  CBC  TROPONIN I  URINE DRUG  SCREEN, QUALITATIVE ([facility] ONLY)  ETHANOL  PROTIME-INR  APTT  TROPONIN I  TROPONIN I  TROPONIN I  RPR  AMMONIA   ____________________________________________  EKG  ED ECG REPORT I, Jene EveryKINNER, Umer Harig, the attending physician, personally viewed and interpreted this ECG.  Date: 03/02/2017  Rhythm: normal sinus rhythm QRS Axis: normal Intervals: normal ST/T Wave abnormalities: normal Narrative Interpretation: no evidence of acute ischemia  ____________________________________________  RADIOLOGY  Ct head unremarkable ____________________________________________   PROCEDURES  Procedure(s) performed: No  Procedures   Critical Care performed: No ____________________________________________   INITIAL IMPRESSION / ASSESSMENT AND PLAN / ED COURSE  Pertinent labs & imaging results that were available during my care of the patient were reviewed by me and considered in my medical decision making (see chart for details).  Patient presents with altered mental status of unclear etiology.  Lab work is overall unremarkable.  CT head is unremarkable.  Ativan and Haldol given to keep the patient calm she was a danger to herself.  Discussed with the hospitalist for admission to further evaluate because of her likely delirium    ____________________________________________   FINAL CLINICAL IMPRESSION(S) / ED DIAGNOSES  Final diagnoses:  Dehydration  Diarrhea, unspecified type  Delirium        Note:  This document was prepared using  Dragon voice recognition software and may include unintentional dictation errors.    Jene EveryKinner, Ridley Schewe, MD 03/02/17 [phone]

## 2017-03-02 NOTE — ED Notes (Signed)
Unable to take report X 1

## 2017-03-02 NOTE — H&P (Signed)
[employer] [facility] [place] [facility]   PATIENT NAME: Monica Choi    MR#:  [id number]  DATE OF BIRTH:  20-May-1933  DATE OF ADMISSION:  03/02/2017  PRIMARY CARE PHYSICIAN: Macy Mis, MD   REQUESTING/REFERRING PHYSICIAN:   CHIEF COMPLAINT:   Chief Complaint  Patient presents with  . Altered Mental Status  . Abdominal Pain    HISTORY OF PRESENT ILLNESS: Monica Choi  is a 83 y.o. female with a known history of dementia, chronic kidney disease stage III, pacemaker for sick sinus syndrome, presenting from assisted living facility with acute confusion noted earlier this morning, patient was agitated/combative/yelling, also had associated diarrhea to facility, brought to the emergency room noted facial asymmetry/left facial droop on exam, patient requiring sedation to obtain CT scan of the head which was negative for any acute process, potassium was 6, creatinine 1.5 with baseline 1, urinalysis/urine drug screen negative, EKG with T wave inversion far laterally, patient evaluated in the emergency room status post sedation therapy, currently in restraints, family at the bedside, patient with noted left nasolabial fold flattening, not following commands, patient is poor historian due to encephalopathy, patient now being admitted for acute encephalopathy with facial asymmetry, acute diarrhea, acute kidney injury with chronic kidney disease stage III, and hyperkalemia.  PAST MEDICAL HISTORY:   Past Medical History:  Diagnosis Date  . Anemia   . Anxiety   . Arrhythmia   . Diabetes mellitus without complication (HCC)   . Diverticulitis   . GERD (gastroesophageal reflux disease)   . Hypertension   . Presence of permanent cardiac pacemaker    hx/notes 11/25/2016  . Rectal prolapse   . Renal disorder   . Rhabdomyolysis 11/2016  . Sick sinus syndrome (HCC)    hx/notes 11/25/2016  . Sinus node dysfunction (HCC)   . TIA (transient ischemic attack) 07/2016   hx/notes  11/25/2016    PAST SURGICAL HISTORY:  Past Surgical History:  Procedure Laterality Date  . ABDOMINAL HYSTERECTOMY    . ABDOMINAL SURGERY    . CARDIAC SURGERY    . CATARACT EXTRACTION Bilateral   . CHOLECYSTECTOMY    . COLON SURGERY    . HERNIA REPAIR    . INSERT / REPLACE / REMOVE PACEMAKER     hx/notes 11/25/2016  . KNEE SURGERY    . NASAL SINUS SURGERY      SOCIAL HISTORY:  Social History   Tobacco Use  . Smoking status: Former Games developer  . Smokeless tobacco: Never Used  Substance Use Topics  . Alcohol use: No    FAMILY HISTORY:  Family History  Problem Relation Age of Onset  . Heart attack Father   . Heart attack Brother     DRUG ALLERGIES:  Allergies  Allergen Reactions  . Morphine Nausea And Vomiting  . Sulfa Antibiotics Anaphylaxis and Rash  . Amoxicillin Other (See Comments)  . Benzonatate Other (See Comments)  . Buprenorphine Nausea And Vomiting  . Fish-Derived Products Nausea Only  . Penicillins Hives    Has patient had a PCN reaction causing immediate rash, facial/tongue/throat swelling, SOB or lightheadedness with hypotension: Yes Has patient had a PCN reaction causing severe rash involving mucus membranes or skin necrosis: Unknown Has patient had a PCN reaction that required hospitalization: Unknown Has patient had a PCN reaction occurring within the last 10 years: No If all of the above answers are "NO", then may proceed with Cephalosporin use.   . Ciprofloxacin Itching and Rash  . Morphine  And Related Nausea And Vomiting    REVIEW OF SYSTEMS: Unable to be obtained given encephalopathy  MEDICATIONS AT HOME:  Prior to Admission medications   Medication Sig Start Date End Date Taking? Authorizing Provider  acetaminophen (TYLENOL) 500 MG tablet Take 500 mg by mouth every 8 (eight) hours as needed.   Yes [provider]  aspirin EC 81 MG EC tablet Take 1 tablet (81 mg total) by mouth daily. 09/19/16  Yes Riccio, Angela C, DO  carvedilol  (COREG) 12.5 MG tablet Take 1 tablet (12.5 mg total) by mouth 2 (two) times daily. 07/17/16  Yes Camnitz, Andree Coss, MD  celecoxib (CELEBREX) 100 MG capsule Take 100 mg by mouth 2 (two) times daily.   Yes [provider]  Cholecalciferol (VITAMIN D) 2000 units CAPS Take 2,000 Units by mouth daily.    Yes [provider]  DULoxetine (CYMBALTA) 30 MG capsule Take 30 mg by mouth 2 (two) times daily.   Yes [provider]  metFORMIN (GLUCOPHAGE-XR) 500 MG 24 hr tablet Take 500 mg by mouth daily with breakfast.   Yes [provider]  pantoprazole (PROTONIX) 20 MG tablet Take 20 mg by mouth daily. 04/08/16  Yes [provider]  ramipril (ALTACE) 5 MG capsule Take 1 capsule (5 mg total) by mouth daily. Patient taking differently: Take 10 mg by mouth daily.  01/19/17  Yes Sharyn Creamer, MD  loratadine (CLARITIN) 10 MG tablet Take 10 mg by mouth daily as needed for allergies.    [provider]  metoCLOPramide (REGLAN) 10 MG tablet Take 1 tablet (10 mg total) by mouth every 6 (six) hours as needed (for nausea/headache). 09/16/16   Molpus, John, MD  nitroGLYCERIN (NITROSTAT) 0.4 MG SL tablet Place 0.4 mg under the tongue every 5 (five) minutes as needed for chest pain.    [provider]  traMADol (ULTRAM) 50 MG tablet Take 1 tablet (50 mg total) by mouth every 12 (twelve) hours as needed for severe pain. Patient not taking: Reported on 01/18/2017 11/28/16   Marthenia Rolling, DO      PHYSICAL EXAMINATION:   VITAL SIGNS: Blood pressure (!) 192/66, pulse 92, temperature 97.9 F (36.6 C), temperature source Oral, resp. rate (!) 22, SpO2 96 %.  GENERAL:  83 y.o.-year-old patient lying in the bed with no acute distress.  Frail-appearing EYES: Pupils equal, round, reactive to light and accommodation. No scleral icterus. Extraocular muscles intact.  HEENT: Head atraumatic, normocephalic. Oropharynx and nasopharynx clear.  NECK:  Supple, no jugular venous  distention. No thyroid enlargement, no tenderness.  LUNGS: Normal breath sounds bilaterally, no wheezing, rales,rhonchi or crepitation. No use of accessory muscles of respiration.  CARDIOVASCULAR: S1, S2 normal. No murmurs, rubs, or gallops.  ABDOMEN: Soft, nontender, nondistended. Bowel sounds present. No organomegaly or mass.  EXTREMITIES: No pedal edema, cyanosis, or clubbing.  NEUROLOGIC: PERRL, MAES, nonverbal, not following commands  PSYCHIATRIC: Sedated/lethargic status post sedation medication   SKIN: No obvious rash, lesion, or ulcer.   LABORATORY PANEL:   CBC Recent Labs  Lab 03/02/17 1043  WBC 9.3  HGB 13.5  HCT 40.5  PLT 197  MCV 92.8  MCH 31.0  MCHC 33.4  RDW 13.0  LYMPHSABS 1.9  MONOABS 0.4  EOSABS 0.1  BASOSABS 0.1   ------------------------------------------------------------------------------------------------------------------  Chemistries  Recent Labs  Lab 03/02/17 1043  NA 137  K 6.0*  CL 110  CO2 17*  GLUCOSE 151*  BUN 39*  CREATININE 1.59*  CALCIUM 9.8  AST  30  ALT 16  ALKPHOS 86  BILITOT 1.0   ------------------------------------------------------------------------------------------------------------------ CrCl cannot be calculated (Unknown ideal weight.). ------------------------------------------------------------------------------------------------------------------ No results for input(s): TSH, T4TOTAL, T3FREE, THYROIDAB in the last 72 hours.  Invalid input(s): FREET3   Coagulation profile No results for input(s): INR, PROTIME in the last 168 hours. ------------------------------------------------------------------------------------------------------------------- No results for input(s): DDIMER in the last 72 hours. -------------------------------------------------------------------------------------------------------------------  Cardiac Enzymes Recent Labs  Lab 03/02/17 1043  TROPONINI <0.03    ------------------------------------------------------------------------------------------------------------------ Invalid input(s): POCBNP  ---------------------------------------------------------------------------------------------------------------  Urinalysis    Component Value Date/Time   COLORURINE STRAW (A) 03/02/2017 1043   APPEARANCEUR CLEAR (A) 03/02/2017 1043   LABSPEC 1.011 03/02/2017 1043   PHURINE 5.0 03/02/2017 1043   GLUCOSEU NEGATIVE 03/02/2017 1043   HGBUR NEGATIVE 03/02/2017 1043   BILIRUBINUR NEGATIVE 03/02/2017 1043   KETONESUR NEGATIVE 03/02/2017 1043   PROTEINUR 100 (A) 03/02/2017 1043   NITRITE NEGATIVE 03/02/2017 1043   LEUKOCYTESUR NEGATIVE 03/02/2017 1043     RADIOLOGY: Ct Head Wo Contrast  Result Date: 03/02/2017 CLINICAL DATA:  Altered level consciousness EXAM: CT HEAD WITHOUT CONTRAST TECHNIQUE: Contiguous axial images were obtained from the base of the skull through the vertex without intravenous contrast. COMPARISON:  01/18/2017 FINDINGS: Brain: No evidence of acute infarction, hemorrhage, extra-axial collection, ventriculomegaly, or mass effect. Generalized cerebral atrophy. Periventricular white matter low attenuation likely secondary to microangiopathy. Vascular: Cerebrovascular atherosclerotic calcifications are noted. Skull: Negative for fracture or focal lesion. Sinuses/Orbits: Visualized portions of the orbits are unremarkable. Mastoid sinuses are clear. Prior turbinate resection. Other: None. IMPRESSION: 1. No acute intracranial pathology. 2. Chronic microvascular disease and cerebral atrophy. Electronically Signed   By: Elige KoHetal  Patel   On: 03/02/2017 12:49    EKG: Orders placed or performed during the hospital encounter of 03/02/17  . ED EKG  . ED EKG  . EKG 12-Lead  . EKG 12-Lead    IMPRESSION AND PLAN: 1 acute encephalopathy Presenting from assisted living with change in behavior, history of dementia noted, agitation,  yelling/screaming, crying at times in the emergency room, with associated facial asymmetry, and uncontrolled hypertension Exact etiology unknown, suspected acute CVA Admit to regular nursing floor bed on our CVA/TIA protocol, unable to perform MRI given pacer placement for sick sinus syndrome, repeat head CT in 24-36 hours, neuro checks per routine, check ammonia/RPR, neurology consult with expert opinion, fall/aspiration/skin care precautions, avoid psychotropic medicines, hold tramadol/Cymbalta, IV fluids for rehydration, aspirin daily, statin therapy-check lipids in the morning, echocardiogram, carotid Dopplers, and continue close medical monitoring  2 acute diarrhea Check GI panel, strict I&O monitoring, IV fluids for rehydration, Lactinex ordered  3 AKI w/ CKD III May be related to medication side effect in setting of diarrhea Hold metformin while in house, IV fluids for rehydration, avoid nephrotoxic agents, BMP in the morning, consider nephrology consultation if any further worsening  4 acute hyperkalemia Hold ramipril, IV fluids for rehydration, repeat potassium at 1500 hrs., BMP in the morning  5 chronic diabetes mellitus type 2 Stable Hold metformin, sliding scale insulin with Accu-Cheks per routine  6 chronic dementia without behavioral disturbance Increase nursing care as needed, aspiration/fall/skin care precautions  Full code Condition stable Prognosis poor DVT prophylaxis with heparin subcu Disposition back to assisted living versus SNF in 2-3 days  All the records are reviewed and case discussed with ED provider. Management plans discussed with the patient, family and they are in agreement.  CODE STATUS: Code Status History    Date Active Date Inactive Code Status Order ID  Comments User Context   11/25/2016 22:38 11/28/2016 22:01 Full Code [id number]  Beaulah DinningGambino, Christina M, MD Inpatient   09/17/2016 23:47 09/18/2016 22:29 Full Code [id number]  Lovena Neighboursiallo, Abdoulaye, MD ED        TOTAL TIME TAKING CARE OF THIS PATIENT: 45 minutes.    Evelena AsaMontell D Salary M.D on 03/02/2017   Between 7am to 6pm - Pager - [phone]  After 6pm go to [email or web address] - password [email or web address]  [facility]  Office  [phone]  CC: Primary care physician; Macy MisBriscoe, Kim K, MD   Note: This dictation was prepared with Dragon dictation along with smaller phrase technology. Any transcriptional errors that result from this process are unintentional.

## 2017-03-02 NOTE — ED Notes (Signed)
Pt now resting in bed; eyes closed. Pt arousable to stimuli. CT staff to attempt for CT scan.

## 2017-03-02 NOTE — ED Notes (Signed)
Pt attempting to get out of bed. Pt emesis of yellow. Orders received for nausea medication.

## 2017-03-03 ENCOUNTER — Inpatient Hospital Stay (HOSPITAL_COMMUNITY)
Admit: 2017-03-03 | Discharge: 2017-03-03 | Disposition: A | Discharge status: Home / Self Care | Payer: PPO | Attending: Family Medicine | Admitting: Family Medicine

## 2017-03-03 ENCOUNTER — Inpatient Hospital Stay: Payer: PPO

## 2017-03-03 ENCOUNTER — Other Ambulatory Visit: Payer: Self-pay

## 2017-03-03 DIAGNOSIS — I361 Nonrheumatic tricuspid (valve) insufficiency: Secondary | ICD-10-CM

## 2017-03-03 DIAGNOSIS — R410 Disorientation, unspecified: Secondary | ICD-10-CM

## 2017-03-03 LAB — LIPID PANEL
Cholesterol: 265 mg/dL — ABNORMAL HIGH (ref 0–200)
HDL: 39 mg/dL — ABNORMAL LOW (ref >40)
LDL Cholesterol: 189 mg/dL — ABNORMAL HIGH (ref 0–99)
Total CHOL/HDL Ratio: 6.8 RATIO
Triglycerides: 184 mg/dL — ABNORMAL HIGH (ref <150)
VLDL: 37 mg/dL (ref 0–40)

## 2017-03-03 LAB — GASTROINTESTINAL PANEL BY PCR, STOOL (REPLACES STOOL CULTURE)
Adenovirus F40/41: NOT DETECTED
Astrovirus: NOT DETECTED
CAMPYLOBACTER SPECIES: NOT DETECTED
CYCLOSPORA CAYETANENSIS: NOT DETECTED
Cryptosporidium: NOT DETECTED
ENTAMOEBA HISTOLYTICA: NOT DETECTED
ENTEROAGGREGATIVE E COLI (EAEC): NOT DETECTED
ENTEROPATHOGENIC E COLI (EPEC): NOT DETECTED
ENTEROTOXIGENIC E COLI (ETEC): NOT DETECTED
Giardia lamblia: NOT DETECTED
NOROVIRUS GI/GII: NOT DETECTED
PLESIMONAS SHIGELLOIDES: NOT DETECTED
ROTAVIRUS A: NOT DETECTED
SHIGA LIKE TOXIN PRODUCING E COLI (STEC): NOT DETECTED
SHIGELLA/ENTEROINVASIVE E COLI (EIEC): NOT DETECTED
Salmonella species: NOT DETECTED
Sapovirus (I, II, IV, and V): NOT DETECTED
Vibrio cholerae: NOT DETECTED
Vibrio species: NOT DETECTED
YERSINIA ENTEROCOLITICA: NOT DETECTED

## 2017-03-03 LAB — URINE CULTURE

## 2017-03-03 LAB — GLUCOSE, CAPILLARY
GLUCOSE-CAPILLARY: 127 mg/dL — AB (ref 65–99)
Glucose-Capillary: 124 mg/dL — ABNORMAL HIGH (ref 65–99)
Glucose-Capillary: 92 mg/dL (ref 65–99)
Glucose-Capillary: 138 mg/dL — ABNORMAL HIGH (ref 65–99)

## 2017-03-03 LAB — BASIC METABOLIC PANEL
ANION GAP: 10 (ref 5–15)
BUN: 31 mg/dL — ABNORMAL HIGH (ref 6–20)
CALCIUM: 9.3 mg/dL (ref 8.9–10.3)
CO2: 20 mmol/L — ABNORMAL LOW (ref 22–32)
Chloride: 109 mmol/L (ref 101–111)
Creatinine, Ser: 1.22 mg/dL — ABNORMAL HIGH (ref 0.44–1.00)
GFR, EST AFRICAN AMERICAN: 47 mL/min — AB (ref >60)
GFR, EST NON AFRICAN AMERICAN: 41 mL/min — AB (ref >60)
GLUCOSE: 123 mg/dL — AB (ref 65–99)
POTASSIUM: 4.2 mmol/L (ref 3.5–5.1)
SODIUM: 139 mmol/L (ref 135–145)

## 2017-03-03 LAB — TROPONIN I
Troponin I: 0.05 ng/mL (ref <0.03)
Troponin I: 0.05 ng/mL (ref <0.03)

## 2017-03-03 LAB — C DIFFICILE QUICK SCREEN W PCR REFLEX
C DIFFICILE (CDIFF) INTERP: NOT DETECTED
C Diff antigen: NEGATIVE
C Diff toxin: NEGATIVE

## 2017-03-03 LAB — AMMONIA: Ammonia: 29 umol/L (ref 9–35)

## 2017-03-03 LAB — HEMOGLOBIN A1C
Hgb A1c MFr Bld: 7.4 % — ABNORMAL HIGH (ref 4.8–5.6)
Mean Plasma Glucose: 165.68 mg/dL

## 2017-03-03 MED ORDER — ONDANSETRON HCL 4 MG/2ML IJ SOLN
4.0000 mg | Freq: Four times a day (QID) | INTRAMUSCULAR | Status: DC | PRN
  Administered 2017-03-03: 4 mg via INTRAVENOUS
  Filled 2017-03-03: qty 2

## 2017-03-03 NOTE — Progress Notes (Signed)
SLP Cancellation Note  Patient Details Name: Monica CageBarbara Obrecht MRN: [id number] DOB: 02/28/1934   Cancelled treatment:       Reason Eval/Treat Not Completed: Fatigue/lethargy limiting ability to participate(chart reviewed; noted Cognitive decline at baseline). As pt had an acute episode of diarrhea and dehydration which impacted her both medically and cognitively(baseline Dementia dx), pt's cognitive status would be expected to improve somewhat as medical issues resolve and would be best assessed in her own familiar setting post discharge. Pt does reside in a NH w/ caregivers. Recommend ST f/u for full assessment at NH if any decline in status is observed impacting her ADLs.  Pt is beginning to verbally communicate now at sentence level indicating her wants/needs. Less agitation noted today. NSG updated.    Jerilynn SomKatherine Watson, MS, CCC-SLP Watson,Katherine 03/03/2017, 3:35 PM

## 2017-03-03 NOTE — Progress Notes (Signed)
OT Cancellation Note  Patient Details Name: [place] CageBarbara Carrillo MRN: [id number] DOB: 04/08/1933   Cancelled Treatment:    Reason Eval/Treat Not Completed: Fatigue/lethargy limiting ability to participate. Order received, chart reviewed. Spoke with RN, pt very lethargic, unable to meaningfully participate in OT evaluation at this time. Will continue to follow acutely and re-attempt at later date/time as appropriate.  Richrd PrimeJamie Stiller, MPH, MS, OTR/L ascom [phone] 03/03/17, 10:30 AM

## 2017-03-03 NOTE — Progress Notes (Signed)
Critical lab called at 0047 for Troponin of 0.05.  MD notified. No new orders at this time.

## 2017-03-03 NOTE — Clinical Social Work Note (Signed)
Clinical Social Work Assessment  Patient Details  Name: Monica Choi MRN: [id number] Date of Birth: 11-Oct-1933  Date of referral:  03/03/17               Reason for consult:  Other (Comment Required)(From [facility] )                Permission sought to share information with:  Facility Art therapist granted to share information::  Yes, Verbal Permission Granted  Name::      [facility]   Agency::     Relationship::     Contact Information:     Housing/Transportation Living arrangements for the past 2 months:  [place] of Information:  Patient, Other (Comment Required), Facility(Grand-daughter ) Patient Interpreter Needed:  None Criminal Activity/Legal Involvement Pertinent to Current Situation/Hospitalization:  No - Comment as needed Significant Relationships:  Adult Children, Other(Comment)(Grand-daughter ) Lives with:  Facility Resident Do you feel safe going back to the place where you live?  Yes Need for family participation in patient care:  Yes (Comment)  Care giving concerns:  Patient is a resident at [facility] (fax: [phone]).    Social Worker assessment / plan:  Holiday representative (CSW) reviewed chart and noted that patient is from [facility]. Per [facility] administrator patient has been at [facility] for 5 months and walks without an assistive device at baseline. Per Horris Latino patient is on room air at baseline. Per Horris Latino patient became very confused and was having multiple BMs. Per Horris Latino patient can return to [facility] when stable. CSW met with patient at bedside and she was pleasantly confused. CSW contacted patient's grand-daughter [name] [phone]. Per [name] she is the closest family patient has and she will transport patient back to [facility]. Per [name] patient's son [name] lives in [place] and is HPOA for patient. [name] is agreeable for patient to return to [facility]. CSW  will continue to follow and assist as needed.   Employment status:  Disabled (Comment on whether or not currently receiving Disability) Insurance information:  Managed Medicare PT Recommendations:  Not assessed at this time Information / Referral to community resources:  Other (Comment Required)(Patient will return to [facility]. )  Patient/Family's Response to care:  Patient's grand-daughter [name] is agreeable for patient to return to [facility].   Patient/Family's Understanding of and Emotional Response to Diagnosis, Current Treatment, and Prognosis:  Patient was pleasantly confused. Patient's grand-daughter [name] was very pleasant and thanked CSW for assistance.   Emotional Assessment Appearance:  Appears stated age Attitude/Demeanor/Rapport:    Affect (typically observed):  Accepting, Pleasant Orientation:  Oriented to Self, Oriented to Place, Fluctuating Orientation (Suspected and/or reported Sundowners) Alcohol / Substance use:  Not Applicable Psych involvement (Current and /or in the community):  No (Comment)  Discharge Needs  Concerns to be addressed:  Discharge Planning Concerns Readmission within the last 30 days:  No Current discharge risk:  Chronically ill Barriers to Discharge:  Continued Medical Work up   [employer], Veronia Beets, LCSW 03/03/2017, 3:43 PM

## 2017-03-03 NOTE — Plan of Care (Signed)
  Progressing Education: Knowledge of General Education information will improve 03/03/2017 1837 - Progressing by Willeen NieceWitherspoon, Mussa Groesbeck H, RN Health Behavior/Discharge Planning: Ability to manage health-related needs will improve 03/03/2017 1837 - Progressing by Willeen NieceWitherspoon, Madox Corkins H, RN Clinical Measurements: Ability to maintain clinical measurements within normal limits will improve 03/03/2017 1837 - Progressing by Willeen NieceWitherspoon, Yaffa Seckman H, RN Will remain free from infection 03/03/2017 1837 - Progressing by Willeen NieceWitherspoon, Armilda Vanderlinden H, RN Diagnostic test results will improve 03/03/2017 1837 - Progressing by Willeen NieceWitherspoon, Maureen Duesing H, RN Note CT of head neg for acute abnormality; Cdiff negative. GI Panel Neg. MRSA neg Respiratory complications will improve 03/03/2017 1837 - Progressing by Willeen NieceWitherspoon, Estelene Carmack H, RN Cardiovascular complication will be avoided 03/03/2017 1837 - Progressing by Willeen NieceWitherspoon, Saed Hudlow H, RN Activity: Risk for activity intolerance will decrease 03/03/2017 1837 - Progressing by Willeen NieceWitherspoon, Dawnita Molner H, RN Note [facility]  Didn't want to work w/PT today b/c she felt nauseous.  Granddaughter stated that this is a chronic problem.  More acid reflux than nausea.  Nutrition: Adequate nutrition will be maintained 03/03/2017 1837 - Progressing by Willeen NieceWitherspoon, Trease Bremner H, RN Coping: Level of anxiety will decrease 03/03/2017 1837 - Progressing by Willeen NieceWitherspoon, Aryaan Persichetti H, RN Elimination: Will not experience complications related to bowel motility 03/03/2017 1837 - Progressing by Willeen NieceWitherspoon, Brittay Mogle H, RN Will not experience complications related to urinary retention 03/03/2017 1837 - Progressing by Willeen NieceWitherspoon, Alasha Mcguinness H, RN Pain Managment: General experience of comfort will improve 03/03/2017 1837 - Progressing by Willeen NieceWitherspoon, Yug Loria H, RN Safety: Ability to remain free from injury will improve 03/03/2017 1837 - Progressing by Willeen NieceWitherspoon, Durwin Davisson H, RN Note Pt will  not have sitter tonight.  She is impulsive.  Needs frequent checks and prompts to go to the [facility] Skin Integrity: Risk for impaired skin integrity will decrease 03/03/2017 1837 - Progressing by Willeen NieceWitherspoon, Tonea Leiphart H, RN Education: Knowledge of disease or condition will improve 03/03/2017 1837 - Progressing by Willeen NieceWitherspoon, Stevie Ertle H, RN Health Behavior/Discharge Planning: Ability to manage health-related needs will improve 03/03/2017 1837 - Progressing by Willeen NieceWitherspoon, Arvie Bartholomew H, RN Self-Care: Ability to participate in self-care as condition permits will improve 03/03/2017 1837 - Progressing by Willeen NieceWitherspoon, Keera Altidor H, RN Ability to communicate needs accurately will improve 03/03/2017 1837 - Progressing by Willeen NieceWitherspoon, Donyetta Ogletree H, RN Nutrition: Risk of aspiration will decrease 03/03/2017 1837 - Progressing by Willeen NieceWitherspoon, Jeanluc Wegman H, RN Ischemic Stroke/TIA Tissue Perfusion: Complications of ischemic stroke/TIA will be minimized 03/03/2017 1837 - Progressing by Willeen NieceWitherspoon, Mikayela Deats H, RN

## 2017-03-03 NOTE — Progress Notes (Signed)
PT Cancellation Note  Patient Details Name: Monica CageBarbara Choi MRN: [id number] DOB: 05/11/1933   Cancelled Treatment:    Reason Eval/Treat Not Completed: Other (comment)(nausea)  Per RN pt very lethargic in the a.m. And not appropriate for PT.  Attempted to see pt in p.m. However pt feeling sick and RN about to give anti-emetic medication.  Pt fearful of throwing up if she got out of bed.  Will continue to follow.  Wenonah Milo A Ej Pinson, PT 03/03/2017, 4:45 PM

## 2017-03-03 NOTE — [place]2 (Signed)
[place] MEDICAID FL2 LEVEL OF CARE SCREENING TOOL     IDENTIFICATION  Patient Name: Monica CageBarbara Choi Birthdate: 11/11/1933 Sex: female Admission Date (Current Location): 03/02/2017  [place]   Facility and Address:  [facility], [address], [place], [place] [address]      Provider Number: [id number]  Attending Physician Name and Address:  Adrian SaranMody, Sital, MD  Relative Name and Phone Number:       Current Level of Care: Hospital Recommended Level of Care: Assisted Living Facility Prior Approval Number:    Date Approved/Denied:   PASRR Number: ([id number] )  Discharge Plan: Domiciliary (Rest home)    Current Diagnoses: Patient Active Problem List   Diagnosis Date Noted  . Encephalopathy 03/02/2017  . Rhabdomyolysis 11/25/2016  . Nonintractable headache   . TIA (transient ischemic attack) 09/17/2016  . Hypertension 07/17/2016  . Sick sinus syndrome (HCC) 07/17/2016    Orientation RESPIRATION BLADDER Height & Weight     Self, Time  Normal Incontinent Weight: 144 lb (65.3 kg) Height:  5\' 6"  (167.6 cm)  BEHAVIORAL SYMPTOMS/MOOD NEUROLOGICAL BOWEL NUTRITION STATUS      Incontinent Diet(Dysphagia 3 (w/ minced meats) w/ thin liquids; aspiration precautions.)  AMBULATORY STATUS COMMUNICATION OF NEEDS Skin   Limited Assist Verbally Normal                       Personal Care Assistance Level of Assistance  Bathing, Feeding, Dressing Bathing Assistance: Limited assistance Feeding assistance: Independent Dressing Assistance: Limited assistance     Functional Limitations Info  Sight, Hearing, Speech Sight Info: Adequate Hearing Info: Impaired Speech Info: Adequate    SPECIAL CARE FACTORS FREQUENCY  PT (By licensed PT)     PT Frequency: (2-3 home health )              Contractures      Additional Factors Info  Code Status, Allergies Code Status Info: (Full Code. ) Allergies Info: (Morphine,  Sulfa Antibiotics, Amoxicillin, Benzonatate, Buprenorphine, Fish-derived Products, Penicillins, Ciprofloxacin, Morphine and related. )           Current Medications (03/03/2017):  This is the current hospital active medication list Current Facility-Administered Medications  Medication Dose Route Frequency Provider Last Rate Last Dose  . 0.45 % sodium chloride infusion   Intravenous Continuous Salary, Montell D, MD 100 mL/hr at 03/03/17 1504    . acetaminophen (TYLENOL) tablet 650 mg  650 mg Oral Q4H PRN Salary, Montell D, MD       Or  . acetaminophen (TYLENOL) solution 650 mg  650 mg Per Tube Q4H PRN Salary, Montell D, MD       Or  . acetaminophen (TYLENOL) suppository 650 mg  650 mg Rectal Q4H PRN Salary, Montell D, MD      . aspirin EC tablet 81 mg  81 mg Oral Daily Salary, Montell D, MD   81 mg at 03/03/17 1311  . atorvastatin (LIPITOR) tablet 20 mg  20 mg Oral q1800 Salary, Montell D, MD      . carvedilol (COREG) tablet 12.5 mg  12.5 mg Oral BID WC Salary, Montell D, MD   12.5 mg at 03/03/17 1311  . cholecalciferol (VITAMIN D) tablet 2,000 Units  2,000 Units Oral Daily Salary, Montell D, MD      . haloperidol (HALDOL) tablet 5 mg  5 mg Oral Q4H PRN Salary, Evelena AsaMontell D, MD       Or  . haloperidol  lactate (HALDOL) injection 5 mg  5 mg Intramuscular Q4H PRN Salary, Montell D, MD   5 mg at 03/02/17 1551  . heparin injection 5,000 Units  5,000 Units Subcutaneous Q8H Salary, Montell D, MD   5,000 Units at 03/03/17 1455  . hydrALAZINE (APRESOLINE) injection 10 mg  10 mg Intravenous Q4H PRN Salary, Montell D, MD      . insulin aspart (novoLOG) injection 0-15 Units  0-15 Units Subcutaneous TID WC Salary, Montell D, MD   2 Units at 03/03/17 1313  . insulin aspart (novoLOG) injection 0-5 Units  0-5 Units Subcutaneous QHS Salary, Montell D, MD      . lactobacillus (FLORANEX/LACTINEX) granules 1 g  1 g Oral TID WC Salary, Montell D, MD   1 g at 03/03/17 1313  . loratadine (CLARITIN) tablet 10 mg  10  mg Oral Daily PRN Salary, Montell D, MD      . nitroGLYCERIN (NITROSTAT) SL tablet 0.4 mg  0.4 mg Sublingual Q5 min PRN Salary, Montell D, MD      . ondansetron (ZOFRAN) injection 4 mg  4 mg Intravenous Q6H PRN Adrian SaranMody, Sital, MD   4 mg at 03/03/17 1352  . pantoprazole (PROTONIX) EC tablet 40 mg  40 mg Oral QAC breakfast Salary, Montell D, MD   40 mg at 03/03/17 1311  . pneumococcal 23 valent vaccine (PNU-IMMUNE) injection 0.5 mL  0.5 mL Intramuscular Tomorrow-1000 Salary, Montell D, MD         Discharge Medications: Please see discharge summary for a list of discharge medications.  Relevant Imaging Results:  Relevant Lab Results:   Additional Information (SSN: [id number])  Monica Choi, Darleen CrockerBailey M, LCSW

## 2017-03-03 NOTE — Consult Note (Signed)
Reason for Consult:AMS Referring Physician: Mody  CC: AMS  HPI: Monica CageBarbara Choi is an 83 y.o. female who is unable to provide any reliable history due to mental status.  Family not available at this time therefore all history obtained from the chart.  Apparently at baseline the patient is demented but is calm and is able to carry on conversation.  Today she was found throwing stool and very confused.  No reports of fevers.  Was also noted to have a mild left facial droop and reportedly had diarrhea.  Was at baseline at Thanksgiving dinner.     Past Medical History:  Diagnosis Date  . Anemia   . Anxiety   . Arrhythmia   . Diabetes mellitus without complication (HCC)   . Diverticulitis   . GERD (gastroesophageal reflux disease)   . Hypertension   . Presence of permanent cardiac pacemaker    hx/notes 11/25/2016  . Rectal prolapse   . Renal disorder   . Rhabdomyolysis 11/2016  . Sick sinus syndrome (HCC)    hx/notes 11/25/2016  . Sinus node dysfunction (HCC)   . TIA (transient ischemic attack) 07/2016   hx/notes 11/25/2016    Past Surgical History:  Procedure Laterality Date  . ABDOMINAL HYSTERECTOMY    . ABDOMINAL SURGERY    . CARDIAC SURGERY    . CATARACT EXTRACTION Bilateral   . CHOLECYSTECTOMY    . COLON SURGERY    . HERNIA REPAIR    . INSERT / REPLACE / REMOVE PACEMAKER     hx/notes 11/25/2016  . KNEE SURGERY    . NASAL SINUS SURGERY      Family History  Problem Relation Age of Onset  . Heart attack Father   . Heart attack Brother     Social History:  reports that she has quit smoking. she has never used smokeless tobacco. She reports that she does not drink alcohol or use drugs.  Allergies  Allergen Reactions  . Morphine Nausea And Vomiting  . Sulfa Antibiotics Anaphylaxis and Rash  . Amoxicillin Other (See Comments)  . Benzonatate Other (See Comments)  . Buprenorphine Nausea And Vomiting  . Fish-Derived Products Nausea Only  . Penicillins Hives    Has patient  had a PCN reaction causing immediate rash, facial/tongue/throat swelling, SOB or lightheadedness with hypotension: Yes Has patient had a PCN reaction causing severe rash involving mucus membranes or skin necrosis: Unknown Has patient had a PCN reaction that required hospitalization: Unknown Has patient had a PCN reaction occurring within the last 10 years: No If all of the above answers are "NO", then may proceed with Cephalosporin use.   . Ciprofloxacin Itching and Rash  . Morphine And Related Nausea And Vomiting    Medications:  I have reviewed the patient's current medications. Prior to Admission:  Medications Prior to Admission  Medication Sig Dispense Refill Last Dose  . acetaminophen (TYLENOL) 500 MG tablet Take 500 mg by mouth every 8 (eight) hours as needed.   03/01/2017 at 2100  . aspirin EC 81 MG EC tablet Take 1 tablet (81 mg total) by mouth daily. 30 tablet 0 03/01/2017 at 0900  . carvedilol (COREG) 12.5 MG tablet Take 1 tablet (12.5 mg total) by mouth 2 (two) times daily. 60 tablet 11 03/01/2017 at 0900  . celecoxib (CELEBREX) 100 MG capsule Take 100 mg by mouth 2 (two) times daily.   03/01/2017 at 2100  . Cholecalciferol (VITAMIN D) 2000 units CAPS Take 2,000 Units by mouth daily.  03/01/2017 at 0900  . DULoxetine (CYMBALTA) 30 MG capsule Take 30 mg by mouth 2 (two) times daily.   03/01/2017 at 2100  . metFORMIN (GLUCOPHAGE-XR) 500 MG 24 hr tablet Take 500 mg by mouth daily with breakfast.   03/01/2017 at 0900  . pantoprazole (PROTONIX) 20 MG tablet Take 20 mg by mouth daily.   03/01/2017 at 0900  . ramipril (ALTACE) 5 MG capsule Take 1 capsule (5 mg total) by mouth daily. (Patient taking differently: Take 10 mg by mouth daily. ) 7 capsule 0 03/01/2017 at 0900  . loratadine (CLARITIN) 10 MG tablet Take 10 mg by mouth daily as needed for allergies.   prn at prn  . metoCLOPramide (REGLAN) 10 MG tablet Take 1 tablet (10 mg total) by mouth every 6 (six) hours as needed (for  nausea/headache). 12 tablet 0 prn at prn  . nitroGLYCERIN (NITROSTAT) 0.4 MG SL tablet Place 0.4 mg under the tongue every 5 (five) minutes as needed for chest pain.   prn at prn  . traMADol (ULTRAM) 50 MG tablet Take 1 tablet (50 mg total) by mouth every 12 (twelve) hours as needed for severe pain. (Patient not taking: Reported on 01/18/2017) 30 tablet  Not Taking at Unknown time   Scheduled: . aspirin EC  81 mg Oral Daily  . atorvastatin  20 mg Oral q1800  . carvedilol  12.5 mg Oral BID WC  . cholecalciferol  2,000 Units Oral Daily  . heparin  5,000 Units Subcutaneous Q8H  . insulin aspart  0-15 Units Subcutaneous TID WC  . insulin aspart  0-5 Units Subcutaneous QHS  . lactobacillus  1 g Oral TID WC  . pantoprazole  40 mg Oral QAC breakfast  . pneumococcal 23 valent vaccine  0.5 mL Intramuscular Tomorrow-1000    ROS: History obtained from the patient  General ROS: negative for - chills, fatigue, fever, night sweats, weight gain or weight loss Psychological ROS: negative for - behavioral disorder, hallucinations, memory difficulties, mood swings or suicidal ideation Ophthalmic ROS: negative for - blurry vision, double vision, eye pain or loss of vision ENT ROS: negative for - epistaxis, nasal discharge, oral lesions, sore throat, tinnitus or vertigo Allergy and Immunology ROS: negative for - hives or itchy/watery eyes Hematological and Lymphatic ROS: negative for - bleeding problems, bruising or swollen lymph nodes Endocrine ROS: negative for - galactorrhea, hair pattern changes, polydipsia/polyuria or temperature intolerance Respiratory ROS: negative for - cough, hemoptysis, shortness of breath or wheezing Cardiovascular ROS: negative for - chest pain, dyspnea on exertion, edema or irregular heartbeat Gastrointestinal ROS: negative for - abdominal pain, diarrhea, hematemesis, nausea/vomiting or stool incontinence Genito-Urinary ROS: negative for - dysuria, hematuria, incontinence or  urinary frequency/urgency Musculoskeletal ROS: right shoulder pain Neurological ROS: as noted in HPI Dermatological ROS: negative for rash and skin lesion changes  Physical Examination: Blood pressure 114/81, pulse 92, temperature 99.7 F (37.6 C), temperature source Axillary, resp. rate 20, height 5\' 6"  (1.676 m), weight 65.3 kg (144 lb), SpO2 94 %.  HEENT-  Normocephalic, no lesions, without obvious abnormality.  Normal external eye and conjunctiva.  Normal TM's bilaterally.  Normal auditory canals and external ears. Normal external nose, mucus membranes and septum.  Normal pharynx. Cardiovascular- S1, S2 normal, pulses palpable throughout   Lungs- chest clear, no wheezing, rales, normal symmetric air entry Abdomen- soft, non-tender; bowel sounds normal; no masses,  no organomegaly Extremities- no edema Lymph-no adenopathy palpable Musculoskeletal-no joint tenderness, deformity or swelling Skin-warm and dry, no hyperpigmentation,  vitiligo, or suspicious lesions  Neurological Examination   Mental Status: Alert, oriented to person, place and time.  Cooperative, thought content appropriate.  Speech fluent without evidence of aphasia.  Needs extensive reinforcement to follow 3 step commands. Cranial Nerves: II: Discs flat bilaterally; Visual fields grossly normal, pupils equal, round, reactive to light and accommodation III,IV, VI: ptosis not present, extra-ocular motions intact bilaterally V,VII: smile symmetric, facial light touch sensation normal bilaterally VIII: hearing normal bilaterally IX,X: gag reflex present XI: bilateral shoulder shrug XII: midline tongue extension Motor: Right : Upper extremity   5/5 (limited due to shoulder pain)    Left:     Upper extremity   5/5  Lower extremity   5/5 w/limitation of foot dorsiflexion     Lower extremity   5/5 Tone and bulk:normal tone throughout; no atrophy noted Sensory: Pinprick and light touch intact throughout, bilaterally Deep  Tendon Reflexes: 2+ and symmetric with absent AJ's bilaterally Plantars: Right: downgoing   Left: downgoing Cerebellar: Normal finger-to-nose and normal heel-to-shin testing bilaterally Gait: not tested due to safety concerns   Laboratory Studies:   Basic Metabolic Panel: Recent Labs  Lab 03/02/17 1043 03/02/17 1945 03/03/17 0543  NA 137  --  139  K 6.0* 4.5 4.2  CL 110  --  109  CO2 17*  --  20*  GLUCOSE 151*  --  123*  BUN 39*  --  31*  CREATININE 1.59*  --  1.22*  CALCIUM 9.8  --  9.3    Liver Function Tests: Recent Labs  Lab 03/02/17 1043  AST 30  ALT 16  ALKPHOS 86  BILITOT 1.0  PROT 7.5  ALBUMIN 4.2   No results for input(s): LIPASE, AMYLASE in the last 168 hours. Recent Labs  Lab 03/02/17 1945 03/02/17 2344  AMMONIA 27 29    CBC: Recent Labs  Lab 03/02/17 1043  WBC 9.3  NEUTROABS 6.9*  HGB 13.5  HCT 40.5  MCV 92.8  PLT 197    Cardiac Enzymes: Recent Labs  Lab 03/02/17 1043 03/02/17 1945 03/02/17 2344 03/03/17 0543  TROPONINI <0.03 0.03* 0.05* 0.05*    BNP: Invalid input(s): POCBNP  CBG: Recent Labs  Lab 03/02/17 1722 03/02/17 2125 03/03/17 0754 03/03/17 1134  GLUCAP 161* 126* 124* 127*    Microbiology: Results for orders placed or performed during the hospital encounter of 03/02/17  Urine culture     Status: Abnormal   Collection Time: 03/02/17 11:25 AM  Result Value Ref Range Status   Specimen Description URINE, RANDOM  Final   Special Requests NONE  Final   Culture MULTIPLE SPECIES PRESENT, SUGGEST RECOLLECTION (A)  Final   Report Status 03/03/2017 FINAL  Final  MRSA PCR Screening     Status: None   Collection Time: 03/02/17  5:14 PM  Result Value Ref Range Status   MRSA by PCR NEGATIVE NEGATIVE Final    Comment:        The GeneXpert MRSA Assay (FDA approved for NASAL specimens only), is one component of a comprehensive MRSA colonization surveillance program. It is not intended to diagnose MRSA infection nor  to guide or monitor treatment for MRSA infections.     Coagulation Studies: Recent Labs    03/02/17 1945  LABPROT 12.4  INR 0.93    Urinalysis:  Recent Labs  Lab 03/02/17 1043  COLORURINE STRAW*  LABSPEC 1.011  PHURINE 5.0  GLUCOSEU NEGATIVE  HGBUR NEGATIVE  BILIRUBINUR NEGATIVE  KETONESUR NEGATIVE  PROTEINUR 100*  NITRITE  NEGATIVE  LEUKOCYTESUR NEGATIVE    Lipid Panel:     Component Value Date/Time   CHOL 265 (H) 03/03/2017 0543   TRIG 184 (H) 03/03/2017 0543   HDL 39 (L) 03/03/2017 0543   CHOLHDL 6.8 03/03/2017 0543   VLDL 37 03/03/2017 0543   LDLCALC 189 (H) 03/03/2017 0543    HgbA1C:  Lab Results  Component Value Date   HGBA1C 8.8 (H) 09/18/2016    Urine Drug Screen:      Component Value Date/Time   LABOPIA NONE DETECTED 03/02/2017 1043   LABOPIA NONE DETECTED 09/17/2016 2145   COCAINSCRNUR NONE DETECTED 03/02/2017 1043   LABBENZ NONE DETECTED 03/02/2017 1043   LABBENZ NONE DETECTED 09/17/2016 2145   AMPHETMU NONE DETECTED 03/02/2017 1043   AMPHETMU NONE DETECTED 09/17/2016 2145   THCU NONE DETECTED 03/02/2017 1043   THCU NONE DETECTED 09/17/2016 2145   LABBARB NONE DETECTED 03/02/2017 1043   LABBARB NONE DETECTED 09/17/2016 2145    Alcohol Level:  Recent Labs  Lab 03/02/17 1043  ETH <10    Other results: EKG: sinus rhythm at 77 bpm.  Imaging: Ct Head Wo Contrast  Result Date: 03/02/2017 CLINICAL DATA:  Altered level consciousness EXAM: CT HEAD WITHOUT CONTRAST TECHNIQUE: Contiguous axial images were obtained from the base of the skull through the vertex without intravenous contrast. COMPARISON:  01/18/2017 FINDINGS: Brain: No evidence of acute infarction, hemorrhage, extra-axial collection, ventriculomegaly, or mass effect. Generalized cerebral atrophy. Periventricular white matter low attenuation likely secondary to microangiopathy. Vascular: Cerebrovascular atherosclerotic calcifications are noted. Skull: Negative for fracture or  focal lesion. Sinuses/Orbits: Visualized portions of the orbits are unremarkable. Mastoid sinuses are clear. Prior turbinate resection. Other: None. IMPRESSION: 1. No acute intracranial pathology. 2. Chronic microvascular disease and cerebral atrophy. Electronically Signed   By: Elige Ko   On: 03/02/2017 12:49   US Carotid Bilateral (at [facility] And Ap Only)  Result Date: 03/02/2017 CLINICAL DATA:  Acute encephalopathy. History of hypertension and diabetes. EXAM: BILATERAL CAROTID DUPLEX ULTRASOUND TECHNIQUE: Wallace Cullens scale imaging, color Doppler and duplex ultrasound were performed of bilateral carotid and vertebral arteries in the neck. COMPARISON:  None. FINDINGS: Criteria: Quantification of carotid stenosis is based on velocity parameters that correlate the residual internal carotid diameter with NASCET-based stenosis levels, using the diameter of the distal internal carotid lumen as the denominator for stenosis measurement. The following velocity measurements were obtained: RIGHT ICA:  74/15 cm/sec CCA:  75/12 cm/sec SYSTOLIC ICA/CCA RATIO:  1.0 DIASTOLIC ICA/CCA RATIO:  1.2 ECA:  64 cm/sec LEFT ICA:  Unable to be obtained due to patient's combative behavior CCA:  Unable to be obtained due to patient's combative behavior ECA:  Unable to be obtained due to patient's combative behavior RIGHT CAROTID ARTERY: There is a moderate amount of eccentric echogenic plaque involving the origin and proximal aspects of the right internal carotid artery (image 24), not resulting in elevated peak systolic velocities within the interrogated course the right internal carotid artery to suggest a hemodynamically significant stenosis. RIGHT VERTEBRAL ARTERY:  Antegrade flow LEFT CAROTID ARTERY: Not imaged due to patient's combative behavior. LEFT VERTEBRAL ARTERY: Not imaged due to patient's combative behavior. IMPRESSION: 1. Moderate amount of right-sided atherosclerotic plaque, not definitely resulting in a hemodynamically  significant stenosis. 2. The left carotid system was not evaluated due to patient's combative behavior and inability to tolerate the examination. Electronically Signed   By: Simonne Come M.D.   On: 03/02/2017 16:46     Assessment/Plan: 83 year old female  with a history of dementia presenting with altered mental status.  Per description patient appears improved today.  Had diarrhea at the facility.  Lab work suggested patient was dehydrated as well with BUN/Cr above baseline.  This is currently improving.  Head CT reviewed and shows no acute changes. Dehydration may very well have been the source of change in mental status.  Improvement will lag behind lab work to some extent.    Agree with current management.     Thana FarrLeslie Tomeka Kantner, MD Neurology [phone] 03/03/2017, 11:53 AM

## 2017-03-03 NOTE — Evaluation (Signed)
Clinical/Bedside Swallow Evaluation Patient Details  Name: Monica CageBarbara Choi MRN: [id number] Date of Birth: 12/07/1933  Today's Date: 03/03/2017 Time: SLP Start Time (ACUTE ONLY): 0945 SLP Stop Time (ACUTE ONLY): 1045 SLP Time Calculation (min) (ACUTE ONLY): 60 min  Past Medical History:  Past Medical History:  Diagnosis Date  . Anemia   . Anxiety   . Arrhythmia   . Diabetes mellitus without complication (HCC)   . Diverticulitis   . GERD (gastroesophageal reflux disease)   . Hypertension   . Presence of permanent cardiac pacemaker    hx/notes 11/25/2016  . Rectal prolapse   . Renal disorder   . Rhabdomyolysis 11/2016  . Sick sinus syndrome (HCC)    hx/notes 11/25/2016  . Sinus node dysfunction (HCC)   . TIA (transient ischemic attack) 07/2016   hx/notes 11/25/2016   Past Surgical History:  Past Surgical History:  Procedure Laterality Date  . ABDOMINAL HYSTERECTOMY    . ABDOMINAL SURGERY    . CARDIAC SURGERY    . CATARACT EXTRACTION Bilateral   . CHOLECYSTECTOMY    . COLON SURGERY    . HERNIA REPAIR    . INSERT / REPLACE / REMOVE PACEMAKER     hx/notes 11/25/2016  . KNEE SURGERY    . NASAL SINUS SURGERY     HPI:  Pt is is a 83 y.o. female with a known history of dementia, chronic kidney disease stage III, pacemaker for sick sinus syndrome, presenting from assisted living facility with acute confusion noted earlier this morning, patient was agitated/combative/yelling, also had associated diarrhea to facility, brought to the emergency room noted facial asymmetry/left facial droop on exam, patient requiring sedation to obtain CT scan of the head which was negative for any acute process, potassium was 6, creatinine 1.5 with baseline 1, urinalysis/urine drug screen negative, EKG with T wave inversion far laterally, patient evaluated in the emergency room status post sedation therapy, currently in restraints, family at the bedside, patient with noted left nasolabial fold flattening, not  following commands, patient is poor historian due to encephalopathy, patient now being admitted for acute encephalopathy with facial asymmetry, acute diarrhea, acute kidney injury with chronic kidney disease stage III, and hyperkalemia.   Assessment / Plan / Recommendation Clinical Impression  Pt appears to exhibit adequate oropharyngeal phase swallow function w/ reduced aspiration risk when following general aspiration precautions. Pt's past med hx includes Dementia(on Namenda per chart) and Chronic kidney disease stage III per chart. Cognitive decline can increase risk for aspiration/choking hazards in some pts. Pt does require min verbal cues for follow through w/ tasks as she appears min confused and disoriented but overall this is improving per NSG report. Pt was able to verbalize at sentence level w/ SLP and others during this eval indicating wants/needs. Pt was given trials of ice chips, thin liquids via cup and straw, puree, and soft solids. During trials of ice chips and soft solids, pt's oral phase appeared grossly WFL c/b bolus management and control, adequate mastication pattern, timely A-P transit and swallow, and adeqaute oral clearing. No immediate, overt s/s of aspiration were noted. No decline in vocal quality or respiratory status were noted. During trials of thin liquid via cup and straw, pt's oral phase appeared grossly [facility] c/b adequate labial seal around straw, timely A-P transit and swallow, and adeqaute oral clearing. No overt, immediate s/s of aspiration were noted. No decline in vocal quality or respiratory status were noted. During trials of puree, pt's oral phase was grossly [facility] c/b  adeqaute labial seal around spoon, timely A-P transit and swallow, and adequate oral clearing. No overt, immediate s/s of aspiration were noted. No decline in respiratory status or vocal quality were noted. Pt was able to express basic wants/needs - wanting pills in puree, feeling of fullness/not wanting  anymore food, prefering straw to cup drinking. Pt was an independent feeder w/ min setup/assist.  D/t pt's Diagnosis of Dementia (per chart review), Cognitive status was not assessed at this evaluation and would be better assessed at the next venue of care when the acuity of illness has resolved if any decline from previous level of functioning is noted.  D/t pt's present appearance, recommend Dysphagia level 3 diet (w/ minced meats) w/ thin liquids for easier mastication and conservation of energy. Recommend aspiration precautions including sitting upright for meals, reducing environmental distractions, small bites/sips, and slow pace. Recommend min assist/set up for meals. Recommend pills whole in puree.  ST services will f/u w/ pt status, diet toleration, and education while admitted as needed. NSG/MD updated.  SLP Visit Diagnosis: Dysphagia, unspecified (R13.10)(Dementia baseline)    Aspiration Risk  (reduced following aspiration precautions)    Diet Recommendation   Dysphagia 3 (w/ minced meats) w/ thin liquids; aspiration precautions.  Medication Administration: Whole meds with puree    Other  Recommendations Oral Care Recommendations: Oral care BID;Staff/trained caregiver to provide oral care   Follow up Recommendations (TBD)      Frequency and Duration min 2x/week  1 week       Prognosis Prognosis for Safe Diet Advancement: Good Barriers to Reach Goals: Cognitive deficits      Swallow Study   General Date of Onset: 03/02/17 HPI: Pt is is a 83 y.o. female with a known history of dementia, chronic kidney disease stage III, pacemaker for sick sinus syndrome, presenting from assisted living facility with acute confusion noted earlier this morning, patient was agitated/combative/yelling, also had associated diarrhea to facility, brought to the emergency room noted facial asymmetry/left facial droop on exam, patient requiring sedation to obtain CT scan of the head which was negative  for any acute process, potassium was 6, creatinine 1.5 with baseline 1, urinalysis/urine drug screen negative, EKG with T wave inversion far laterally, patient evaluated in the emergency room status post sedation therapy, currently in restraints, family at the bedside, patient with noted left nasolabial fold flattening, not following commands, patient is poor historian due to encephalopathy, patient now being admitted for acute encephalopathy with facial asymmetry, acute diarrhea, acute kidney injury with chronic kidney disease stage III, and hyperkalemia. Type of Study: Bedside Swallow Evaluation Previous Swallow Assessment: none reported Diet Prior to this Study: NPO Temperature Spikes Noted: N/A(slight temp spike; wbc WNL) Respiratory Status: Room air History of Recent Intubation: No Behavior/Cognition: Alert;Pleasant mood;Cooperative;Confused;Distractible;Requires cueing Oral Cavity Assessment: Within Functional Limits Oral Care Completed by SLP: Yes Oral Cavity - Dentition: Adequate natural dentition Vision: Functional for self-feeding Self-Feeding Abilities: Able to feed self;Needs assist;Needs set up Patient Positioning: Upright in bed Baseline Vocal Quality: Normal Volitional Cough: Strong Volitional Swallow: Able to elicit    Oral/Motor/Sensory Function Overall Oral Motor/Sensory Function: Within functional limits   Ice Chips Ice chips: Within functional limits Presentation: Spoon;Self Fed(3 trials)   Thin Liquid Thin Liquid: Within functional limits Presentation: Spoon;Cup;Self Fed(6 trials)    Nectar Thick Nectar Thick Liquid: Not tested   Honey Thick Honey Thick Liquid: Not tested   Puree Puree: Within functional limits Presentation: Spoon;Self Fed(4 trials)   Solid   GO  Solid: Within functional limits Presentation: Self Fed(4 trials)        Nancy Fetter, SLP-Graduate Student Nancy Fetter 03/03/2017,11:15 AM  The information in this patient note, response to  treatment, and overall treatment plan developed has been reviewed and agreed upon by this clinician.  Jerilynn Som, MS, CCC-SLP [phone] 03/04/17,12:28 PM

## 2017-03-03 NOTE — Progress Notes (Signed)
[employer] - [place] [facility]   PATIENT NAME: Monica CageBarbara Choi    MR#:  [id number]  DATE OF BIRTH:  11/16/1933  SUBJECTIVE:   Apparently patient was combative last night and there is a sitter at bedside. She appears calm this morning. She does express that she is thirsty and has a dry mouth.  REVIEW OF SYSTEMS:    Review of Systems  Constitutional: Negative for fever, chills weight loss HENT: Negative for ear pain, nosebleeds, congestion, facial swelling, rhinorrhea, neck pain, neck stiffness and ear discharge.   Respiratory: Negative for cough, shortness of breath, wheezing  Cardiovascular: Negative for chest pain, palpitations and leg swelling.  Gastrointestinal: Negative for heartburn, abdominal pain, vomiting, diarrhea or consitpation Genitourinary: Negative for dysuria, urgency, frequency, hematuria Musculoskeletal: Negative for back pain or joint pain Neurological: Negative for dizziness, seizures, syncope, focal weakness,  numbness and headaches.  Hematological: Does not bruise/bleed easily.  Psychiatric/Behavioral: Positive agitation last night    Tolerating Diet: NPO      DRUG ALLERGIES:   Allergies  Allergen Reactions  . Morphine Nausea And Vomiting  . Sulfa Antibiotics Anaphylaxis and Rash  . Amoxicillin Other (See Comments)  . Benzonatate Other (See Comments)  . Buprenorphine Nausea And Vomiting  . Fish-Derived Products Nausea Only  . Penicillins Hives    Has patient had a PCN reaction causing immediate rash, facial/tongue/throat swelling, SOB or lightheadedness with hypotension: Yes Has patient had a PCN reaction causing severe rash involving mucus membranes or skin necrosis: Unknown Has patient had a PCN reaction that required hospitalization: Unknown Has patient had a PCN reaction occurring within the last 10 years: No If all of the above answers are "NO", then may proceed with Cephalosporin use.   . Ciprofloxacin Itching and Rash   . Morphine And Related Nausea And Vomiting    VITALS:  Blood pressure 114/81, pulse 92, temperature 99.7 F (37.6 C), temperature source Axillary, resp. rate 20, height 5\' 6"  (1.676 m), weight 65.3 kg (144 lb), SpO2 94 %.  PHYSICAL EXAMINATION:  Constitutional: Appears well-developed and well-nourished. No distress. Very calm HENT: Normocephalic. Marland Kitchen. Oropharynx is clear and moist. Dry mucous membranes Eyes: Conjunctivae and EOM are normal. PERRLA, no scleral icterus.  Neck: Normal ROM. Neck supple. No JVD. No tracheal deviation. CVS: RRR, S1/S2 +, no murmurs, no gallops, no carotid bruit.  Pulmonary: Effort and breath sounds normal, no stridor, rhonchi, wheezes, rales.  Abdominal: Soft. BS +,  no distension, tenderness, rebound or guarding.  Musculoskeletal: Normal range of motion. No edema and no tenderness.  Neuro: Alert. CN 2-12 grossly intact. No focal deficits. Skin: Skin is warm and dry. No rash noted. Psychiatric: Normal mood and affect.      LABORATORY PANEL:   CBC Recent Labs  Lab 03/02/17 1043  WBC 9.3  HGB 13.5  HCT 40.5  PLT 197   ------------------------------------------------------------------------------------------------------------------  Chemistries  Recent Labs  Lab 03/02/17 1043 03/02/17 1945  NA 137  --   K 6.0* 4.5  CL 110  --   CO2 17*  --   GLUCOSE 151*  --   BUN 39*  --   CREATININE 1.59*  --   CALCIUM 9.8  --   AST 30  --   ALT 16  --   ALKPHOS 86  --   BILITOT 1.0  --    ------------------------------------------------------------------------------------------------------------------  Cardiac Enzymes Recent Labs  Lab 03/02/17 1945 03/02/17 2344 03/03/17 0543  TROPONINI 0.03* 0.05* 0.05*   ------------------------------------------------------------------------------------------------------------------  RADIOLOGY:  Ct Head Wo Contrast  Result Date: 03/02/2017 CLINICAL DATA:  Altered level consciousness EXAM: CT HEAD  WITHOUT CONTRAST TECHNIQUE: Contiguous axial images were obtained from the base of the skull through the vertex without intravenous contrast. COMPARISON:  01/18/2017 FINDINGS: Brain: No evidence of acute infarction, hemorrhage, extra-axial collection, ventriculomegaly, or mass effect. Generalized cerebral atrophy. Periventricular white matter low attenuation likely secondary to microangiopathy. Vascular: Cerebrovascular atherosclerotic calcifications are noted. Skull: Negative for fracture or focal lesion. Sinuses/Orbits: Visualized portions of the orbits are unremarkable. Mastoid sinuses are clear. Prior turbinate resection. Other: None. IMPRESSION: 1. No acute intracranial pathology. 2. Chronic microvascular disease and cerebral atrophy. Electronically Signed   By: Elige Ko   On: 03/02/2017 12:49   US Carotid Bilateral (at [facility] And [facility] Only)  Result Date: 03/02/2017 CLINICAL DATA:  Acute encephalopathy. History of hypertension and diabetes. EXAM: BILATERAL CAROTID DUPLEX ULTRASOUND TECHNIQUE: Wallace Cullens scale imaging, color Doppler and duplex ultrasound were performed of bilateral carotid and vertebral arteries in the neck. COMPARISON:  None. FINDINGS: Criteria: Quantification of carotid stenosis is based on velocity parameters that correlate the residual internal carotid diameter with NASCET-based stenosis levels, using the diameter of the distal internal carotid lumen as the denominator for stenosis measurement. The following velocity measurements were obtained: RIGHT ICA:  74/15 cm/sec CCA:  75/12 cm/sec SYSTOLIC ICA/CCA RATIO:  1.0 DIASTOLIC ICA/CCA RATIO:  1.2 ECA:  64 cm/sec LEFT ICA:  Unable to be obtained due to patient's combative behavior CCA:  Unable to be obtained due to patient's combative behavior ECA:  Unable to be obtained due to patient's combative behavior RIGHT CAROTID ARTERY: There is a moderate amount of eccentric echogenic plaque involving the origin and proximal aspects of the right  internal carotid artery (image 24), not resulting in elevated peak systolic velocities within the interrogated course the right internal carotid artery to suggest a hemodynamically significant stenosis. RIGHT VERTEBRAL ARTERY:  Antegrade flow LEFT CAROTID ARTERY: Not imaged due to patient's combative behavior. LEFT VERTEBRAL ARTERY: Not imaged due to patient's combative behavior. IMPRESSION: 1. Moderate amount of right-sided atherosclerotic plaque, not definitely resulting in a hemodynamically significant stenosis. 2. The left carotid system was not evaluated due to patient's combative behavior and inability to tolerate the examination. Electronically Signed   By: Simonne Come M.D.   On: 03/02/2017 16:46     ASSESSMENT AND PLAN:   83 y/o female with dementia. Chronic kidney disease stage III who presents with agitation and noted have facial asymmetry.  1. Acute metabolic encephalopathy: Patient is being evaluated for CVA workup. Follow-up on CVA workup No signs of infection Follow up on neurology consultation  2. Diarrhea: Patient had no bowel movements overnight. Can probably DC anterior precautions as patient has had no diarrhea since hospitalization. Continue lactobacillus  3. Acute kidney injury on chronic kidney disease stage III: Awaiting this morning's labs  4. Hyperkalemia: This seems to have improved yesterday after treatment 5. Elevated troponin: This is due to demand ischemia/poor renal clearance and not ACS.  6. Diabetes: Continue sliding scale  7. Dementia: Continue with sitter for now  8. Essential hypertension: Continue Coreg  9. Hyperlipidemia: Continue statin    Management plans discussed with the patient and she is in agreement.  CODE STATUS: FULL  TOTAL TIME TAKING CARE OF THIS PATIENT: 30 minutes.     POSSIBLE D/C tomorrow, DEPENDING ON CLINICAL CONDITION.   Cataleyah Colborn M.D on 03/03/2017 at 10:25 AM  Between 7am to 6pm - Pager -  [phone] After 6pm  go to [email or web address] - password [email or web address]  [facility]  Office  [phone]  CC: Primary care physician; Macy MisBriscoe, Kim K, MD  Note: This dictation was prepared with Dragon dictation along with smaller phrase technology. Any transcriptional errors that result from this process are unintentional.

## 2017-03-03 NOTE — Progress Notes (Signed)
Difficult to assess patient r/t periods of combativeness and lethargy.  However, patient did express once that she needed to go to the bathroom.  With the help of her sitter, we got her up to the bedside commode.  She was a little unsteady, but did urinate.

## 2017-03-03 NOTE — Evaluation (Signed)
Occupational Therapy Evaluation Patient Details Name: Monica CageBarbara Choi MRN: [id number] DOB: 03/22/1934 Today's Date: 03/03/2017    History of Present Illness Pt. is an 83 y.o. female who was [facility] to [facility] with Acute Encephalopathy, Dementa, CKD Stage III, Pacemaker secondary to Sick Sinus Syndrome, Anemia, Anxiety, DM, Diverticulitis, GERD, HTN, Cardiac Pacemaker, Rhabdomyolysis, TIA, and Renal Disorder   Clinical Impression   Pt. Is an 83 y.o. Female who [facility] with Acute Encephalopathy, Dementia, CKD stage lll, Pacemaker, Sick Sinus Syndrome, Anemia, Anxiety, DM, Diverticulitis, GERD, HTN, Rhabdomyolysis, TIA, and Renal Disorder. Pt. Was cleared by nursing to work with OT, however pt. declined OOB activity. Pt. was assessed at bedside  Pt. presents with weakness, fatigue, right shoulder limitations, cognitive impairments, and limited functional mobility which hinder her ability to complete ADL tasks, and IADL tasks.[facility], and was independent with ADLs, used a rollator walker, was active, attended activities, and ate meals in the dining room.  Pt. Could benefit from skilled OT services for ADL, and IADL training, UE there. Ex, and cognitive compensatory strategies in order to return to her PLOF. Pt. Could benefit from follow-up OT services upon discharge.     Follow Up Recommendations  SNF    Equipment Recommendations       Recommendations for Other Services       Precautions / Restrictions                                                      ADL either performed or assessed with clinical judgement   ADL Overall ADL's : Needs assistance/impaired Eating/Feeding: Set up;Bed level   Grooming: Set up;Minimal assistance;Bed level   Upper Body Bathing: Minimal assistance;Bed level   Lower Body Bathing: Moderate assistance;Bed level   Upper Body Dressing : Minimal assistance;Bed level   Lower Body Dressing: Moderate  assistance;Bed level                 General ADL Comments: Pt. declined OOB assessment     Vision Baseline Vision/History: Wears glasses(Pt. reports she lost her glasses at [facility])       Perception     Praxis      Pertinent Vitals/Pain Pain Assessment: No/denies pain     Hand Dominance Left   Extremity/Trunk Assessment Upper Extremity Assessment Upper Extremity Assessment: Generalized weakness;RUE deficits/detail RUE Deficits / Details: Limted Right shoulder ROM from an old injury.           Communication Communication Communication: Expressive difficulties   Cognition Arousal/Alertness: Awake/alert Behavior During Therapy: WFL for tasks assessed/performed Overall Cognitive Status: Within Functional Limits for tasks assessed                                     General Comments       Exercises     Shoulder Instructions      Home Living Family/patient expects to be discharged to:: Assisted living                             Home Equipment: Walker - 4 wheels          Prior Functioning/Environment Level of Independence: Independent with assistive device(s)  OT Problem List: Decreased strength;Decreased range of motion;Decreased knowledge of use of DME or AE      OT Treatment/Interventions: Self-care/ADL training;Therapeutic exercise;DME and/or AE instruction;Patient/family education    OT Goals(Current goals can be found in the care plan section) Acute Rehab OT Goals Patient Stated Goal: To return home OT Goal Formulation: With patient Potential to Achieve Goals: Good  OT Frequency: Min 1X/week   Barriers to D/C:            Co-evaluation              AM-PAC PT "6 Clicks" Daily Activity     Outcome Measure Help from another person eating meals?: None Help from another person taking care of personal grooming?: A Little Help from another person toileting, which includes using  toliet, bedpan, or urinal?: A Lot Help from another person bathing (including washing, rinsing, drying)?: A Lot Help from another person to put on and taking off regular upper body clothing?: A Little Help from another person to put on and taking off regular lower body clothing?: A Lot 6 Click Score: 16   End of Session    Activity Tolerance: Patient tolerated treatment well;Patient limited by fatigue Patient left: in bed;with call bell/phone within reach;with bed alarm set;with family/visitor present;with nursing/sitter in room  OT Visit Diagnosis: Muscle weakness (generalized) (M62.81)                Time: 1320-1340 OT Time Calculation (min): 20 min Charges:  OT General Charges $OT Visit: 1 Visit OT Evaluation $OT Eval Moderate Complexity: 1 Mod G-Codes: OT G-codes **NOT FOR INPATIENT CLASS** Functional Assessment Tool Used: AM-PAC 6 Clicks Daily Activity Functional Limitation: Other OT subsequent Other OT Secondary Current Status (Z6109(G8993): At least 40 percent but less than 60 percent impaired, limited or restricted Other OT Secondary Goal Status (U0454(G8994): At least 1 percent but less than 20 percent impaired, limited or restricted   Olegario MessierElaine Reiko Vinje, MS, OTR/L   Olegario MessierElaine Anikka Marsan, MS, OTR/L 03/03/2017, 2:53 PM

## 2017-03-04 ENCOUNTER — Other Ambulatory Visit: Payer: Self-pay | Admitting: *Deleted

## 2017-03-04 DIAGNOSIS — E860 Dehydration: Principal | ICD-10-CM

## 2017-03-04 DIAGNOSIS — I10 Essential (primary) hypertension: Secondary | ICD-10-CM

## 2017-03-04 LAB — BASIC METABOLIC PANEL
Anion gap: 7 (ref 5–15)
BUN: 34 mg/dL — ABNORMAL HIGH (ref 6–20)
CALCIUM: 9.1 mg/dL (ref 8.9–10.3)
CO2: 20 mmol/L — AB (ref 22–32)
Chloride: 111 mmol/L (ref 101–111)
Creatinine, Ser: 1.45 mg/dL — ABNORMAL HIGH (ref 0.44–1.00)
GFR, EST AFRICAN AMERICAN: 38 mL/min — AB (ref >60)
GFR, EST NON AFRICAN AMERICAN: 33 mL/min — AB (ref >60)
Glucose, Bld: 115 mg/dL — ABNORMAL HIGH (ref 65–99)
Potassium: 4.2 mmol/L (ref 3.5–5.1)
Sodium: 138 mmol/L (ref 135–145)

## 2017-03-04 LAB — ECHOCARDIOGRAM COMPLETE
HEIGHTINCHES: 66 in
WEIGHTICAEL: 2304 [oz_av]

## 2017-03-04 LAB — GLUCOSE, CAPILLARY
GLUCOSE-CAPILLARY: 110 mg/dL — AB (ref 65–99)
Glucose-Capillary: 121 mg/dL — ABNORMAL HIGH (ref 65–99)

## 2017-03-04 LAB — RPR
RPR Ser Ql: NONREACTIVE
RPR Ser Ql: NONREACTIVE

## 2017-03-04 MED ORDER — FLORANEX PO PACK: 1.0000 g | PACK | Freq: Three times a day (TID) | ORAL | 0 refills | Status: AC

## 2017-03-04 MED ORDER — FLORANEX PO PACK: 1.0000 g | PACK | Freq: Three times a day (TID) | ORAL | 0 refills | Status: DC

## 2017-03-04 NOTE — Progress Notes (Signed)
Patient is medically stable for D/C back to [facility] today. Per med tech Tameka patient can return today. Clinical Child psychotherapistocial Worker (CSW) sent D/C summary and FL2 to [facility]. RN will call report. Patient's granddaughter Mardella LaymanLindsey will transport patient around 12 noon today. Mardella LaymanLindsey is aware of above. Please reconsult if future social work needs arise. CSW signing off.   Baker Hughes IncorporatedBailey Dashawna Delbridge, LCSW [phone]

## 2017-03-04 NOTE — Progress Notes (Signed)
Subjective: Patient sitting in chair.  Appears at baseline.    Objective: Current vital signs: BP 132/66 (BP Location: Right Arm)   Pulse (!) 55   Temp (!) 97.4 F (36.3 C) (Oral)   Resp 14   Ht 5\' 6"  (1.676 m)   Wt 65.3 kg (144 lb)   SpO2 93%   BMI 23.24 kg/m  Vital signs in last 24 hours: Temp:  [97.4 F (36.3 C)-98.1 F (36.7 C)] 97.4 F (36.3 C) (11/28 0502) Pulse Rate:  [55-80] 55 (11/28 0502) Resp:  [14-16] 14 (11/28 0502) BP: (132-177)/(55-77) 132/66 (11/28 0502) SpO2:  [91 %-94 %] 93 % (11/28 0502)  Intake/Output from previous day: 11/27 0701 - 11/28 0700 In: 2121 [I.V.:2121] Out: -  Intake/Output this shift: Total I/O In: 120 [P.O.:120] Out: -  Nutritional status: DIET DYS 3 Room service appropriate? Yes with Assist; Fluid consistency: Thin Diet - low sodium heart healthy  Neurologic Exam: Mental Status: Alert, oriented to person, place and time.  Cooperative, thought content appropriate.  Speech fluent without evidence of aphasia.  Needs extensive reinforcement to follow 3 step commands. Cranial Nerves: II: Discs flat bilaterally; Visual fields grossly normal, pupils equal, round, reactive to light and accommodation III,IV, VI: ptosis not present, extra-ocular motions intact bilaterally V,VII: smile symmetric, facial light touch sensation normal bilaterally VIII: hearing normal bilaterally IX,X: gag reflex present XI: bilateral shoulder shrug XII: midline tongue extension Motor: Lifts all extremities against gravity Sensory: Pinprick and light touch intact throughout, bilaterally   Lab Results: Basic Metabolic Panel: Recent Labs  Lab 03/02/17 1043 03/02/17 1945 03/03/17 0543 03/04/17 0426  NA 137  --  139 138  K 6.0* 4.5 4.2 4.2  CL 110  --  109 111  CO2 17*  --  20* 20*  GLUCOSE 151*  --  123* 115*  BUN 39*  --  31* 34*  CREATININE 1.59*  --  1.22* 1.45*  CALCIUM 9.8  --  9.3 9.1    Liver Function Tests: Recent Labs  Lab  03/02/17 1043  AST 30  ALT 16  ALKPHOS 86  BILITOT 1.0  PROT 7.5  ALBUMIN 4.2   No results for input(s): LIPASE, AMYLASE in the last 168 hours. Recent Labs  Lab 03/02/17 1945 03/02/17 2344  AMMONIA 27 29    CBC: Recent Labs  Lab 03/02/17 1043  WBC 9.3  NEUTROABS 6.9*  HGB 13.5  HCT 40.5  MCV 92.8  PLT 197    Cardiac Enzymes: Recent Labs  Lab 03/02/17 1043 03/02/17 1945 03/02/17 2344 03/03/17 0543  TROPONINI <0.03 0.03* 0.05* 0.05*    Lipid Panel: Recent Labs  Lab 03/03/17 0543  CHOL 265*  TRIG 184*  HDL 39*  CHOLHDL 6.8  VLDL 37  LDLCALC 161189*    CBG: Recent Labs  Lab 03/03/17 0754 03/03/17 1134 03/03/17 1720 03/03/17 2014 03/04/17 0749  GLUCAP 124* 127* 92 138* 121*    Microbiology: Results for orders placed or performed during the hospital encounter of 03/02/17  Urine culture     Status: Abnormal   Collection Time: 03/02/17 11:25 AM  Result Value Ref Range Status   Specimen Description URINE, RANDOM  Final   Special Requests NONE  Final   Culture MULTIPLE SPECIES PRESENT, SUGGEST RECOLLECTION (A)  Final   Report Status 03/03/2017 FINAL  Final  MRSA PCR Screening     Status: None   Collection Time: 03/02/17  5:14 PM  Result Value Ref Range Status   MRSA by  PCR NEGATIVE NEGATIVE Final    Comment:        The GeneXpert MRSA Assay (FDA approved for NASAL specimens only), is one component of a comprehensive MRSA colonization surveillance program. It is not intended to diagnose MRSA infection nor to guide or monitor treatment for MRSA infections.   Gastrointestinal Panel by PCR , Stool     Status: None   Collection Time: 03/03/17 11:51 AM  Result Value Ref Range Status   Campylobacter species NOT DETECTED NOT DETECTED Final   Plesimonas shigelloides NOT DETECTED NOT DETECTED Final   Salmonella species NOT DETECTED NOT DETECTED Final   Yersinia enterocolitica NOT DETECTED NOT DETECTED Final   Vibrio species NOT DETECTED NOT  DETECTED Final   Vibrio cholerae NOT DETECTED NOT DETECTED Final   Enteroaggregative E coli (EAEC) NOT DETECTED NOT DETECTED Final   Enteropathogenic E coli (EPEC) NOT DETECTED NOT DETECTED Final   Enterotoxigenic E coli (ETEC) NOT DETECTED NOT DETECTED Final   Shiga like toxin producing E coli (STEC) NOT DETECTED NOT DETECTED Final   Shigella/Enteroinvasive E coli (EIEC) NOT DETECTED NOT DETECTED Final   Cryptosporidium NOT DETECTED NOT DETECTED Final   Cyclospora cayetanensis NOT DETECTED NOT DETECTED Final   Entamoeba histolytica NOT DETECTED NOT DETECTED Final   Giardia lamblia NOT DETECTED NOT DETECTED Final   Adenovirus F40/41 NOT DETECTED NOT DETECTED Final   Astrovirus NOT DETECTED NOT DETECTED Final   Norovirus GI/GII NOT DETECTED NOT DETECTED Final   Rotavirus A NOT DETECTED NOT DETECTED Final   Sapovirus (I, II, IV, and V) NOT DETECTED NOT DETECTED Final  C difficile quick scan w PCR reflex     Status: None   Collection Time: 03/03/17 11:51 AM  Result Value Ref Range Status   C Diff antigen NEGATIVE NEGATIVE Final   C Diff toxin NEGATIVE NEGATIVE Final   C Diff interpretation No C. difficile detected.  Final    Coagulation Studies: Recent Labs    03/02/17 1945  LABPROT 12.4  INR 0.93    Imaging: Ct Head Wo Contrast  Result Date: 03/03/2017 CLINICAL DATA:  83 year old female with altered mental status. EXAM: CT HEAD WITHOUT CONTRAST TECHNIQUE: Contiguous axial images were obtained from the base of the skull through the vertex without intravenous contrast. COMPARISON:  03/02/2017 and prior exams FINDINGS: Brain: No evidence of acute infarction, hemorrhage, hydrocephalus, extra-axial collection or mass lesion/mass effect. Atrophy and chronic small-vessel white matter ischemic changes again noted. Vascular: Intracranial atherosclerotic vascular calcifications are present. Skull: Normal. Negative for fracture or focal lesion. Sinuses/Orbits: No acute finding. Other: None.  IMPRESSION: 1. No evidence of acute intracranial abnormality 2. Atrophy and chronic small-vessel white matter ischemic changes. Electronically Signed   By: Harmon PierJeffrey  Hu M.D.   On: 03/03/2017 12:42   Ct Head Wo Contrast  Result Date: 03/02/2017 CLINICAL DATA:  Altered level consciousness EXAM: CT HEAD WITHOUT CONTRAST TECHNIQUE: Contiguous axial images were obtained from the base of the skull through the vertex without intravenous contrast. COMPARISON:  01/18/2017 FINDINGS: Brain: No evidence of acute infarction, hemorrhage, extra-axial collection, ventriculomegaly, or mass effect. Generalized cerebral atrophy. Periventricular white matter low attenuation likely secondary to microangiopathy. Vascular: Cerebrovascular atherosclerotic calcifications are noted. Skull: Negative for fracture or focal lesion. Sinuses/Orbits: Visualized portions of the orbits are unremarkable. Mastoid sinuses are clear. Prior turbinate resection. Other: None. IMPRESSION: 1. No acute intracranial pathology. 2. Chronic microvascular disease and cerebral atrophy. Electronically Signed   By: Elige KoHetal  Patel   On: 03/02/2017 12:49  US Carotid Bilateral (at [facility] Only)  Result Date: 03/02/2017 CLINICAL DATA:  Acute encephalopathy. History of hypertension and diabetes. EXAM: BILATERAL CAROTID DUPLEX ULTRASOUND TECHNIQUE: Wallace Cullens scale imaging, color Doppler and duplex ultrasound were performed of bilateral carotid and vertebral arteries in the neck. COMPARISON:  None. FINDINGS: Criteria: Quantification of carotid stenosis is based on velocity parameters that correlate the residual internal carotid diameter with NASCET-based stenosis levels, using the diameter of the distal internal carotid lumen as the denominator for stenosis measurement. The following velocity measurements were obtained: RIGHT ICA:  74/15 cm/sec CCA:  75/12 cm/sec SYSTOLIC ICA/CCA RATIO:  1.0 DIASTOLIC ICA/CCA RATIO:  1.2 ECA:  64 cm/sec LEFT ICA:  Unable to be  obtained due to patient's combative behavior CCA:  Unable to be obtained due to patient's combative behavior ECA:  Unable to be obtained due to patient's combative behavior RIGHT CAROTID ARTERY: There is a moderate amount of eccentric echogenic plaque involving the origin and proximal aspects of the right internal carotid artery (image 24), not resulting in elevated peak systolic velocities within the interrogated course the right internal carotid artery to suggest a hemodynamically significant stenosis. RIGHT VERTEBRAL ARTERY:  Antegrade flow LEFT CAROTID ARTERY: Not imaged due to patient's combative behavior. LEFT VERTEBRAL ARTERY: Not imaged due to patient's combative behavior. IMPRESSION: 1. Moderate amount of right-sided atherosclerotic plaque, not definitely resulting in a hemodynamically significant stenosis. 2. The left carotid system was not evaluated due to patient's combative behavior and inability to tolerate the examination. Electronically Signed   By: Simonne Come M.D.   On: 03/02/2017 16:46    Medications: I have reviewed the patient's current medications. Scheduled:  Assessment/Plan: Patient doing well.  No new complaints.    No further neurologic intervention is recommended at this time.  If further questions arise, please call or page at that time.  Thank you for allowing neurology to participate in the care of this patient.    LOS: 2 days   Thana Farr, MD Neurology [phone] 03/04/2017  12:22 PM

## 2017-03-04 NOTE — Evaluation (Signed)
Physical Therapy Evaluation Patient Details Name: Monica CageBarbara Fogel MRN: [id number] DOB: 10/19/1933 Today's Date: 03/04/2017   History of Present Illness  Pt. is an 83 y.o. female who was [facility] to [facility] with Acute Encephalopathy, Dementa, CKD Stage III, Pacemaker secondary to Sick Sinus Syndrome, Anemia, Anxiety, DM, Diverticulitis, GERD, HTN, Cardiac Pacemaker, Rhabdomyolysis, TIA, and Renal Disorder  Clinical Impression  Pt is a pleasant 83 year old feamle, intermittently confused who was admitted for acute encephalopathy. Pt performs bed mobility/transfers with supervision and ambulation with cga and RW. Pt demonstrates deficits with endurance/mobility/balance. Pt appears back to baseline with mobility and cognition. Would benefit from skilled PT to address above deficits and promote optimal return to PLOF. Recommend transition to HHPT upon discharge from acute hospitalization.      Follow Up Recommendations (return back to ALF with HHPT)    Equipment Recommendations  None recommended by PT    Recommendations for Other Services       Precautions / Restrictions Precautions Precautions: Fall Restrictions Weight Bearing Restrictions: No      Mobility  Bed Mobility Overal bed mobility: Needs Assistance Bed Mobility: Supine to Sit     Supine to sit: Supervision     General bed mobility comments: safe technique performed with ability to sit at EOB safely.  Transfers Overall transfer level: Needs assistance Equipment used: Rolling walker (2 wheeled) Transfers: Sit to/from Stand Sit to Stand: Supervision         General transfer comment: Pt able to stand with safe technique using RW  Ambulation/Gait Ambulation/Gait assistance: Min guard Ambulation Distance (Feet): 200 Feet Assistive device: Rolling walker (2 wheeled) Gait Pattern/deviations: Step-through pattern     General Gait Details: ambulated using RW and safe technique. Pt tends to stop and talk, needs cues to  decrease distractions. Reciprocal gait pattern performed. No LOB noted.  Stairs            Wheelchair Mobility    Modified Rankin (Stroke Patients Only)       Balance Overall balance assessment: Needs assistance Sitting-balance support: Feet supported Sitting balance-Leahy Scale: Good     Standing balance support: Bilateral upper extremity supported Standing balance-Leahy Scale: Good                               Pertinent Vitals/Pain Pain Assessment: No/denies pain    Home Living Family/patient expects to be discharged to:: Assisted living               Home Equipment: Walker - 4 wheels Additional Comments: has [facility] living [facility]    Prior Function Level of Independence: Independent with assistive device(s)         Comments: reports she typically uses her RW     Hand Dominance        Extremity/Trunk Assessment   Upper Extremity Assessment Upper Extremity Assessment: Overall WFL for tasks assessed    Lower Extremity Assessment Lower Extremity Assessment: Overall WFL for tasks assessed       Communication   Communication: No difficulties  Cognition Arousal/Alertness: Awake/alert Behavior During Therapy: WFL for tasks assessed/performed Overall Cognitive Status: Within Functional Limits for tasks assessed                                        General Comments      Exercises  Assessment/Plan    PT Assessment Patient needs continued PT services  PT Problem List Decreased activity tolerance;Decreased mobility       PT Treatment Interventions Gait training;Therapeutic exercise;Therapeutic activities;Balance training    PT Goals (Current goals can be found in the Care Plan section)  Acute Rehab PT Goals Patient Stated Goal: To return home PT Goal Formulation: With patient Time For Goal Achievement: 03/18/17 Potential to Achieve Goals: Good    Frequency Min 2X/week   Barriers to  discharge        Co-evaluation               AM-PAC PT "6 Clicks" Daily Activity  Outcome Measure Difficulty turning over in bed (including adjusting bedclothes, sheets and blankets)?: None Difficulty moving from lying on back to sitting on the side of the bed? : None Difficulty sitting down on and standing up from a chair with arms (e.g., wheelchair, bedside commode, etc,.)?: A Little Help needed moving to and from a bed to chair (including a wheelchair)?: A Little Help needed walking in hospital room?: A Little Help needed climbing 3-5 steps with a railing? : A Little 6 Click Score: 20    End of Session Equipment Utilized During Treatment: Gait belt Activity Tolerance: Patient tolerated treatment well Patient left: in chair;with chair alarm set Nurse Communication: Mobility status PT Visit Diagnosis: Muscle weakness (generalized) (M62.81);Difficulty in walking, not elsewhere classified (R26.2)    Time: [id number] PT Time Calculation (min) (ACUTE ONLY): 20 min   Charges:   PT Evaluation $PT Eval Low Complexity: 1 Low PT Treatments $Gait Training: 8-22 mins   PT G Codes:   PT G-Codes **NOT FOR INPATIENT CLASS** Functional Assessment Tool Used: AM-PAC 6 Clicks Basic Mobility Functional Limitation: Mobility: Walking and moving around Mobility: Walking and Moving Around Current Status (V4098(G8978): At least 20 percent but less than 40 percent impaired, limited or restricted Mobility: Walking and Moving Around Goal Status [phone](G8979): At least 1 percent but less than 20 percent impaired, limited or restricted    Elizabeth PalauStephanie Massimiliano Rohleder, PT, DPT [phone]   Jashae Wiggs 03/04/2017, 1:10 PM

## 2017-03-04 NOTE — Patient Outreach (Signed)
[facility]) Care Management  03/04/2017  Monica CageBarbara Daleo 01/23/1934 [id number]   This patient was referred to this social worker to follow while in rehab at [facility]. Patient contacted and this social worker was told that she was discharged from [facility] today. Patient states that she was hospitalized for dehydration. Patient explained that she was not aware that she was not drinking enough fluids but plans now to increase her intake. Patient states that the staff at [facility] check on her regularly, however her 2 granddaughters are also active in her care and manage her medication . Patient reported having no community resource needs, however states that she would welcome a visit from a nurse. She is not sure if [facility] by [facility].   Plan: This Child psychotherapistsocial worker will [facility] to follow up with patient for transition of care.   Adriana ReamsChrystal Land, LCSW [facility] Care Management [phone]

## 2017-03-04 NOTE — Care Management Important Message (Signed)
Important Message  Patient Details  Name: [name] MRN: [id number] Date of Birth: 02/17/1934   Medicare Important Message Given:  Yes    Gwenette GreetBrenda S Brittney Mucha, RN 03/04/2017, 7:07 AM

## 2017-03-04 NOTE — Plan of Care (Signed)
Pt is being d/ced back to [facility].  She was admitted for AMS, dehydration and diarrhea.  Pt appears back to baseline.  She is A&O w/some memory impairment.  She worked with PT and is steady on her feet.  She's no longer combative and doesn't remember the event that brought her here.  No longer having diarrhea.  She was Cdiff negative, GI panel negative.  She was negative for stroke.  [facility] [place] [facility].  Her granddaughter, Mardella LaymanLindsey will transport her home.  I did talk to her granddaughters that she may ultimately need increased level of care - she's having more falls and more incontinent episodes.  Will review d/c with granddaughter and IV was never replaced after it infiltrated last night.

## 2017-03-04 NOTE — NC FL2 (Signed)
[place] MEDICAID FL2 LEVEL OF CARE SCREENING TOOL     IDENTIFICATION  Patient Name: Monica CageBarbara Choi Birthdate: 04/23/1933 Sex: female Admission Date (Current Location): 03/02/2017  [place]   Facility and Address:  [facility], [address], [place], [place] [address]      Provider Number: [id number]  Attending Physician Name and Address:  Adrian SaranMody, Sital, MD  Relative Name and Phone Number:       Current Level of Care: Hospital Recommended Level of Care: Assisted Living Facility Prior Approval Number:    Date Approved/Denied:   PASRR Number:   Discharge Plan: Domiciliary (Rest home)    Current Diagnoses: Patient Active Problem List   Diagnosis Date Noted  . Encephalopathy 03/02/2017  . Rhabdomyolysis 11/25/2016  . Nonintractable headache   . TIA (transient ischemic attack) 09/17/2016  . Hypertension 07/17/2016  . Sick sinus syndrome (HCC) 07/17/2016    Orientation RESPIRATION BLADDER Height & Weight     Self, Time  Normal Incontinent Weight: 144 lb (65.3 kg) Height:  5\' 6"  (167.6 cm)  BEHAVIORAL SYMPTOMS/MOOD NEUROLOGICAL BOWEL NUTRITION STATUS      Incontinent Diet(Dysphagia 3 (w/ minced meats) w/ thin liquids; aspiration precautions.)  AMBULATORY STATUS COMMUNICATION OF NEEDS Skin   Limited Assist Verbally Normal                       Personal Care Assistance Level of Assistance  Bathing, Feeding, Dressing Bathing Assistance: Limited assistance Feeding assistance: Independent Dressing Assistance: Limited assistance     Functional Limitations Info  Sight, Hearing, Speech Sight Info: Adequate Hearing Info: Impaired Speech Info: Adequate    SPECIAL CARE FACTORS FREQUENCY  PT (By licensed PT)     PT Frequency: (2-3 home health )              Contractures      Additional Factors Info  Code Status, Allergies Code Status Info: (Full Code. ) Allergies Info: (Morphine, Sulfa  Antibiotics, Amoxicillin, Benzonatate, Buprenorphine, Fish-derived Products, Penicillins, Ciprofloxacin, Morphine and related. )          Discharge Medications: Please see discharge summary for a list of discharge medications. Current Discharge Medication List        START taking these medications   Details  lactobacillus (FLORANEX/LACTINEX) PACK Take 1 packet (1 g total) by mouth 3 (three) times daily with meals. Qty: 90 packet, Refills: 0          CONTINUE these medications which have NOT CHANGED   Details  acetaminophen (TYLENOL) 500 MG tablet Take 500 mg by mouth every 8 (eight) hours as needed.    aspirin EC 81 MG EC tablet Take 1 tablet (81 mg total) by mouth daily. Qty: 30 tablet, Refills: 0    carvedilol (COREG) 12.5 MG tablet Take 1 tablet (12.5 mg total) by mouth 2 (two) times daily. Qty: 60 tablet, Refills: 11    Cholecalciferol (VITAMIN D) 2000 units CAPS Take 2,000 Units by mouth daily.     DULoxetine (CYMBALTA) 30 MG capsule Take 30 mg by mouth 2 (two) times daily.    metFORMIN (GLUCOPHAGE-XR) 500 MG 24 hr tablet Take 500 mg by mouth daily with breakfast.    pantoprazole (PROTONIX) 20 MG tablet Take 20 mg by mouth daily.    ramipril (ALTACE) 5 MG capsule Take 1 capsule (5 mg total) by mouth daily. Qty: 7 capsule, Refills: 0    loratadine (CLARITIN) 10 MG tablet Take 10  mg by mouth daily as needed for allergies.    metoCLOPramide (REGLAN) 10 MG tablet Take 1 tablet (10 mg total) by mouth every 6 (six) hours as needed (for nausea/headache). Qty: 12 tablet, Refills: 0    nitroGLYCERIN (NITROSTAT) 0.4 MG SL tablet Place 0.4 mg under the tongue every 5 (five) minutes as needed for chest pain.    traMADol (ULTRAM) 50 MG tablet Take 1 tablet (50 mg total) by mouth every 12 (twelve) hours as needed for severe pain. Qty: 30 tablet         STOP taking these medications     celecoxib (CELEBREX) 100 MG capsule     Relevant Imaging  Results: Relevant Lab Results: Additional Information (SSN: [id number])  Monica Choi, Monica CrockerBailey M, LCSW

## 2017-03-04 NOTE — Consult Note (Signed)
   [facility] CM Inpatient Consult   03/04/2017  Monica Choi Jan 02, 1934 [id number]   Chart review revealed patient eligible for [place] Management services and post hospital discharge follow up related to multiple admits and ED visits in the last 6 months and . Patient was evaluated for community based chronic disease management services with [facility] care Management Program as a benefit of patient's [place]. Met with the patient and her granddaughter Monica Choi at the bedside to explain [place] Management services. Patient endorses her primary residence is [facility] assisted living.  Consent form signed. Patient gave [phone] as the best number to reach her. She also gave written permission to call her granddaughter Monica Choi at [phone] if she/he can not be reached. Patient will receive post hospital discharge visits by [facility] Social Worker at [facility] facility. [facility] Care Management services does not interfere with or replace any services arranged by the inpatient care management team. RNCM left contact information and [facility] literature at the bedside. Made inpatient RNCM aware that [facility] will be following for care management. For additional questions please contact:   Montez Stryker RN, [facility] Liaison  [phone]) Business Mobile [phone]) Toll free office

## 2017-03-04 NOTE — Discharge Summary (Signed)
[employer] [facility] [facility] [place] at [facility]   PATIENT NAME: Monica Choi    MR#:  [id number]  DATE OF BIRTH:  11-Feb-1934  DATE OF ADMISSION:  03/02/2017 ADMITTING PHYSICIAN: Bertrum Sol, MD  DATE OF DISCHARGE: 03/04/2017  PRIMARY CARE PHYSICIAN: Macy Mis, MD    ADMISSION DIAGNOSIS:  Dehydration [E86.0] Delirium [R41.0] Diarrhea, unspecified type [R19.7]  DISCHARGE DIAGNOSIS:  Active Problems:   Encephalopathy   SECONDARY DIAGNOSIS:   Past Medical History:  Diagnosis Date  . Anemia   . Anxiety   . Arrhythmia   . Diabetes mellitus without complication (HCC)   . Diverticulitis   . GERD (gastroesophageal reflux disease)   . Hypertension   . Presence of permanent cardiac pacemaker    hx/notes 11/25/2016  . Rectal prolapse   . Renal disorder   . Rhabdomyolysis 11/2016  . Sick sinus syndrome (HCC)    hx/notes 11/25/2016  . Sinus node dysfunction (HCC)   . TIA (transient ischemic attack) 07/2016   hx/notes 11/25/2016    HOSPITAL COURSE:   83 y/o female with dementia. Chronic kidney disease stage III who presents with agitation and noted have facial asymmetry.  1. Acute metabolic encephalopathy: The etiology of acute metabolic encephalopathy is due to dehydration. She was evaluated by neurology. She underwent a repeat CT the head which was negative. She also underwent carotid Dopplers showed no  hemodynamically significant stenosis.  Echocardiogram results are pending at the time of this discharge and we ask PCP to follow up on echocardiogram.   2. Diarrhea: Her GI panel and C. difficile were negative. It is likely that she had a viral etiology for diarrhea. Diarrhea has improved.  3. Acute kidney injury on chronic kidney disease stage III: Creatinine has improved with IV fluids. 4. Hyperkalemia: Thisimproved after treatment 5. Elevated troponin: This is due to demand ischemia/poor renal clearance and not ACS.  6. Diabetes: Continue outpatient  metformin dose. 7. Dementia: Initially she had some confusion and there was a sitter however she is now back to her baseline.  8. Essential hypertension: Continue Coreg  9. Hyperlipidemia: Continue statin      DISCHARGE CONDITIONS AND DIET:   Stable for discharge on diabetic diet  CONSULTS OBTAINED:  Treatment Team:  Thana Farr, MD  DRUG ALLERGIES:   Allergies  Allergen Reactions  . Morphine Nausea And Vomiting  . Sulfa Antibiotics Anaphylaxis and Rash  . Amoxicillin Other (See Comments)  . Benzonatate Other (See Comments)  . Buprenorphine Nausea And Vomiting  . Fish-Derived Products Nausea Only  . Penicillins Hives    Has patient had a PCN reaction causing immediate rash, facial/tongue/throat swelling, SOB or lightheadedness with hypotension: Yes Has patient had a PCN reaction causing severe rash involving mucus membranes or skin necrosis: Unknown Has patient had a PCN reaction that required hospitalization: Unknown Has patient had a PCN reaction occurring within the last 10 years: No If all of the above answers are "NO", then may proceed with Cephalosporin use.   . Ciprofloxacin Itching and Rash  . Morphine And Related Nausea And Vomiting    DISCHARGE MEDICATIONS:   Current Discharge Medication List    START taking these medications   Details  lactobacillus (FLORANEX/LACTINEX) PACK Take 1 packet (1 g total) by mouth 3 (three) times daily with meals. Qty: 90 packet, Refills: 0      CONTINUE these medications which have NOT CHANGED   Details  acetaminophen (TYLENOL) 500 MG tablet Take 500 mg by mouth every  8 (eight) hours as needed.    aspirin EC 81 MG EC tablet Take 1 tablet (81 mg total) by mouth daily. Qty: 30 tablet, Refills: 0    carvedilol (COREG) 12.5 MG tablet Take 1 tablet (12.5 mg total) by mouth 2 (two) times daily. Qty: 60 tablet, Refills: 11    Cholecalciferol (VITAMIN D) 2000 units CAPS Take 2,000 Units by mouth daily.      DULoxetine (CYMBALTA) 30 MG capsule Take 30 mg by mouth 2 (two) times daily.    metFORMIN (GLUCOPHAGE-XR) 500 MG 24 hr tablet Take 500 mg by mouth daily with breakfast.    pantoprazole (PROTONIX) 20 MG tablet Take 20 mg by mouth daily.    ramipril (ALTACE) 5 MG capsule Take 1 capsule (5 mg total) by mouth daily. Qty: 7 capsule, Refills: 0    loratadine (CLARITIN) 10 MG tablet Take 10 mg by mouth daily as needed for allergies.    metoCLOPramide (REGLAN) 10 MG tablet Take 1 tablet (10 mg total) by mouth every 6 (six) hours as needed (for nausea/headache). Qty: 12 tablet, Refills: 0    nitroGLYCERIN (NITROSTAT) 0.4 MG SL tablet Place 0.4 mg under the tongue every 5 (five) minutes as needed for chest pain.    traMADol (ULTRAM) 50 MG tablet Take 1 tablet (50 mg total) by mouth every 12 (twelve) hours as needed for severe pain. Qty: 30 tablet      STOP taking these medications     celecoxib (CELEBREX) 100 MG capsule           Today   CHIEF COMPLAINT:   Patient reports she is doing much better this morning. Mental status seems to be at baseline.   VITAL SIGNS:  Blood pressure 132/66, pulse (!) 55, temperature (!) 97.4 F (36.3 C), temperature source Oral, resp. rate 14, height 5\' 6"  (1.676 m), weight 65.3 kg (144 lb), SpO2 93 %.   REVIEW OF SYSTEMS:  Review of Systems  Constitutional: Negative.  Negative for chills, fever and malaise/fatigue.  HENT: Negative.  Negative for ear discharge, ear pain, hearing loss, nosebleeds and sore throat.   Eyes: Negative.  Negative for blurred vision and pain.  Respiratory: Negative.  Negative for cough, hemoptysis, shortness of breath and wheezing.   Cardiovascular: Negative.  Negative for chest pain, palpitations and leg swelling.  Gastrointestinal: Negative.  Negative for abdominal pain, blood in stool, diarrhea, nausea and vomiting.  Genitourinary: Negative.  Negative for dysuria.  Musculoskeletal: Negative.  Negative for back pain.   Skin: Negative.   Neurological: Negative for dizziness, tremors, speech change, focal weakness, seizures and headaches.  Endo/Heme/Allergies: Negative.  Does not bruise/bleed easily.  Psychiatric/Behavioral: Negative.  Negative for depression, hallucinations and suicidal ideas.     PHYSICAL EXAMINATION:  GENERAL:  83 y.o.-year-old patient lying in the bed with no acute distress.  NECK:  Supple, no jugular venous distention. No thyroid enlargement, no tenderness.  LUNGS: Normal breath sounds bilaterally, no wheezing, rales,rhonchi  No use of accessory muscles of respiration.  CARDIOVASCULAR: S1, S2 normal. No murmurs, rubs, or gallops.  ABDOMEN: Soft, non-tender, non-distended. Bowel sounds present. No organomegaly or mass.  EXTREMITIES: No pedal edema, cyanosis, or clubbing.  PSYCHIATRIC: The patient is alert and oriented x 3.  SKIN: No obvious rash, lesion, or ulcer.   DATA REVIEW:   CBC Recent Labs  Lab 03/02/17 1043  WBC 9.3  HGB 13.5  HCT 40.5  PLT 197    Chemistries  Recent Labs  Lab 03/02/17 1043  03/04/17 0426  NA 137   < > 138  K 6.0*   < > 4.2  CL 110   < > 111  CO2 17*   < > 20*  GLUCOSE 151*   < > 115*  BUN 39*   < > 34*  CREATININE 1.59*   < > 1.45*  CALCIUM 9.8   < > 9.1  AST 30  --   --   ALT 16  --   --   ALKPHOS 86  --   --   BILITOT 1.0  --   --    < > = values in this interval not displayed.    Cardiac Enzymes Recent Labs  Lab 03/02/17 1945 03/02/17 2344 03/03/17 0543  TROPONINI 0.03* 0.05* 0.05*    Microbiology Results  @MICRORSLT48 @  RADIOLOGY:  Ct Head Wo Contrast  Result Date: 03/03/2017 CLINICAL DATA:  83 year old female with altered mental status. EXAM: CT HEAD WITHOUT CONTRAST TECHNIQUE: Contiguous axial images were obtained from the base of the skull through the vertex without intravenous contrast. COMPARISON:  03/02/2017 and prior exams FINDINGS: Brain: No evidence of acute infarction, hemorrhage, hydrocephalus, extra-axial  collection or mass lesion/mass effect. Atrophy and chronic small-vessel white matter ischemic changes again noted. Vascular: Intracranial atherosclerotic vascular calcifications are present. Skull: Normal. Negative for fracture or focal lesion. Sinuses/Orbits: No acute finding. Other: None. IMPRESSION: 1. No evidence of acute intracranial abnormality 2. Atrophy and chronic small-vessel white matter ischemic changes. Electronically Signed   By: Harmon PierJeffrey  Hu M.D.   On: 03/03/2017 12:42   Ct Head Wo Contrast  Result Date: 03/02/2017 CLINICAL DATA:  Altered level consciousness EXAM: CT HEAD WITHOUT CONTRAST TECHNIQUE: Contiguous axial images were obtained from the base of the skull through the vertex without intravenous contrast. COMPARISON:  01/18/2017 FINDINGS: Brain: No evidence of acute infarction, hemorrhage, extra-axial collection, ventriculomegaly, or mass effect. Generalized cerebral atrophy. Periventricular white matter low attenuation likely secondary to microangiopathy. Vascular: Cerebrovascular atherosclerotic calcifications are noted. Skull: Negative for fracture or focal lesion. Sinuses/Orbits: Visualized portions of the orbits are unremarkable. Mastoid sinuses are clear. Prior turbinate resection. Other: None. IMPRESSION: 1. No acute intracranial pathology. 2. Chronic microvascular disease and cerebral atrophy. Electronically Signed   By: Elige KoHetal  Patel   On: 03/02/2017 12:49   [place] Carotid Bilateral (at [facility] And [facility] Only)  Result Date: 03/02/2017 CLINICAL DATA:  Acute encephalopathy. History of hypertension and diabetes. EXAM: BILATERAL CAROTID DUPLEX ULTRASOUND TECHNIQUE: Wallace CullensGray scale imaging, color Doppler and duplex ultrasound were performed of bilateral carotid and vertebral arteries in the neck. COMPARISON:  None. FINDINGS: Criteria: Quantification of carotid stenosis is based on velocity parameters that correlate the residual internal carotid diameter with NASCET-based stenosis levels, using the  diameter of the distal internal carotid lumen as the denominator for stenosis measurement. The following velocity measurements were obtained: RIGHT ICA:  74/15 cm/sec CCA:  75/12 cm/sec SYSTOLIC ICA/CCA RATIO:  1.0 DIASTOLIC ICA/CCA RATIO:  1.2 ECA:  64 cm/sec LEFT ICA:  Unable to be obtained due to patient's combative behavior CCA:  Unable to be obtained due to patient's combative behavior ECA:  Unable to be obtained due to patient's combative behavior RIGHT CAROTID ARTERY: There is a moderate amount of eccentric echogenic plaque involving the origin and proximal aspects of the right internal carotid artery (image 24), not resulting in elevated peak systolic velocities within the interrogated course the right internal carotid artery to suggest a hemodynamically significant stenosis. RIGHT VERTEBRAL ARTERY:  Antegrade flow LEFT CAROTID  ARTERY: Not imaged due to patient's combative behavior. LEFT VERTEBRAL ARTERY: Not imaged due to patient's combative behavior. IMPRESSION: 1. Moderate amount of right-sided atherosclerotic plaque, not definitely resulting in a hemodynamically significant stenosis. 2. The left carotid system was not evaluated due to patient's combative behavior and inability to tolerate the examination. Electronically Signed   By: Simonne ComeJohn  Watts M.D.   On: 03/02/2017 16:46      Current Discharge Medication List    START taking these medications   Details  lactobacillus (FLORANEX/LACTINEX) PACK Take 1 packet (1 g total) by mouth 3 (three) times daily with meals. Qty: 90 packet, Refills: 0      CONTINUE these medications which have NOT CHANGED   Details  acetaminophen (TYLENOL) 500 MG tablet Take 500 mg by mouth every 8 (eight) hours as needed.    aspirin EC 81 MG EC tablet Take 1 tablet (81 mg total) by mouth daily. Qty: 30 tablet, Refills: 0    carvedilol (COREG) 12.5 MG tablet Take 1 tablet (12.5 mg total) by mouth 2 (two) times daily. Qty: 60 tablet, Refills: 11    Cholecalciferol  (VITAMIN D) 2000 units CAPS Take 2,000 Units by mouth daily.     DULoxetine (CYMBALTA) 30 MG capsule Take 30 mg by mouth 2 (two) times daily.    metFORMIN (GLUCOPHAGE-XR) 500 MG 24 hr tablet Take 500 mg by mouth daily with breakfast.    pantoprazole (PROTONIX) 20 MG tablet Take 20 mg by mouth daily.    ramipril (ALTACE) 5 MG capsule Take 1 capsule (5 mg total) by mouth daily. Qty: 7 capsule, Refills: 0    loratadine (CLARITIN) 10 MG tablet Take 10 mg by mouth daily as needed for allergies.    metoCLOPramide (REGLAN) 10 MG tablet Take 1 tablet (10 mg total) by mouth every 6 (six) hours as needed (for nausea/headache). Qty: 12 tablet, Refills: 0    nitroGLYCERIN (NITROSTAT) 0.4 MG SL tablet Place 0.4 mg under the tongue every 5 (five) minutes as needed for chest pain.    traMADol (ULTRAM) 50 MG tablet Take 1 tablet (50 mg total) by mouth every 12 (twelve) hours as needed for severe pain. Qty: 30 tablet      STOP taking these medications     celecoxib (CELEBREX) 100 MG capsule            Management plans discussed with the patient and she is in agreement. Stable for discharge   Patient should follow up with pcp  CODE STATUS:     Code Status Orders  (From admission, onward)        Start     Ordered   03/02/17 1534  Full code  Continuous     03/02/17 1534    Code Status History    Date Active Date Inactive Code Status Order ID Comments User Context   11/25/2016 22:38 11/28/2016 22:01 Full Code [id number]  Beaulah DinningGambino, Christina M, MD Inpatient   09/17/2016 23:47 09/18/2016 22:29 Full Code [id number]  Lovena Neighboursiallo, Abdoulaye, MD ED    Advance Directive Documentation     Most Recent Value  Type of Advance Directive  Healthcare Power of Attorney, Living will  Pre-existing out of facility DNR order (yellow form or pink MOST form)  No data  "MOST" Form in Place?  No data      TOTAL TIME TAKING CARE OF THIS PATIENT: 37 minutes.    Note: This dictation was prepared with Dragon  dictation along with smaller phrase technology. Any transcriptional  errors that result from this process are unintentional.  Marlin Jarrard M.D on 03/04/2017 at 10:12 AM  Between 7am to 6pm - Pager - [phone] After 6pm go to [email or web address] - Social research officer, government  [facility]  Office  [phone]  CC: Primary care physician; Macy Mis, MD

## 2017-03-04 NOTE — Progress Notes (Signed)
  Speech Language Pathology Treatment: Dysphagia  Patient Details Name: Monica Choi MRN: [id number] DOB: 30-Oct-1933 Today's Date: 03/04/2017 Time: 8478-4128 SLP Time Calculation (min) (ACUTE ONLY): 45 min  Assessment / Plan / Recommendation Clinical Impression  Pt was seen for ongoing swallow assessment and diet toleration. Pt is currently on Dysphagia level 3 diet (mechanical soft) w/ thin liquids. Pt was observed during a lunch meal where pt was set upright in chair and independently feeding self. Pt's oral phase appeared grossly WFL c/b adequate mastication, timely A-P transit and swallow, and adequate oral clearing. No overt, immediate s/s of aspiration were noted. No decline in vocal quality or respiratory status were noted. Pt did attempt to talk while eating and was educated on swallowing food first then beginning to talk d/t choking hazards. Pt appeared to understand instructions and did not talk while eating for the remainder of the session. Pt was educated on aspiration precautions - sitting upright during meals, slow pace, small bites/sips, and reducing environmental distractions - talking, television.  Recommend continuing Dysphagia level 3 diet w/ thin liquids; aspiration precautions - sitting upright during meals, slow pace, small bites/sips, and reducing environmental distractions - talking, television. Recommend meds whole in puree.  ST services are no longer indicated at this time d/t pt appearing at her baseline. ST can educate pt/caregiver while admitted. NSG/MD updated.    HPI HPI: Pt is is a 83 y.o. female with a known history of dementia, chronic kidney disease stage III, pacemaker for sick sinus syndrome, presenting from assisted living facility with acute confusion noted earlier this morning, patient was agitated/combative/yelling, also had associated diarrhea to facility, brought to the emergency room noted facial asymmetry/left facial droop on exam, patient requiring sedation  to obtain CT scan of the head which was negative for any acute process, potassium was 6, creatinine 1.5 with baseline 1, urinalysis/urine drug screen negative, EKG with T wave inversion far laterally, patient evaluated in the emergency room status post sedation therapy, currently in restraints, family at the bedside, patient with noted left nasolabial fold flattening, not following commands, patient is poor historian due to encephalopathy, patient now being admitted for acute encephalopathy with facial asymmetry, acute diarrhea, acute kidney injury with chronic kidney disease stage III, and hyperkalemia.      SLP Plan  All goals met       Recommendations  Diet recommendations: Dysphagia 3 (mechanical soft);Thin liquid Liquids provided via: Cup;Straw Medication Administration: Whole meds with puree Supervision: Patient able to self feed;Intermittent supervision to cue for compensatory strategies Compensations: Minimize environmental distractions;Slow rate;Small sips/bites;Lingual sweep for clearance of pocketing;Follow solids with liquid Postural Changes and/or Swallow Maneuvers: Seated upright 90 degrees;Upright 30-60 min after meal                Oral Care Recommendations: Oral care BID;Patient independent with oral care Follow up Recommendations: Skilled Nursing facility SLP Visit Diagnosis: Dysphagia, unspecified (R13.10) Plan: All goals met       GO               Carolynn Sayers, SLP-Graduate Student Carolynn Sayers 03/04/2017, 12:43 PM    The information in this patient note, response to treatment, and overall treatment plan developed has been reviewed and agreed upon by this clinician.  Orinda Kenner, [place], [place] [phone] 03/04/17,2:36 PM

## 2017-03-05 ENCOUNTER — Encounter: Payer: Self-pay | Admitting: *Deleted

## 2017-03-05 ENCOUNTER — Other Ambulatory Visit: Payer: Self-pay | Admitting: *Deleted

## 2017-03-05 NOTE — Patient Outreach (Addendum)
[facility] CM Telephone Outreach, Transition of Care day 1  03/05/2017  Monica CageBarbara Choi 10/28/1933 [id number]  Successful telephone outreach x 2 to Monica Choi, 83 y/o female [facility] for transition of care after recent hospitalization November 26-28, 2018 for encephalopathy/ dehydration.  Patient was discharged to ALF, where she resides, without home health services in place.  Patient has history including, but not limited to, CKD stage III, DM, HTN/ HLD.  HIPAA/ identity verified with patient today during phone call.  Today, patient reports that she "is doing okay" after recent [facility] visit; however, patient's voice sounded somewhat slurred, and she was unable to definitively state the year of her birth, or the name of her assisted living facility.  I was unable to understand much of what patient was saying during phone call to her.  [facility] had signed [facility] written [facility] on 03/04/17 prior to discharge home from [facility], and included granddaughter Monica CarinaLindsey Choi on written consent.  Due to my inability to understand what patient was saying during phone call to her, I therefore placed second call to granddaughter, HIPPA identifiers verified with granddaughter during second phone call.  Granddaughter Monica LaymanLindsey reported today that patient "is doing so much better" after her [facility] visit.  Stated that she took patient back to ALF yesterday, and that patient is "feeling much better."  Explained to CallawayLindsey that I was concerned after speaking with patient, as patient's voice sounded somewhat slurred; Monica LaymanLindsey reports that patient "probably just woke up from her nap," as she "takes a nap every afternoon."  Monica LaymanLindsey states that "other tan the staff at the ALF," she is primary caregiver for patient.  [facility] Community CM services were discussed with Monica LaymanLindsey today, and she provided verbal consent for [facility] CM involvement in patient's  care, however, stated that she is unsure if patient "needs" [facility] CM services, as "the ALF staff take great care" of patient.  Encouraged Monica LaymanLindsey to discuss need for [facility] CM services with patient and consider need for [facility] CM services.  Explained that I was contacting patient for primary [facility] RN CM Monica [facility].  Granddaughter/ caregiver further reports:  -- has discharge instructions at her home; reports she works and does not have discharge instructions with her during phone call; denies questions about patient's discharge instructions -- reports "not sure" what medications patient takes regularly, as patient's medications are all handled by ALF staff, who administer and manage all aspects of patient's medications; reports she provided copy of discharge instructions wit medication changes to ALF staff; denies that patient has difficulty swallowing medications -- reports no [facility] follow up PCP appointment scheduled yet; discussed need to schedule office visit with PCP asap, and Monica LaymanLindsey agrees to do -- reports that she Monica Choi(Monica) provides transportation for patient to all provider appointments -- reports ALF staff manage patient medications, provide meals for patient, and assists with all hygiene needs/ care; voices confidence in ALF staff, stating "she gets great supervision; there is always someone around looking in on her," and she denies community resource needs -- reports patient has experienced several falls over last few years; states patient now using walker "regularly;" general fall risks/ prevention education discussed with granddaughter today -- denies that home health services were ordered post-Choi discharge, stating, 'the ALF do everything home health would do." --reports patient currently has exisisting AD in place for living will/ HCPOA, denies patient would want to make changes in either.    Patient's granddaughter  denies further issues, concerns, or problems around patient's  current status today.  I provided Monica LaymanLindsey with my direct phone number, and confirmed that she has the main [facility] CM office phone number, and the [facility] CM 24-hour nurse advice phone number should issues arise prior to next scheduled [facility] Community CM outreach.  Again explained that Monica Choi(Monica) would contact patient/ granddaughter again next week for ongoing transition of care and follow up on need for [facility] CM services; granddaughter Monica LaymanLindsey verbalized understanding and agreement with this plan.  Plan:  Will make patient's PCP aware of [facility] Community CM involvement in patient's care post-[facility] discharge  Will update patient's primary [facility] CM of today's successful telephone outreach to patient and her granddaughter  [facility] CM Care Plan Problem One     Most Recent Value  Care Plan Problem One  Risk for Choi readmission related to recent hospitalization for metabolic encephalopathy/ dehydration  Role Documenting the Problem One  Care Management Coordinator  Care Plan for Problem One  Active  [facility] Long Term Goal   Over the next 31 days, patient will not experience [facility]oi readmission as evidenced by patient/ family reporting and review of EMR during [facility] RN CCM outreach  [facility] Long Term Goal Start Date  03/05/17  Interventions for Problem One Long Term Goal  Discussed with patient and her granddaughter current clinical status of patient,  current services at ALF, [facility] Community CM program,  TOC program initiated  [facility] CM Short Term Goal #1   Over the next 7 days, patient/ family will schedule [facility] follow up visit with PCP, as evidenced by patient/ family reporting during [facility] RN CCm outreach  [facility] CM Short Term Goal #1 Start Date  03/05/17  Interventions for Short Term Goal #1  Discussed with patient and her granddaughter importance of prompt Choi follow up visit with PCP, and encouraged granddaughter to schedule PCP office visit promptly  [facility] CM Short Term Goal #2   Over the next 7 days, patient/  granddaughter will consider need for [facility] CCM involvement in patient's care post Choi discharge, as evidenced by patient/ granddaughter reporting during [facility] RN CCM outreach  [facility] CM Short Term Goal #2 Start Date  03/05/17  Interventions for Short Term Goal #2  Discussed [facility] CM services with patient's granddaughter and patient's current support at ALF     Caryl PinaLaine Mckinney Cliffton Spradley, RN, BSN, [employer] [facility] [employer] Community Care Coordinator [facility] Care Management  [phone]

## 2017-03-06 ENCOUNTER — Ambulatory Visit: Payer: Self-pay | Admitting: *Deleted

## 2017-03-06 ENCOUNTER — Encounter: Payer: Self-pay | Admitting: *Deleted

## 2017-03-13 ENCOUNTER — Other Ambulatory Visit: Payer: Self-pay | Admitting: *Deleted

## 2017-03-13 ENCOUNTER — Encounter: Payer: Self-pay | Admitting: *Deleted

## 2017-03-13 NOTE — Patient Outreach (Signed)
Successful telephone encounter to granddaughter Monica [facility] ([facility])for Monica CageBarbara Tieken, 83 year old female, pt [facility] CM for transition of care after recent hospitalization November 26-28,2018 for encephalopathy/dehydration, discharged to ALF [facility] where she lives.  Spoke with granddaughter, HIPAA identifiers verified on pt, discussed purpose of call- follow up on call made with coworker Su HiltLaine RN CM last week - see if PCP office visit was  Scheduled for pt  and if spoke with pt about [facility] Community CM services.  Granddaughter reports pt is to see PCP 03/16/17, did not think pt needs [facility] services at this time- ALF handles everything for pt (meds, home health needs, supervision).  Granddaughter confirmed has RN CM's number if needs arise in the future.      Plan:  As discussed with granddaughter, to close case.             Plan to inform Dr. Earnest BaileyBriscoe of discharge, send case closure letter.            Plan to inform [facility] C[facility] to close case.     Shayne Alkenose M.   Pierzchala RN CCM [facility] Care Management  [phone]

## 2017-04-16 ENCOUNTER — Emergency Department: Payer: PPO

## 2017-04-16 ENCOUNTER — Other Ambulatory Visit: Payer: Self-pay

## 2017-04-16 ENCOUNTER — Ambulatory Visit (INDEPENDENT_AMBULATORY_CARE_PROVIDER_SITE_OTHER): Payer: PPO | Admitting: *Deleted

## 2017-04-16 ENCOUNTER — Encounter: Payer: Self-pay | Admitting: Emergency Medicine

## 2017-04-16 ENCOUNTER — Emergency Department
Admission: EM | Admit: 2017-04-16 | Discharge: 2017-04-16 | Disposition: A | Discharge status: Home / Self Care | Payer: PPO | Attending: Emergency Medicine | Admitting: Emergency Medicine

## 2017-04-16 DIAGNOSIS — R109 Unspecified abdominal pain: Secondary | ICD-10-CM

## 2017-04-16 DIAGNOSIS — I10 Essential (primary) hypertension: Secondary | ICD-10-CM | POA: Diagnosis not present

## 2017-04-16 DIAGNOSIS — I495 Sick sinus syndrome: Secondary | ICD-10-CM | POA: Diagnosis not present

## 2017-04-16 DIAGNOSIS — Z7982 Long term (current) use of aspirin: Secondary | ICD-10-CM | POA: Diagnosis not present

## 2017-04-16 DIAGNOSIS — Z9049 Acquired absence of other specified parts of digestive tract: Secondary | ICD-10-CM | POA: Diagnosis not present

## 2017-04-16 DIAGNOSIS — F419 Anxiety disorder, unspecified: Secondary | ICD-10-CM | POA: Diagnosis not present

## 2017-04-16 DIAGNOSIS — Z8673 Personal history of transient ischemic attack (TIA), and cerebral infarction without residual deficits: Secondary | ICD-10-CM | POA: Diagnosis not present

## 2017-04-16 DIAGNOSIS — R1032 Left lower quadrant pain: Secondary | ICD-10-CM | POA: Diagnosis not present

## 2017-04-16 DIAGNOSIS — Z7984 Long term (current) use of oral hypoglycemic drugs: Secondary | ICD-10-CM | POA: Insufficient documentation

## 2017-04-16 DIAGNOSIS — N644 Mastodynia: Secondary | ICD-10-CM | POA: Insufficient documentation

## 2017-04-16 DIAGNOSIS — R1030 Lower abdominal pain, unspecified: Secondary | ICD-10-CM | POA: Diagnosis present

## 2017-04-16 DIAGNOSIS — E119 Type 2 diabetes mellitus without complications: Secondary | ICD-10-CM | POA: Insufficient documentation

## 2017-04-16 DIAGNOSIS — Z87891 Personal history of nicotine dependence: Secondary | ICD-10-CM | POA: Diagnosis not present

## 2017-04-16 DIAGNOSIS — Z79899 Other long term (current) drug therapy: Secondary | ICD-10-CM | POA: Insufficient documentation

## 2017-04-16 DIAGNOSIS — R197 Diarrhea, unspecified: Secondary | ICD-10-CM | POA: Diagnosis not present

## 2017-04-16 LAB — COMPREHENSIVE METABOLIC PANEL
ALBUMIN: 4.1 g/dL (ref 3.5–5.0)
ALT: 19 U/L (ref 14–54)
ANION GAP: 9 (ref 5–15)
AST: 24 U/L (ref 15–41)
Alkaline Phosphatase: 86 U/L (ref 38–126)
BUN: 31 mg/dL — ABNORMAL HIGH (ref 6–20)
CHLORIDE: 107 mmol/L (ref 101–111)
CO2: 24 mmol/L (ref 22–32)
Calcium: 9.9 mg/dL (ref 8.9–10.3)
Creatinine, Ser: 1.21 mg/dL — ABNORMAL HIGH (ref 0.44–1.00)
GFR calc Af Amer: 47 mL/min — ABNORMAL LOW (ref >60)
GFR calc non Af Amer: 41 mL/min — ABNORMAL LOW (ref >60)
GLUCOSE: 111 mg/dL — AB (ref 65–99)
Potassium: 4.2 mmol/L (ref 3.5–5.1)
SODIUM: 140 mmol/L (ref 135–145)
Total Bilirubin: 0.7 mg/dL (ref 0.3–1.2)
Total Protein: 7.1 g/dL (ref 6.5–8.1)

## 2017-04-16 LAB — CBC
HCT: 39.3 % (ref 35.0–47.0)
HEMOGLOBIN: 13 g/dL (ref 12.0–16.0)
MCH: 30.8 pg (ref 26.0–34.0)
MCHC: 33.2 g/dL (ref 32.0–36.0)
MCV: 92.8 fL (ref 80.0–100.0)
Platelets: 232 10*3/uL (ref 150–440)
RBC: 4.23 MIL/uL (ref 3.80–5.20)
RDW: 13.5 % (ref 11.5–14.5)
WBC: 8.4 10*3/uL (ref 3.6–11.0)

## 2017-04-16 LAB — URINALYSIS, COMPLETE (UACMP) WITH MICROSCOPIC
Bilirubin Urine: NEGATIVE
Glucose, UA: NEGATIVE mg/dL
Hgb urine dipstick: NEGATIVE
Ketones, ur: NEGATIVE mg/dL
Leukocytes, UA: NEGATIVE
Nitrite: NEGATIVE
PH: 6 (ref 5.0–8.0)
Protein, ur: 100 mg/dL — AB
SPECIFIC GRAVITY, URINE: 1.015 (ref 1.005–1.030)

## 2017-04-16 LAB — LIPASE, BLOOD: Lipase: 30 U/L (ref 11–51)

## 2017-04-16 MED ORDER — IOPAMIDOL (ISOVUE-300) INJECTION 61%
75.0000 mL | Freq: Once | INTRAVENOUS | Status: AC | PRN
  Administered 2017-04-16: 75 mL via INTRAVENOUS

## 2017-04-16 MED ORDER — IOPAMIDOL (ISOVUE-300) INJECTION 61%
30.0000 mL | Freq: Once | INTRAVENOUS | Status: AC | PRN
Start: 2017-04-16 — End: 2017-04-16
  Administered 2017-04-16: 30 mL via ORAL

## 2017-04-16 NOTE — ED Provider Notes (Signed)
[facility] Emergency Department Provider Note   ____________________________________________   I have reviewed the triage vital signs and the nursing notes.   HISTORY  Chief Complaint Abdominal pain  History limited by and level 5 caveat due to: Dementia, some history obtained from family.   HPI Monica Choi is a 84 y.o. female who presents to the emergency department today with primary complaint of abdominal pain.   LOCATION:lower and left side abdominal pain DURATION:roughly 1 week TIMING: intermittent SEVERITY: severe QUALITY: pain CONTEXT: patient states that the pain has been intermittent for months, however family states it has only been going on for one week. Family states patient has had history of diverticulitis in the past. MODIFYING FACTORS: none ASSOCIATED SYMPTOMS: has had nausea. No fevers. ADDITIONAL COMPLAINT: left breast pain with knots. Unclear how long this has been going on. This is also intermittent.  Per medical record review patient has a history of diverticulitis, multiple abdominal surgeries.   Past Medical History:  Diagnosis Date  . Anemia   . Anxiety   . Arrhythmia   . Diabetes mellitus without complication (HCC)   . Diverticulitis   . GERD (gastroesophageal reflux disease)   . Hypertension   . Presence of permanent cardiac pacemaker    hx/notes 11/25/2016  . Rectal prolapse   . Renal disorder   . Rhabdomyolysis 11/2016  . Sick sinus syndrome (HCC)    hx/notes 11/25/2016  . Sinus node dysfunction (HCC)   . TIA (transient ischemic attack) 07/2016   hx/notes 11/25/2016    Patient Active Problem List   Diagnosis Date Noted  . Encephalopathy 03/02/2017  . Rhabdomyolysis 11/25/2016  . Nonintractable headache   . TIA (transient ischemic attack) 09/17/2016  . Hypertension 07/17/2016  . Sick sinus syndrome (HCC) 07/17/2016    Past Surgical History:  Procedure Laterality Date  . ABDOMINAL HYSTERECTOMY    .  ABDOMINAL SURGERY    . CARDIAC SURGERY    . CATARACT EXTRACTION Bilateral   . CHOLECYSTECTOMY    . COLON SURGERY    . HERNIA REPAIR    . INSERT / REPLACE / REMOVE PACEMAKER     hx/notes 11/25/2016  . KNEE SURGERY    . NASAL SINUS SURGERY      Prior to Admission medications   Medication Sig Start Date End Date Taking? Authorizing Provider  acetaminophen (TYLENOL) 500 MG tablet Take 500 mg by mouth every 8 (eight) hours as needed.    [provider]  aspirin EC 81 MG EC tablet Take 1 tablet (81 mg total) by mouth daily. 09/19/16   Tillman Sers, DO  carvedilol (COREG) 12.5 MG tablet Take 1 tablet (12.5 mg total) by mouth 2 (two) times daily. 07/17/16   Camnitz, Andree Coss, MD  Cholecalciferol (VITAMIN D) 2000 units CAPS Take 2,000 Units by mouth daily.     [provider]  DULoxetine (CYMBALTA) 30 MG capsule Take 30 mg by mouth 2 (two) times daily.    [provider]  loratadine (CLARITIN) 10 MG tablet Take 10 mg by mouth daily as needed for allergies.    [provider]  metFORMIN (GLUCOPHAGE-XR) 500 MG 24 hr tablet Take 500 mg by mouth daily with breakfast.    [provider]  metoCLOPramide (REGLAN) 10 MG tablet Take 1 tablet (10 mg total) by mouth every 6 (six) hours as needed (for nausea/headache). 09/16/16   Molpus, John, MD  nitroGLYCERIN (NITROSTAT) 0.4 MG SL tablet Place 0.4 mg under the tongue  every 5 (five) minutes as needed for chest pain.    [provider]  pantoprazole (PROTONIX) 20 MG tablet Take 20 mg by mouth daily. 04/08/16   [provider]  ramipril (ALTACE) 5 MG capsule Take 1 capsule (5 mg total) by mouth daily. Patient taking differently: Take 10 mg by mouth daily.  01/19/17   Sharyn Creamer, MD  traMADol (ULTRAM) 50 MG tablet Take 1 tablet (50 mg total) by mouth every 12 (twelve) hours as needed for severe pain. Patient not taking: Reported on 01/18/2017 11/28/16   Marthenia Rolling, DO    Allergies Morphine;  Sulfa antibiotics; Amoxicillin; Benzonatate; Buprenorphine; Fish-derived products; Penicillins; Ciprofloxacin; and Morphine and related  Family History  Problem Relation Age of Onset  . Heart attack Father   . Heart attack Brother     Social History Social History   Tobacco Use  . Smoking status: Former Games developer  . Smokeless tobacco: Never Used  Substance Use Topics  . Alcohol use: No  . Drug use: No    Review of Systems Constitutional: No fever/chills Eyes: No visual changes. ENT: No sore throat. Cardiovascular: Denies chest pain. Respiratory: Denies shortness of breath. Gastrointestinal: Positive for abdominal pain.  Genitourinary: Negative for dysuria. Musculoskeletal: Negative for back pain. Skin: Negative for rash. Neurological: Negative for headaches, focal weakness or numbness.  ____________________________________________   PHYSICAL EXAM:  VITAL SIGNS: ED Triage Vitals [04/16/17 1522]  Enc Vitals Group     BP (!) 194/97     Pulse Rate 60     Resp 12     Temp 97.9 F (36.6 C)     Temp Source Oral     SpO2 94 %     Weight 144 lb (65.3 kg)     Height 5\' 4"  (1.626 m)    Constitutional: Awake and alert. Not completely oriented. In no acute distress.  Eyes: Conjunctivae are normal.  ENT   Head: Normocephalic and atraumatic.   Nose: No congestion/rhinnorhea.   Mouth/Throat: Mucous membranes are moist.   Neck: No stridor. Hematological/Lymphatic/Immunilogical: No cervical lymphadenopathy. Cardiovascular: Normal rate, regular rhythm.  No murmurs, rubs, or gallops.  Respiratory: Normal respiratory effort without tachypnea nor retractions. Breath sounds are clear and equal bilaterally. No wheezes/rales/rhonchi. Gastrointestinal: Soft and tender to palpation in the suprapubic and left lower quadrant. No rebound. No guarding.  Genitourinary: Deferred Musculoskeletal: Normal range of motion in all extremities. No lower extremity edema. Neurologic:   Normal speech and language. No gross focal neurologic deficits are appreciated.  Skin:  Skin is warm, dry and intact. No rash noted. Psychiatric: Mood and affect are normal. Speech and behavior are normal. Patient exhibits appropriate insight and judgment.  ____________________________________________    LABS (pertinent positives/negatives)  Lipase 30 CBC wnl CMP glu 111, cr 1.21  ____________________________________________   EKG  None  ____________________________________________    RADIOLOGY  CT abd/pel No acute disease  ____________________________________________   PROCEDURES  Procedures  ____________________________________________   INITIAL IMPRESSION / ASSESSMENT AND PLAN / ED COURSE  Pertinent labs & imaging results that were available during my care of the patient were reviewed by me and considered in my medical decision making (see chart for details).  Patient presented to the emergency department today with primary complaint of abdominal pain. ddx would include gastroenteritis, SBO, diverticulitis, colitis, pancreatitis, UTI amongst other etiologies. Work up here without concerning findings. Patient also had secondary complaint of left breast pain. Breast exam was performed with nurse tech Allayne Stack present. No erythema, fluctuance  or mass felt. At this time do feel patient does not require any further emergent work up. Will discharge to follow up with primary care.   ____________________________________________   FINAL CLINICAL IMPRESSION(S) / ED DIAGNOSES  Final diagnoses:  Abdominal pain, unspecified abdominal location  Breast pain     Note: This dictation was prepared with Dragon dictation. Any transcriptional errors that result from this process are unintentional     Phineas SemenGoodman, Jerrie Gullo, MD 04/16/17 2231

## 2017-04-16 NOTE — ED Notes (Signed)
Pt unhooked and assisted to standing position so she can go to the bathroom. Pt ambulates alone to the bathroom.

## 2017-04-16 NOTE — Discharge Instructions (Signed)
Please seek medical attention for any high fevers, chest pain, shortness of breath, change in behavior, persistent vomiting, bloody stool or any other new or concerning symptoms.  

## 2017-04-16 NOTE — Progress Notes (Signed)
Remote pacemaker transmission.   

## 2017-04-16 NOTE — ED Triage Notes (Signed)
Pt to ED via POV. Pt states that she has been having left lower abdominal pain and also that she has 3 knots in her left breast. Pt states that the knots are painful. Pt states that she has been having a lot of diarrhea but denies N/V. Pt in NAD at this time.

## 2017-04-24 ENCOUNTER — Encounter: Payer: Self-pay | Admitting: Cardiology

## 2017-04-30 ENCOUNTER — Other Ambulatory Visit: Payer: Self-pay

## 2017-04-30 ENCOUNTER — Emergency Department
Admission: EM | Admit: 2017-04-30 | Discharge: 2017-04-30 | Disposition: A | Discharge status: Home / Self Care | Payer: PPO | Attending: Emergency Medicine | Admitting: Emergency Medicine

## 2017-04-30 DIAGNOSIS — Z79899 Other long term (current) drug therapy: Secondary | ICD-10-CM | POA: Insufficient documentation

## 2017-04-30 DIAGNOSIS — F0391 Unspecified dementia with behavioral disturbance: Secondary | ICD-10-CM

## 2017-04-30 DIAGNOSIS — Z7984 Long term (current) use of oral hypoglycemic drugs: Secondary | ICD-10-CM | POA: Diagnosis not present

## 2017-04-30 DIAGNOSIS — Z87891 Personal history of nicotine dependence: Secondary | ICD-10-CM | POA: Diagnosis not present

## 2017-04-30 DIAGNOSIS — Z7982 Long term (current) use of aspirin: Secondary | ICD-10-CM | POA: Insufficient documentation

## 2017-04-30 DIAGNOSIS — I10 Essential (primary) hypertension: Secondary | ICD-10-CM | POA: Diagnosis not present

## 2017-04-30 DIAGNOSIS — R4182 Altered mental status, unspecified: Secondary | ICD-10-CM | POA: Diagnosis not present

## 2017-04-30 DIAGNOSIS — R456 Violent behavior: Secondary | ICD-10-CM | POA: Diagnosis not present

## 2017-04-30 DIAGNOSIS — R451 Restlessness and agitation: Secondary | ICD-10-CM | POA: Diagnosis not present

## 2017-04-30 DIAGNOSIS — Z8673 Personal history of transient ischemic attack (TIA), and cerebral infarction without residual deficits: Secondary | ICD-10-CM | POA: Diagnosis not present

## 2017-04-30 DIAGNOSIS — E119 Type 2 diabetes mellitus without complications: Secondary | ICD-10-CM | POA: Insufficient documentation

## 2017-04-30 DIAGNOSIS — R9431 Abnormal electrocardiogram [ECG] [EKG]: Secondary | ICD-10-CM | POA: Diagnosis not present

## 2017-04-30 LAB — CBC
HCT: 38 % (ref 35.0–47.0)
Hemoglobin: 12.6 g/dL (ref 12.0–16.0)
MCH: 30.8 pg (ref 26.0–34.0)
MCHC: 33 g/dL (ref 32.0–36.0)
MCV: 93.1 fL (ref 80.0–100.0)
PLATELETS: 191 10*3/uL (ref 150–440)
RBC: 4.08 MIL/uL (ref 3.80–5.20)
RDW: 12.9 % (ref 11.5–14.5)
WBC: 6.8 10*3/uL (ref 3.6–11.0)

## 2017-04-30 LAB — URINALYSIS, COMPLETE (UACMP) WITH MICROSCOPIC
Bilirubin Urine: NEGATIVE
GLUCOSE, UA: NEGATIVE mg/dL
HGB URINE DIPSTICK: NEGATIVE
Ketones, ur: NEGATIVE mg/dL
LEUKOCYTES UA: NEGATIVE
NITRITE: NEGATIVE
PH: 5 (ref 5.0–8.0)
Protein, ur: 100 mg/dL — AB
SPECIFIC GRAVITY, URINE: 1.023 (ref 1.005–1.030)

## 2017-04-30 LAB — COMPREHENSIVE METABOLIC PANEL
ALT: 17 U/L (ref 14–54)
AST: 30 U/L (ref 15–41)
Albumin: 4.1 g/dL (ref 3.5–5.0)
Alkaline Phosphatase: 60 U/L (ref 38–126)
Anion gap: 10 (ref 5–15)
BILIRUBIN TOTAL: 0.9 mg/dL (ref 0.3–1.2)
BUN: 28 mg/dL — AB (ref 6–20)
CHLORIDE: 107 mmol/L (ref 101–111)
CO2: 22 mmol/L (ref 22–32)
CREATININE: 1.81 mg/dL — AB (ref 0.44–1.00)
Calcium: 9.4 mg/dL (ref 8.9–10.3)
GFR calc Af Amer: 29 mL/min — ABNORMAL LOW (ref >60)
GFR, EST NON AFRICAN AMERICAN: 25 mL/min — AB (ref >60)
Glucose, Bld: 140 mg/dL — ABNORMAL HIGH (ref 65–99)
POTASSIUM: 4.5 mmol/L (ref 3.5–5.1)
Sodium: 139 mmol/L (ref 135–145)
TOTAL PROTEIN: 6.7 g/dL (ref 6.5–8.1)

## 2017-04-30 LAB — TROPONIN I: Troponin I: <0.03 ng/mL (ref <0.03)

## 2017-04-30 LAB — LIPASE, BLOOD: LIPASE: 30 U/L (ref 11–51)

## 2017-04-30 NOTE — ED Notes (Signed)
Pt discharged and is being taken back to [facility] with pt's son in law. Son in law called [facility] to let them know they were on their way back. Pt alert, in NAD at this time. DC paperwork with son in law.

## 2017-04-30 NOTE — ED Notes (Signed)
Pts family member at bedside

## 2017-04-30 NOTE — ED Triage Notes (Signed)
[facility] "flipped out and destroyed room, threw food and broke the toilet." On pt's chart from facility it states "unspecified dementia." Pt able to answer questions, pt calm and cooperative at this time.

## 2017-04-30 NOTE — ED Provider Notes (Signed)
[facility] Emergency Department Provider Note  Time seen: 9:02 PM  I have reviewed the triage vital signs and the nursing notes.   HISTORY  Chief Complaint Altered Mental Status    HPI Gurbani Figge is a 84 y.o. female with a past medical history of dementia, diabetes, anxiety, presents to the emergency department for aggressive behavior/agitation.  According to EMS report [facility], tonight states the patient became acutely agitated and was throwing things around her room and destroying her room.  Patient does have dementia but is very mild per report.  Here the patient is alert to place, states the years 2020.  When asked about the incident earlier she initially said she did not recall, but then said she believes she was trying to put something on the shelf and everything fell off the shelf by accident.  Patient is a very poor historian.  Unable to obtain review of systems, patient states she does not remember to most of the questions asked.  Overall the patient appears well, no distress, lying in bed calm, comfortable and cooperative.  Past Medical History:  Diagnosis Date  . Anemia   . Anxiety   . Arrhythmia   . Diabetes mellitus without complication (HCC)   . Diverticulitis   . GERD (gastroesophageal reflux disease)   . Hypertension   . Presence of permanent cardiac pacemaker    hx/notes 11/25/2016  . Rectal prolapse   . Renal disorder   . Rhabdomyolysis 11/2016  . Sick sinus syndrome (HCC)    hx/notes 11/25/2016  . Sinus node dysfunction (HCC)   . TIA (transient ischemic attack) 07/2016   hx/notes 11/25/2016    Patient Active Problem List   Diagnosis Date Noted  . Encephalopathy 03/02/2017  . Rhabdomyolysis 11/25/2016  . Nonintractable headache   . TIA (transient ischemic attack) 09/17/2016  . Hypertension 07/17/2016  . Sick sinus syndrome (HCC) 07/17/2016    Past Surgical History:  Procedure Laterality Date  . ABDOMINAL  HYSTERECTOMY    . ABDOMINAL SURGERY    . CARDIAC SURGERY    . CATARACT EXTRACTION Bilateral   . CHOLECYSTECTOMY    . COLON SURGERY    . HERNIA REPAIR    . INSERT / REPLACE / REMOVE PACEMAKER     hx/notes 11/25/2016  . KNEE SURGERY    . NASAL SINUS SURGERY      Prior to Admission medications   Medication Sig Start Date End Date Taking? Authorizing Provider  acetaminophen (TYLENOL) 500 MG tablet Take 500 mg by mouth every 8 (eight) hours as needed.    [provider]  aspirin EC 81 MG EC tablet Take 1 tablet (81 mg total) by mouth daily. 09/19/16   Tillman Sers, DO  carvedilol (COREG) 12.5 MG tablet Take 1 tablet (12.5 mg total) by mouth 2 (two) times daily. 07/17/16   Camnitz, Andree Coss, MD  Cholecalciferol (VITAMIN D) 2000 units CAPS Take 2,000 Units by mouth daily.     [provider]  DULoxetine (CYMBALTA) 30 MG capsule Take 30 mg by mouth 2 (two) times daily.    [provider]  loratadine (CLARITIN) 10 MG tablet Take 10 mg by mouth daily as needed for allergies.    [provider]  metFORMIN (GLUCOPHAGE-XR) 500 MG 24 hr tablet Take 500 mg by mouth daily with breakfast.    [provider]  metoCLOPramide (REGLAN) 10 MG tablet Take 1 tablet (10 mg total) by mouth every 6 (six)  hours as needed (for nausea/headache). 09/16/16   Molpus, John, MD  nitroGLYCERIN (NITROSTAT) 0.4 MG SL tablet Place 0.4 mg under the tongue every 5 (five) minutes as needed for chest pain.    [provider]  pantoprazole (PROTONIX) 20 MG tablet Take 20 mg by mouth daily. 04/08/16   [provider]  ramipril (ALTACE) 5 MG capsule Take 1 capsule (5 mg total) by mouth daily. Patient taking differently: Take 10 mg by mouth daily.  01/19/17   Sharyn CreamerQuale, Mark, MD  traMADol (ULTRAM) 50 MG tablet Take 1 tablet (50 mg total) by mouth every 12 (twelve) hours as needed for severe pain. Patient not taking: Reported on 01/18/2017 11/28/16   Marthenia RollingBland, Scott, DO     Allergies  Allergen Reactions  . Morphine Nausea And Vomiting  . Sulfa Antibiotics Anaphylaxis and Rash  . Amoxicillin Other (See Comments)  . Benzonatate Other (See Comments)  . Buprenorphine Nausea And Vomiting  . Fish-Derived Products Nausea Only  . Penicillins Hives    Has patient had a PCN reaction causing immediate rash, facial/tongue/throat swelling, SOB or lightheadedness with hypotension: Yes Has patient had a PCN reaction causing severe rash involving mucus membranes or skin necrosis: Unknown Has patient had a PCN reaction that required hospitalization: Unknown Has patient had a PCN reaction occurring within the last 10 years: No If all of the above answers are "NO", then may proceed with Cephalosporin use.   . Ciprofloxacin Itching and Rash  . Morphine And Related Nausea And Vomiting    Family History  Problem Relation Age of Onset  . Heart attack Father   . Heart attack Brother     Social History Social History   Tobacco Use  . Smoking status: Former Games developermoker  . Smokeless tobacco: Never Used  Substance Use Topics  . Alcohol use: No  . Drug use: No    Review of Systems Unable to obtain an adequate/accurate review of systems due to dementia  ____________________________________________   PHYSICAL EXAM:  VITAL SIGNS: ED Triage Vitals [04/30/17 2034]  Enc Vitals Group     BP (!) 169/63     Pulse Rate 61     Resp 13     Temp 97.9 F (36.6 C)     Temp Source Oral     SpO2 97 %     Weight 144 lb (65.3 kg)     Height 5' 4.5" (1.638 m)     Head Circumference      Peak Flow      Pain Score 5     Pain Loc      Pain Edu?      Excl. in GC?    Constitutional: Alert, oriented to person and place, states the years 2020. Well appearing and in no distress. Eyes: Normal exam ENT   Head: Normocephalic and atraumatic.   Mouth/Throat: Mucous membranes are moist. Cardiovascular: Normal rate, regular rhythm. Respiratory: Normal respiratory effort  without tachypnea nor retractions. Breath sounds are clear  Gastrointestinal: Soft and nontender. No distention. Musculoskeletal: Nontender with normal range of motion in all extremities.  No lower extremity edema. Neurologic:  Normal speech and language. No gross focal neurologic deficits  Skin:  Skin is warm, dry and intact.  Psychiatric: Mood and affect are normal.   ____________________________________________    EKG  EKG reviewed and interpreted by myself shows a sinus rhythm at 62 bpm with a narrow QRS, normal axis, largely normal intervals, patient does have inferolateral T wave inversions however  this is unchanged compared to prior EKG 12/22/16.  ____________________________________________   INITIAL IMPRESSION / ASSESSMENT AND PLAN / ED COURSE  Pertinent labs & imaging results that were available during my care of the patient were reviewed by me and considered in my medical decision making (see chart for details).  Patient presents to the emergency department for agitation/aggression at her nursing facility.  Currently is calm and cooperative.  Differential would include dementia with behavioral disturbance, infectious etiology, metabolic or electrolyte abnormality.  We will check labs, urinalysis, closely monitor in the emergency department.  Overall the patient appears well with no complaints at this time.  Medical workup has been largely nonrevealing.  Mild renal insufficiency but no significant change from baseline.  Patient will be discharged back to her facility.  Son-in-law is here with the patient states this occurs every 6-8 weeks or so the patient becomes agitated or has escalation in her behavior.  I highly suspect this is due to worsening dementia. ____________________________________________   FINAL CLINICAL IMPRESSION(S) / ED DIAGNOSES  Agitation    Minna Antis, MD 04/30/17 2254

## 2017-04-30 NOTE — ED Notes (Signed)
2 unsuccessful PIV attempts by this RN (RIGHT wrist and RIGHT forearm).

## 2017-05-20 LAB — CUP PACEART REMOTE DEVICE CHECK
Battery Remaining Longevity: 132 mo
Battery Remaining Percentage: 95.5 %
Battery Voltage: 3.01 V
Brady Statistic AP VP Percent: 1 %
Brady Statistic AP VS Percent: 22 %
Brady Statistic AS VS Percent: 78 %
Date Time Interrogation Session: 20190110070015
Implantable Lead Implant Date: 20160108
Implantable Lead Location: 753862
Implantable Lead Model: 1948
Implantable Pulse Generator Implant Date: 20160108
Lead Channel Impedance Value: 380 Ohm
Lead Channel Impedance Value: 510 Ohm
Lead Channel Pacing Threshold Amplitude: 0.5 V
Lead Channel Pacing Threshold Amplitude: 1.25 V
Lead Channel Pacing Threshold Pulse Width: 0.4 ms
Lead Channel Sensing Intrinsic Amplitude: 5 mV
Lead Channel Setting Pacing Amplitude: 1.5 V
Lead Channel Setting Sensing Sensitivity: 2 mV
MDC IDC LEAD IMPLANT DT: 20160108
MDC IDC LEAD LOCATION: 753862
MDC IDC MSMT LEADCHNL RA PACING THRESHOLD PULSEWIDTH: 0.4 ms
MDC IDC MSMT LEADCHNL RV SENSING INTR AMPL: 7.4 mV
MDC IDC PG SERIAL: 7661751
MDC IDC SET LEADCHNL RA PACING AMPLITUDE: 1.5 V
MDC IDC SET LEADCHNL RV PACING PULSEWIDTH: 0.4 ms
MDC IDC STAT BRADY AS VP PERCENT: 1 %
MDC IDC STAT BRADY RA PERCENT PACED: 21 %
MDC IDC STAT BRADY RV PERCENT PACED: 1 %

## 2017-06-03 ENCOUNTER — Other Ambulatory Visit: Payer: Self-pay | Admitting: Internal Medicine

## 2017-06-03 DIAGNOSIS — Z8673 Personal history of transient ischemic attack (TIA), and cerebral infarction without residual deficits: Secondary | ICD-10-CM | POA: Diagnosis not present

## 2017-06-03 DIAGNOSIS — R300 Dysuria: Secondary | ICD-10-CM | POA: Diagnosis not present

## 2017-06-03 DIAGNOSIS — Z1329 Encounter for screening for other suspected endocrine disorder: Secondary | ICD-10-CM | POA: Diagnosis not present

## 2017-06-03 DIAGNOSIS — Z1239 Encounter for other screening for malignant neoplasm of breast: Secondary | ICD-10-CM

## 2017-06-03 DIAGNOSIS — F0391 Unspecified dementia with behavioral disturbance: Secondary | ICD-10-CM | POA: Diagnosis not present

## 2017-06-03 DIAGNOSIS — N183 Chronic kidney disease, stage 3 (moderate): Secondary | ICD-10-CM | POA: Diagnosis not present

## 2017-06-03 DIAGNOSIS — H9193 Unspecified hearing loss, bilateral: Secondary | ICD-10-CM | POA: Diagnosis not present

## 2017-06-03 DIAGNOSIS — I495 Sick sinus syndrome: Secondary | ICD-10-CM | POA: Diagnosis not present

## 2017-06-03 DIAGNOSIS — I10 Essential (primary) hypertension: Secondary | ICD-10-CM | POA: Diagnosis not present

## 2017-06-03 DIAGNOSIS — R202 Paresthesia of skin: Secondary | ICD-10-CM | POA: Diagnosis not present

## 2017-06-03 DIAGNOSIS — F329 Major depressive disorder, single episode, unspecified: Secondary | ICD-10-CM | POA: Diagnosis not present

## 2017-06-03 DIAGNOSIS — K219 Gastro-esophageal reflux disease without esophagitis: Secondary | ICD-10-CM | POA: Diagnosis not present

## 2017-06-03 DIAGNOSIS — Z1231 Encounter for screening mammogram for malignant neoplasm of breast: Secondary | ICD-10-CM | POA: Diagnosis not present

## 2017-06-11 DIAGNOSIS — H524 Presbyopia: Secondary | ICD-10-CM | POA: Diagnosis not present

## 2017-06-11 DIAGNOSIS — E538 Deficiency of other specified B group vitamins: Secondary | ICD-10-CM | POA: Diagnosis not present

## 2017-06-11 DIAGNOSIS — E113293 Type 2 diabetes mellitus with mild nonproliferative diabetic retinopathy without macular edema, bilateral: Secondary | ICD-10-CM | POA: Diagnosis not present

## 2017-06-11 DIAGNOSIS — H52223 Regular astigmatism, bilateral: Secondary | ICD-10-CM | POA: Diagnosis not present

## 2017-06-11 DIAGNOSIS — H5203 Hypermetropia, bilateral: Secondary | ICD-10-CM | POA: Diagnosis not present

## 2017-06-11 DIAGNOSIS — I10 Essential (primary) hypertension: Secondary | ICD-10-CM | POA: Diagnosis not present

## 2017-06-16 ENCOUNTER — Encounter: Payer: Self-pay | Admitting: Emergency Medicine

## 2017-06-16 ENCOUNTER — Emergency Department: Payer: PPO

## 2017-06-16 ENCOUNTER — Other Ambulatory Visit: Payer: Self-pay

## 2017-06-16 ENCOUNTER — Emergency Department
Admission: EM | Admit: 2017-06-16 | Discharge: 2017-06-16 | Disposition: A | Discharge status: Home / Self Care | Payer: PPO | Attending: Emergency Medicine | Admitting: Emergency Medicine

## 2017-06-16 DIAGNOSIS — Z8673 Personal history of transient ischemic attack (TIA), and cerebral infarction without residual deficits: Secondary | ICD-10-CM | POA: Diagnosis not present

## 2017-06-16 DIAGNOSIS — Z950 Presence of cardiac pacemaker: Secondary | ICD-10-CM | POA: Diagnosis not present

## 2017-06-16 DIAGNOSIS — R404 Transient alteration of awareness: Secondary | ICD-10-CM | POA: Diagnosis not present

## 2017-06-16 DIAGNOSIS — R4182 Altered mental status, unspecified: Secondary | ICD-10-CM | POA: Diagnosis not present

## 2017-06-16 DIAGNOSIS — I10 Essential (primary) hypertension: Secondary | ICD-10-CM | POA: Diagnosis not present

## 2017-06-16 DIAGNOSIS — E119 Type 2 diabetes mellitus without complications: Secondary | ICD-10-CM | POA: Diagnosis not present

## 2017-06-16 DIAGNOSIS — Z87891 Personal history of nicotine dependence: Secondary | ICD-10-CM | POA: Diagnosis not present

## 2017-06-16 DIAGNOSIS — R4020 Unspecified coma: Secondary | ICD-10-CM | POA: Diagnosis not present

## 2017-06-16 LAB — CBC WITH DIFFERENTIAL/PLATELET
BASOS ABS: 0 10*3/uL (ref 0–0.1)
Basophils Relative: 0 %
EOS PCT: 4 %
Eosinophils Absolute: 0.3 10*3/uL (ref 0–0.7)
HEMATOCRIT: 38.2 % (ref 35.0–47.0)
Hemoglobin: 12.8 g/dL (ref 12.0–16.0)
LYMPHS PCT: 34 %
Lymphs Abs: 2.4 10*3/uL (ref 1.0–3.6)
MCH: 30.9 pg (ref 26.0–34.0)
MCHC: 33.5 g/dL (ref 32.0–36.0)
MCV: 92.1 fL (ref 80.0–100.0)
MONO ABS: 0.5 10*3/uL (ref 0.2–0.9)
MONOS PCT: 7 %
Neutro Abs: 3.8 10*3/uL (ref 1.4–6.5)
Neutrophils Relative %: 55 %
PLATELETS: 198 10*3/uL (ref 150–440)
RBC: 4.15 MIL/uL (ref 3.80–5.20)
RDW: 12.9 % (ref 11.5–14.5)
WBC: 6.9 10*3/uL (ref 3.6–11.0)

## 2017-06-16 LAB — URINALYSIS, COMPLETE (UACMP) WITH MICROSCOPIC
BILIRUBIN URINE: NEGATIVE
Bacteria, UA: NONE SEEN
GLUCOSE, UA: NEGATIVE mg/dL
Hgb urine dipstick: NEGATIVE
KETONES UR: NEGATIVE mg/dL
LEUKOCYTES UA: NEGATIVE
NITRITE: NEGATIVE
PH: 5 (ref 5.0–8.0)
Protein, ur: 100 mg/dL — AB
SPECIFIC GRAVITY, URINE: 1.018 (ref 1.005–1.030)

## 2017-06-16 LAB — COMPREHENSIVE METABOLIC PANEL
ALT: 15 U/L (ref 14–54)
ANION GAP: 10 (ref 5–15)
AST: 32 U/L (ref 15–41)
Albumin: 3.8 g/dL (ref 3.5–5.0)
Alkaline Phosphatase: 74 U/L (ref 38–126)
BILIRUBIN TOTAL: 0.8 mg/dL (ref 0.3–1.2)
BUN: 29 mg/dL — ABNORMAL HIGH (ref 6–20)
CHLORIDE: 107 mmol/L (ref 101–111)
CO2: 23 mmol/L (ref 22–32)
Calcium: 9.3 mg/dL (ref 8.9–10.3)
Creatinine, Ser: 1.48 mg/dL — ABNORMAL HIGH (ref 0.44–1.00)
GFR calc non Af Amer: 32 mL/min — ABNORMAL LOW (ref >60)
GFR, EST AFRICAN AMERICAN: 37 mL/min — AB (ref >60)
Glucose, Bld: 180 mg/dL — ABNORMAL HIGH (ref 65–99)
Potassium: 4.2 mmol/L (ref 3.5–5.1)
Sodium: 140 mmol/L (ref 135–145)
TOTAL PROTEIN: 6.9 g/dL (ref 6.5–8.1)

## 2017-06-16 LAB — TROPONIN I: Troponin I: 0.03 ng/mL (ref <0.03)

## 2017-06-16 NOTE — ED Triage Notes (Addendum)
Ems from [facility] for facial droop. Pt without facial droop now. No noticeable s/s of stroke. Per ems FSBS 177, Per ems pt started on risperidone recently and per staff has not been acting right since.

## 2017-06-16 NOTE — ED Provider Notes (Signed)
-----------------------------------------   3:25 PM on 06/16/2017 -----------------------------------------  I took over care of this patient from Dr. Mayford KnifeWilliams.  The patient presents for mild altered mental status after recently having been started on risperidone.  There was an apparent history of facial droop however per Dr. Mayford KnifeWilliams there are no focal neurologic deficits at this time.  Per Dr. Mayford KnifeWilliams, the plan will be to follow-up on CT head and pending labs.   If there are no acute abnormalities and the patient continues to have no focal neurological findings, she will be appropriate for discharge back to her facility.  ----------------------------------------- 7:03 PM on 06/16/2017 -----------------------------------------  CT head shows no acute findings, patient's lab workup is unremarkable.  Her UA is negative.  Her troponin is minimally indeterminate, however looking at prior values, it has been elevated in the past and is actually less elevated today.  On reassessment, the patient is completely asymptomatic.  Therefore there is no evidence of ACS or indication for repeat troponin or further workup.  She also has no neuro deficits at this time, and is alert and answering questions appropriately.  At this time, patient is appropriate for discharge back to her facility.   Dionne BucySiadecki, Amarilys Lyles, MD 06/16/17 [phone]

## 2017-06-16 NOTE — ED Provider Notes (Signed)
[facility] Emergency Department Provider Note       Time seen: ----------------------------------------- 2:25 PM on 06/16/2017 ----------------------------------------- Level V caveat: History/ROS limited by altered mental status  I have reviewed the triage vital signs and the nursing notes.  HISTORY   Chief Complaint Altered Mental Status    HPI Monica CageBarbara Choi is a 84 y.o. female with a history of mentioned, anemia, diabetes, GERD, hypertension who presents to the ED for altered mental status.  EMS was sent from [facility] for possible facial droop.  She was not noted to have a facial droop now.  She denies any symptoms.  EMS reports since starting Risperdal she has not been acting herself.  Past Medical History:  Diagnosis Date  . Anemia   . Anxiety   . Arrhythmia   . Diabetes mellitus without complication (HCC)   . Diverticulitis   . GERD (gastroesophageal reflux disease)   . Hypertension   . Presence of permanent cardiac pacemaker    hx/notes 11/25/2016  . Rectal prolapse   . Renal disorder   . Rhabdomyolysis 11/2016  . Sick sinus syndrome (HCC)    hx/notes 11/25/2016  . Sinus node dysfunction (HCC)   . TIA (transient ischemic attack) 07/2016   hx/notes 11/25/2016    Patient Active Problem List   Diagnosis Date Noted  . Encephalopathy 03/02/2017  . Rhabdomyolysis 11/25/2016  . Nonintractable headache   . TIA (transient ischemic attack) 09/17/2016  . Hypertension 07/17/2016  . Sick sinus syndrome (HCC) 07/17/2016    Past Surgical History:  Procedure Laterality Date  . ABDOMINAL HYSTERECTOMY    . ABDOMINAL SURGERY    . CARDIAC SURGERY    . CATARACT EXTRACTION Bilateral   . CHOLECYSTECTOMY    . COLON SURGERY    . HERNIA REPAIR    . INSERT / REPLACE / REMOVE PACEMAKER     hx/notes 11/25/2016  . KNEE SURGERY    . NASAL SINUS SURGERY      Allergies Morphine; Sulfa antibiotics; Amoxicillin; Benzonatate; Buprenorphine; Fish-derived  products; Penicillins; Ciprofloxacin; and Morphine and related  Social History Social History   Tobacco Use  . Smoking status: Former Games developermoker  . Smokeless tobacco: Never Used  Substance Use Topics  . Alcohol use: No  . Drug use: No   Review of Systems Reported possible facial droop and change in mental status  All systems negative/normal/unremarkable except as stated in the HPI  ____________________________________________   PHYSICAL EXAM:  VITAL SIGNS: ED Triage Vitals  Enc Vitals Group     BP 06/16/17 1411 (!) 141/63     Pulse Rate 06/16/17 1411 72     Resp --      Temp 06/16/17 1411 97.9 F (36.6 C)     Temp Source 06/16/17 1411 Oral     SpO2 06/16/17 1411 92 %     Weight 06/16/17 1414 141 lb 8 oz (64.2 kg)     Height 06/16/17 1414 5\' 4"  (1.626 m)     Head Circumference --      Peak Flow --      Pain Score --      Pain Loc --      Pain Edu? --      Excl. in GC? --    Constitutional: Alert but disoriented, well appearing and in no distress. Eyes: Conjunctivae are normal. Normal extraocular movements. ENT   Head: Normocephalic and atraumatic.   Nose: No congestion/rhinnorhea.   Mouth/Throat: Mucous membranes are moist.   Neck:  No stridor. Cardiovascular: Normal rate, regular rhythm. No murmurs, rubs, or gallops. Respiratory: Normal respiratory effort without tachypnea nor retractions. Breath sounds are clear and equal bilaterally. No wheezes/rales/rhonchi. Gastrointestinal: Soft and nontender. Normal bowel sounds Musculoskeletal: Nontender with normal range of motion in extremities. No lower extremity tenderness nor edema. Neurologic:  Normal speech and language. No gross focal neurologic deficits are appreciated.  Strength, sensation and cranial nerves appear to be normal Skin:  Skin is warm, dry and intact. No rash noted. Psychiatric: Patient is pleasantly demented ____________________________________________  ED COURSE:  As part of my medical  decision making, I reviewed the following data within the electronic MEDICAL RECORD NUMBER History obtained from family if available, nursing notes, old chart and ekg, as well as notes from prior ED visits. Patient presented for altered mental status, we will assess with labs and imaging as indicated at this time.   Procedures ____________________________________________   LABS (pertinent positives/negatives)  Labs Reviewed  COMPREHENSIVE METABOLIC PANEL - Abnormal; Notable for the following components:      Result Value   Glucose, Bld 180 (*)    BUN 29 (*)    Creatinine, Ser 1.48 (*)    GFR calc non Af Amer 32 (*)    GFR calc Af Amer 37 (*)    All other components within normal limits  TROPONIN I - Abnormal; Notable for the following components:   Troponin I 0.03 (*)    All other components within normal limits  URINALYSIS, COMPLETE (UACMP) WITH MICROSCOPIC - Abnormal; Notable for the following components:   Color, Urine YELLOW (*)    APPearance HAZY (*)    Protein, ur 100 (*)    Squamous Epithelial / LPF 0-5 (*)    All other components within normal limits  CBC WITH DIFFERENTIAL/PLATELET  CBG MONITORING, ED    RADIOLOGY  CT head Is pending at this time ____________________________________________  DIFFERENTIAL DIAGNOSIS   CVA, dementia, medication side effect, occult infection, dehydration  FINAL ASSESSMENT AND PLAN  Altered mental status   Plan: The patient had presented for possible facial drooping and altered mental status. Patient's labs are pending at this time. Patient's imaging is unremarkable.  I suspect she is likely at her baseline and is having a side effect from the mid risperidone.   Ulice Dash, MD   Note: This note was generated in part or whole with voice recognition software. Voice recognition is usually quite accurate but there are transcription errors that can and very often do occur. I apologize for any typographical errors that were not  detected and corrected.     Emily Filbert, MD 06/21/17 [phone]

## 2017-06-16 NOTE — Discharge Instructions (Signed)
Mrs. Monica Choi had a CT head and lab workup in the emergency department which were negative.  There is no evidence of acute stroke.  We suspect that her

## 2017-06-19 DIAGNOSIS — E538 Deficiency of other specified B group vitamins: Secondary | ICD-10-CM | POA: Diagnosis not present

## 2017-06-26 DIAGNOSIS — E538 Deficiency of other specified B group vitamins: Secondary | ICD-10-CM | POA: Diagnosis not present

## 2017-07-02 DIAGNOSIS — F0391 Unspecified dementia with behavioral disturbance: Secondary | ICD-10-CM | POA: Diagnosis not present

## 2017-07-02 DIAGNOSIS — F068 Other specified mental disorders due to known physiological condition: Secondary | ICD-10-CM | POA: Diagnosis not present

## 2017-07-03 DIAGNOSIS — N183 Chronic kidney disease, stage 3 (moderate): Secondary | ICD-10-CM | POA: Diagnosis not present

## 2017-07-03 DIAGNOSIS — E538 Deficiency of other specified B group vitamins: Secondary | ICD-10-CM | POA: Diagnosis not present

## 2017-07-03 DIAGNOSIS — K219 Gastro-esophageal reflux disease without esophagitis: Secondary | ICD-10-CM | POA: Diagnosis not present

## 2017-07-03 DIAGNOSIS — F329 Major depressive disorder, single episode, unspecified: Secondary | ICD-10-CM | POA: Diagnosis not present

## 2017-07-03 DIAGNOSIS — F0391 Unspecified dementia with behavioral disturbance: Secondary | ICD-10-CM | POA: Diagnosis not present

## 2017-07-03 DIAGNOSIS — I10 Essential (primary) hypertension: Secondary | ICD-10-CM | POA: Diagnosis not present

## 2017-07-16 ENCOUNTER — Ambulatory Visit (INDEPENDENT_AMBULATORY_CARE_PROVIDER_SITE_OTHER): Payer: PPO | Admitting: *Deleted

## 2017-07-16 ENCOUNTER — Telehealth: Payer: Self-pay | Admitting: Cardiology

## 2017-07-16 DIAGNOSIS — I495 Sick sinus syndrome: Secondary | ICD-10-CM | POA: Diagnosis not present

## 2017-07-16 NOTE — Progress Notes (Signed)
Remote pacemaker transmission.   

## 2017-07-16 NOTE — Telephone Encounter (Signed)
Confirmed remote transmission w/ pt son in law.   

## 2017-07-22 ENCOUNTER — Encounter: Payer: Self-pay | Admitting: Cardiology

## 2017-08-21 LAB — CUP PACEART REMOTE DEVICE CHECK
Battery Remaining Longevity: 132 mo
Battery Remaining Percentage: 95.5 %
Brady Statistic AP VP Percent: 1 %
Brady Statistic AS VS Percent: 73 %
Brady Statistic RA Percent Paced: 26 %
Implantable Lead Location: 753862
Implantable Pulse Generator Implant Date: 20160108
Lead Channel Impedance Value: 360 Ohm
Lead Channel Pacing Threshold Pulse Width: 0.4 ms
Lead Channel Sensing Intrinsic Amplitude: 5 mV
Lead Channel Sensing Intrinsic Amplitude: 7.8 mV
Lead Channel Setting Pacing Amplitude: 1.5 V
Lead Channel Setting Sensing Sensitivity: 2 mV
MDC IDC LEAD IMPLANT DT: 20160108
MDC IDC LEAD IMPLANT DT: 20160108
MDC IDC LEAD LOCATION: 753862
MDC IDC MSMT BATTERY VOLTAGE: 2.99 V
MDC IDC MSMT LEADCHNL RA PACING THRESHOLD AMPLITUDE: 0.5 V
MDC IDC MSMT LEADCHNL RV IMPEDANCE VALUE: 530 Ohm
MDC IDC MSMT LEADCHNL RV PACING THRESHOLD AMPLITUDE: 1 V
MDC IDC MSMT LEADCHNL RV PACING THRESHOLD PULSEWIDTH: 0.4 ms
MDC IDC SESS DTM: 20190411165703
MDC IDC SET LEADCHNL RV PACING AMPLITUDE: 1.25 V
MDC IDC SET LEADCHNL RV PACING PULSEWIDTH: 0.4 ms
MDC IDC STAT BRADY AP VS PERCENT: 27 %
MDC IDC STAT BRADY AS VP PERCENT: 1 %
MDC IDC STAT BRADY RV PERCENT PACED: 1 %
Pulse Gen Serial Number: 7661751

## 2017-09-04 DIAGNOSIS — Z79899 Other long term (current) drug therapy: Secondary | ICD-10-CM | POA: Diagnosis not present

## 2017-09-04 DIAGNOSIS — F0391 Unspecified dementia with behavioral disturbance: Secondary | ICD-10-CM | POA: Diagnosis not present

## 2017-09-04 DIAGNOSIS — K5900 Constipation, unspecified: Secondary | ICD-10-CM | POA: Diagnosis not present

## 2017-09-04 DIAGNOSIS — F068 Other specified mental disorders due to known physiological condition: Secondary | ICD-10-CM | POA: Diagnosis not present

## 2017-09-04 DIAGNOSIS — R1032 Left lower quadrant pain: Secondary | ICD-10-CM | POA: Diagnosis not present

## 2017-09-04 DIAGNOSIS — R1031 Right lower quadrant pain: Secondary | ICD-10-CM | POA: Diagnosis not present

## 2017-09-17 DIAGNOSIS — R935 Abnormal findings on diagnostic imaging of other abdominal regions, including retroperitoneum: Secondary | ICD-10-CM | POA: Diagnosis not present

## 2017-10-01 DIAGNOSIS — F329 Major depressive disorder, single episode, unspecified: Secondary | ICD-10-CM | POA: Diagnosis not present

## 2017-10-01 DIAGNOSIS — K623 Rectal prolapse: Secondary | ICD-10-CM | POA: Diagnosis not present

## 2017-10-01 DIAGNOSIS — K219 Gastro-esophageal reflux disease without esophagitis: Secondary | ICD-10-CM | POA: Diagnosis not present

## 2017-10-01 DIAGNOSIS — Z0000 Encounter for general adult medical examination without abnormal findings: Secondary | ICD-10-CM | POA: Diagnosis not present

## 2017-10-01 DIAGNOSIS — F0391 Unspecified dementia with behavioral disturbance: Secondary | ICD-10-CM | POA: Diagnosis not present

## 2017-10-01 DIAGNOSIS — N183 Chronic kidney disease, stage 3 (moderate): Secondary | ICD-10-CM | POA: Diagnosis not present

## 2017-10-01 DIAGNOSIS — I10 Essential (primary) hypertension: Secondary | ICD-10-CM | POA: Diagnosis not present

## 2017-10-15 ENCOUNTER — Ambulatory Visit (INDEPENDENT_AMBULATORY_CARE_PROVIDER_SITE_OTHER): Payer: PPO | Admitting: *Deleted

## 2017-10-15 ENCOUNTER — Telehealth: Payer: Self-pay | Admitting: Cardiology

## 2017-10-15 DIAGNOSIS — I495 Sick sinus syndrome: Secondary | ICD-10-CM

## 2017-10-15 NOTE — Telephone Encounter (Signed)
LMOVM reminding pt to send remote transmission.   

## 2017-10-16 NOTE — Progress Notes (Signed)
Remote pacemaker transmission.   

## 2017-11-16 LAB — CUP PACEART REMOTE DEVICE CHECK
Battery Voltage: 2.99 V
Brady Statistic AP VP Percent: 1 %
Brady Statistic AP VS Percent: 29 %
Brady Statistic AS VS Percent: 70 %
Brady Statistic RV Percent Paced: 1 %
Date Time Interrogation Session: 20190712014051
Implantable Lead Implant Date: 20160108
Implantable Lead Location: 753860
Implantable Lead Model: 1948
Implantable Pulse Generator Implant Date: 20160108
Lead Channel Impedance Value: 550 Ohm
Lead Channel Pacing Threshold Amplitude: 1 V
Lead Channel Pacing Threshold Pulse Width: 0.4 ms
Lead Channel Setting Pacing Amplitude: 1.25 V
Lead Channel Setting Pacing Pulse Width: 0.4 ms
Lead Channel Setting Sensing Sensitivity: 2 mV
MDC IDC LEAD IMPLANT DT: 20160108
MDC IDC LEAD LOCATION: 753859
MDC IDC MSMT BATTERY REMAINING LONGEVITY: 133 mo
MDC IDC MSMT BATTERY REMAINING PERCENTAGE: 95.5 %
MDC IDC MSMT LEADCHNL RA IMPEDANCE VALUE: 380 Ohm
MDC IDC MSMT LEADCHNL RA PACING THRESHOLD AMPLITUDE: 0.5 V
MDC IDC MSMT LEADCHNL RA PACING THRESHOLD PULSEWIDTH: 0.4 ms
MDC IDC MSMT LEADCHNL RA SENSING INTR AMPL: 5 mV
MDC IDC MSMT LEADCHNL RV SENSING INTR AMPL: 7.4 mV
MDC IDC PG SERIAL: 7661751
MDC IDC SET LEADCHNL RA PACING AMPLITUDE: 1.5 V
MDC IDC STAT BRADY AS VP PERCENT: 1 %
MDC IDC STAT BRADY RA PERCENT PACED: 28 %

## 2017-12-08 DIAGNOSIS — G8929 Other chronic pain: Secondary | ICD-10-CM | POA: Diagnosis not present

## 2017-12-08 DIAGNOSIS — K588 Other irritable bowel syndrome: Secondary | ICD-10-CM | POA: Diagnosis not present

## 2017-12-08 DIAGNOSIS — M6281 Muscle weakness (generalized): Secondary | ICD-10-CM | POA: Diagnosis not present

## 2017-12-08 DIAGNOSIS — I10 Essential (primary) hypertension: Secondary | ICD-10-CM | POA: Diagnosis not present

## 2017-12-08 DIAGNOSIS — K219 Gastro-esophageal reflux disease without esophagitis: Secondary | ICD-10-CM | POA: Diagnosis not present

## 2017-12-08 DIAGNOSIS — F0390 Unspecified dementia without behavioral disturbance: Secondary | ICD-10-CM | POA: Diagnosis not present

## 2017-12-08 DIAGNOSIS — E559 Vitamin D deficiency, unspecified: Secondary | ICD-10-CM | POA: Diagnosis not present

## 2017-12-08 DIAGNOSIS — F331 Major depressive disorder, recurrent, moderate: Secondary | ICD-10-CM | POA: Diagnosis not present

## 2017-12-08 DIAGNOSIS — Z7982 Long term (current) use of aspirin: Secondary | ICD-10-CM | POA: Diagnosis not present

## 2017-12-13 DIAGNOSIS — M6281 Muscle weakness (generalized): Secondary | ICD-10-CM | POA: Diagnosis not present

## 2017-12-13 DIAGNOSIS — R41841 Cognitive communication deficit: Secondary | ICD-10-CM | POA: Diagnosis not present

## 2017-12-13 DIAGNOSIS — R2681 Unsteadiness on feet: Secondary | ICD-10-CM | POA: Diagnosis not present

## 2017-12-17 DIAGNOSIS — H34211 Partial retinal artery occlusion, right eye: Secondary | ICD-10-CM | POA: Diagnosis not present

## 2017-12-17 DIAGNOSIS — H35033 Hypertensive retinopathy, bilateral: Secondary | ICD-10-CM | POA: Diagnosis not present

## 2017-12-17 DIAGNOSIS — E113393 Type 2 diabetes mellitus with moderate nonproliferative diabetic retinopathy without macular edema, bilateral: Secondary | ICD-10-CM | POA: Diagnosis not present

## 2017-12-17 DIAGNOSIS — H348312 Tributary (branch) retinal vein occlusion, right eye, stable: Secondary | ICD-10-CM | POA: Diagnosis not present

## 2017-12-18 DIAGNOSIS — I10 Essential (primary) hypertension: Secondary | ICD-10-CM | POA: Diagnosis not present

## 2017-12-18 DIAGNOSIS — E559 Vitamin D deficiency, unspecified: Secondary | ICD-10-CM | POA: Diagnosis not present

## 2017-12-18 DIAGNOSIS — E119 Type 2 diabetes mellitus without complications: Secondary | ICD-10-CM | POA: Diagnosis not present

## 2017-12-18 DIAGNOSIS — E782 Mixed hyperlipidemia: Secondary | ICD-10-CM | POA: Diagnosis not present

## 2017-12-18 DIAGNOSIS — E039 Hypothyroidism, unspecified: Secondary | ICD-10-CM | POA: Diagnosis not present

## 2017-12-22 DIAGNOSIS — F331 Major depressive disorder, recurrent, moderate: Secondary | ICD-10-CM | POA: Diagnosis not present

## 2017-12-22 DIAGNOSIS — F419 Anxiety disorder, unspecified: Secondary | ICD-10-CM | POA: Diagnosis not present

## 2017-12-22 DIAGNOSIS — F0390 Unspecified dementia without behavioral disturbance: Secondary | ICD-10-CM | POA: Diagnosis not present

## 2017-12-29 DIAGNOSIS — F419 Anxiety disorder, unspecified: Secondary | ICD-10-CM | POA: Diagnosis not present

## 2017-12-29 DIAGNOSIS — F0390 Unspecified dementia without behavioral disturbance: Secondary | ICD-10-CM | POA: Diagnosis not present

## 2017-12-29 DIAGNOSIS — F331 Major depressive disorder, recurrent, moderate: Secondary | ICD-10-CM | POA: Diagnosis not present

## 2018-01-01 DIAGNOSIS — I10 Essential (primary) hypertension: Secondary | ICD-10-CM | POA: Diagnosis not present

## 2018-01-01 DIAGNOSIS — E559 Vitamin D deficiency, unspecified: Secondary | ICD-10-CM | POA: Diagnosis not present

## 2018-01-05 DIAGNOSIS — M6281 Muscle weakness (generalized): Secondary | ICD-10-CM | POA: Diagnosis not present

## 2018-01-05 DIAGNOSIS — I10 Essential (primary) hypertension: Secondary | ICD-10-CM | POA: Diagnosis not present

## 2018-01-05 DIAGNOSIS — F419 Anxiety disorder, unspecified: Secondary | ICD-10-CM | POA: Diagnosis not present

## 2018-01-05 DIAGNOSIS — R41841 Cognitive communication deficit: Secondary | ICD-10-CM | POA: Diagnosis not present

## 2018-01-05 DIAGNOSIS — K588 Other irritable bowel syndrome: Secondary | ICD-10-CM | POA: Diagnosis not present

## 2018-01-05 DIAGNOSIS — F331 Major depressive disorder, recurrent, moderate: Secondary | ICD-10-CM | POA: Diagnosis not present

## 2018-01-05 DIAGNOSIS — F0390 Unspecified dementia without behavioral disturbance: Secondary | ICD-10-CM | POA: Diagnosis not present

## 2018-01-05 DIAGNOSIS — Z7982 Long term (current) use of aspirin: Secondary | ICD-10-CM | POA: Diagnosis not present

## 2018-01-05 DIAGNOSIS — R2681 Unsteadiness on feet: Secondary | ICD-10-CM | POA: Diagnosis not present

## 2018-01-05 DIAGNOSIS — E559 Vitamin D deficiency, unspecified: Secondary | ICD-10-CM | POA: Diagnosis not present

## 2018-01-05 DIAGNOSIS — K219 Gastro-esophageal reflux disease without esophagitis: Secondary | ICD-10-CM | POA: Diagnosis not present

## 2018-01-05 DIAGNOSIS — E782 Mixed hyperlipidemia: Secondary | ICD-10-CM | POA: Diagnosis not present

## 2018-01-05 DIAGNOSIS — G8929 Other chronic pain: Secondary | ICD-10-CM | POA: Diagnosis not present

## 2018-01-12 DIAGNOSIS — E782 Mixed hyperlipidemia: Secondary | ICD-10-CM | POA: Diagnosis not present

## 2018-01-12 DIAGNOSIS — F331 Major depressive disorder, recurrent, moderate: Secondary | ICD-10-CM | POA: Diagnosis not present

## 2018-01-12 DIAGNOSIS — F0390 Unspecified dementia without behavioral disturbance: Secondary | ICD-10-CM | POA: Diagnosis not present

## 2018-01-12 DIAGNOSIS — F419 Anxiety disorder, unspecified: Secondary | ICD-10-CM | POA: Diagnosis not present

## 2018-01-14 ENCOUNTER — Telehealth: Payer: Self-pay

## 2018-01-14 ENCOUNTER — Ambulatory Visit (INDEPENDENT_AMBULATORY_CARE_PROVIDER_SITE_OTHER): Payer: PPO | Admitting: *Deleted

## 2018-01-14 DIAGNOSIS — I495 Sick sinus syndrome: Secondary | ICD-10-CM | POA: Diagnosis not present

## 2018-01-14 NOTE — Telephone Encounter (Signed)
LMOVM reminding pt to send remote transmission.   

## 2018-01-18 NOTE — Progress Notes (Signed)
Remote pacemaker transmission.   

## 2018-01-19 DIAGNOSIS — R102 Pelvic and perineal pain: Secondary | ICD-10-CM | POA: Diagnosis not present

## 2018-01-19 DIAGNOSIS — F0390 Unspecified dementia without behavioral disturbance: Secondary | ICD-10-CM | POA: Diagnosis not present

## 2018-01-19 DIAGNOSIS — F419 Anxiety disorder, unspecified: Secondary | ICD-10-CM | POA: Diagnosis not present

## 2018-01-19 DIAGNOSIS — F331 Major depressive disorder, recurrent, moderate: Secondary | ICD-10-CM | POA: Diagnosis not present

## 2018-01-19 DIAGNOSIS — M25551 Pain in right hip: Secondary | ICD-10-CM | POA: Diagnosis not present

## 2018-01-21 ENCOUNTER — Encounter: Payer: Self-pay | Admitting: Cardiology

## 2018-01-24 IMAGING — CT CT ABD-PELV W/ CM
2 of 5 series · 16 of 46 positions shown, 18 images · IV contrast (APPLIED)
Comparison: 09/01/2016

CLINICAL DATA: Lower abdominal pain left-sided with 3 palpable left
breast lumps which are painful. Diarrhea.

EXAM:
CT ABDOMEN AND PELVIS WITH CONTRAST
TECHNIQUE: Multidetector CT imaging of the abdomen and pelvis was performed
using the standard protocol following bolus administration of
intravenous contrast.
CONTRAST:  75mL 69SNOI-XII IOPAMIDOL (69SNOI-XII) INJECTION 61%

[Series 2: routine abd/pel with · axial · 0.72mm/px · z∈[-513,-88]mm · 13 of 97 slices shown, 15 images]
[im 6/97  soft-tissue]
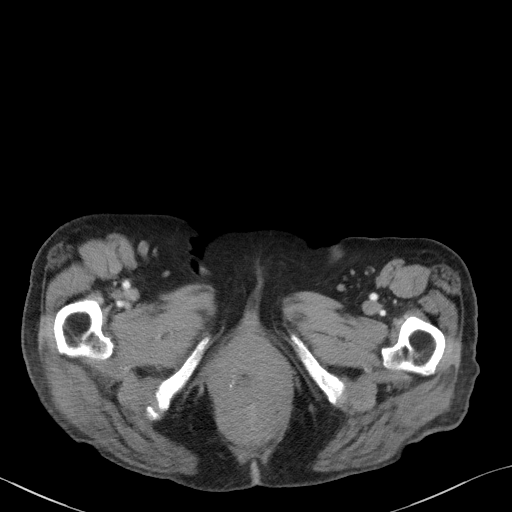
[im 6/97  bone]
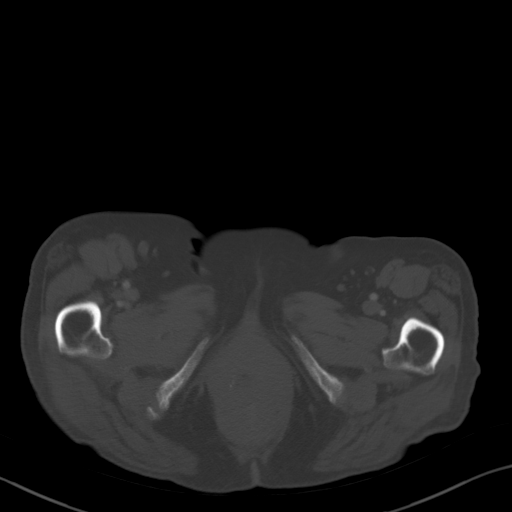
[im 16/97  soft-tissue]
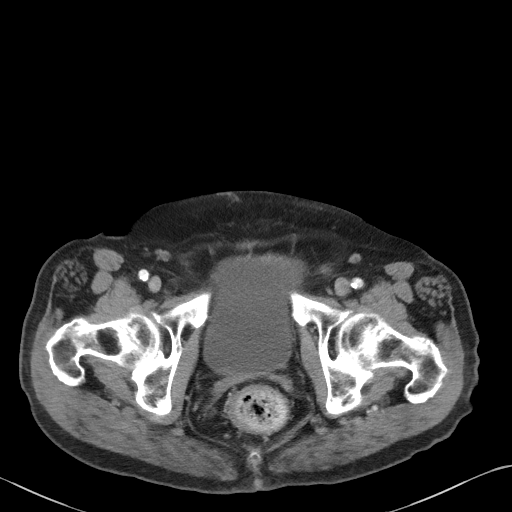
[im 21/97  soft-tissue]
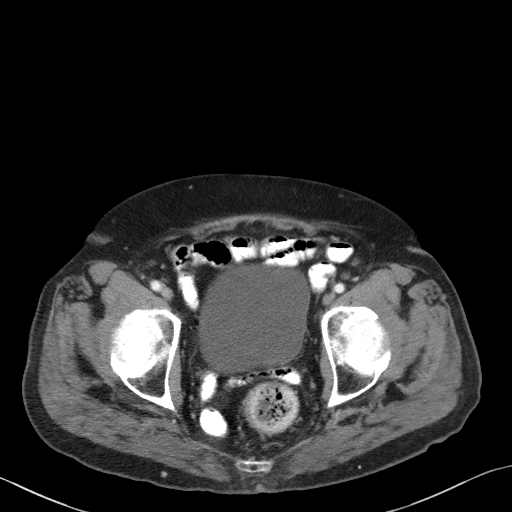
[im 26/97  soft-tissue]
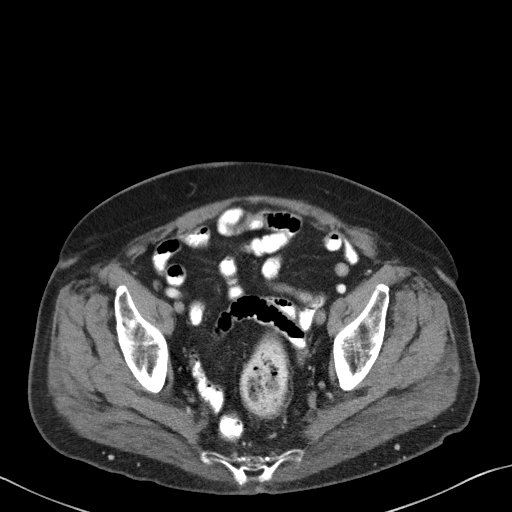
[im 36/97  soft-tissue]
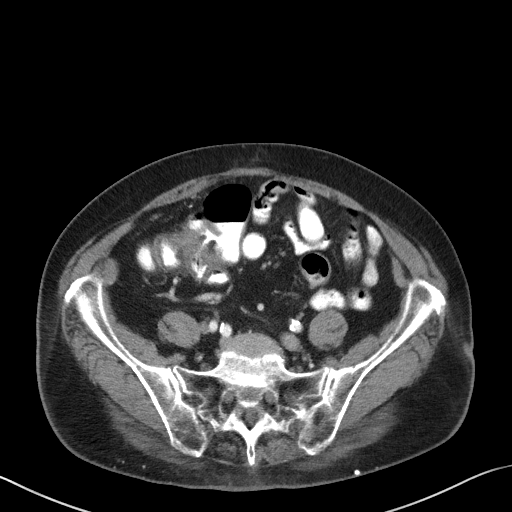
[im 41/97  soft-tissue]
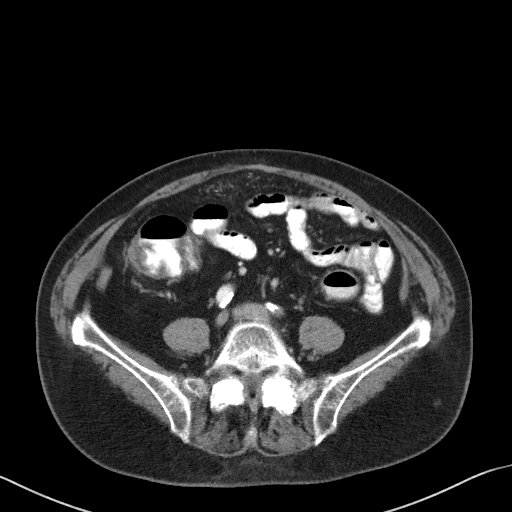
[im 51/97  soft-tissue]
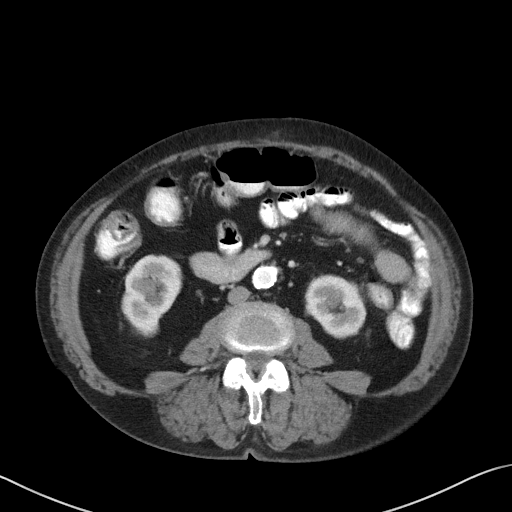
[im 56/97  soft-tissue]
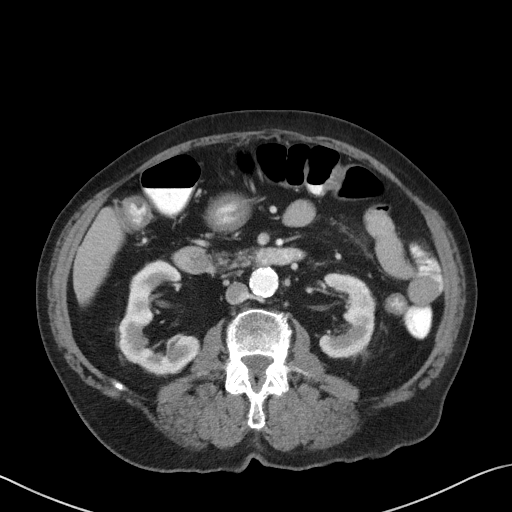
[im 61/97  soft-tissue]
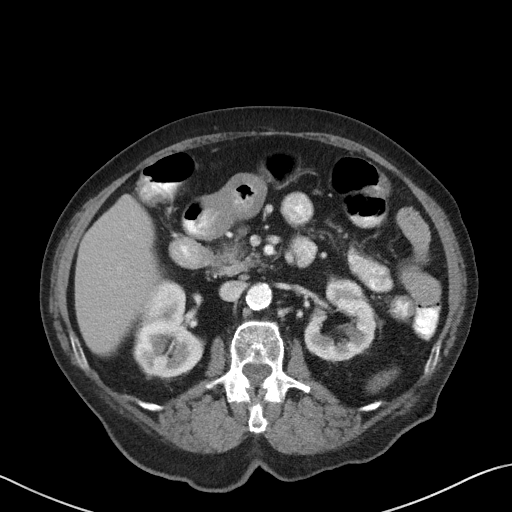
[im 61/97  bone]
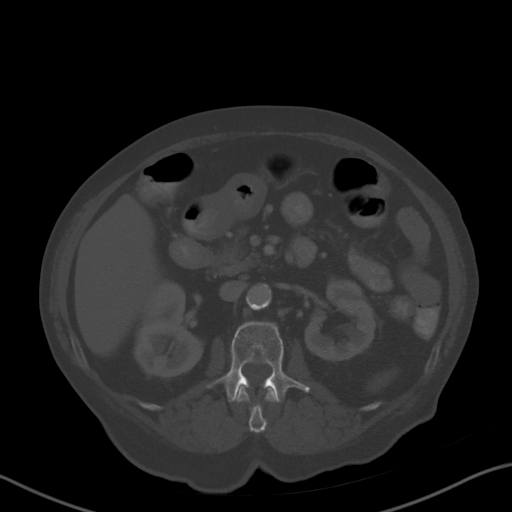
[im 71/97  soft-tissue]
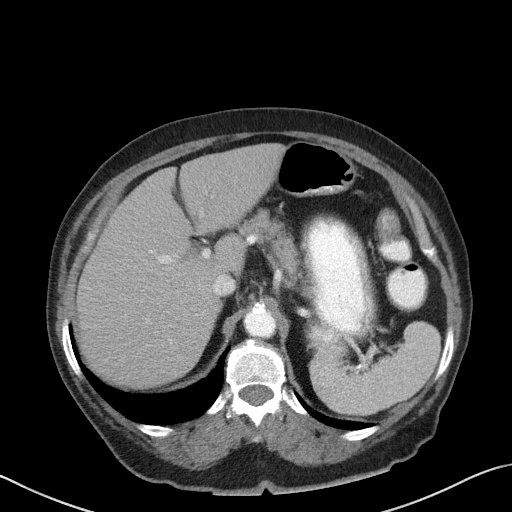
[im 76/97  soft-tissue]
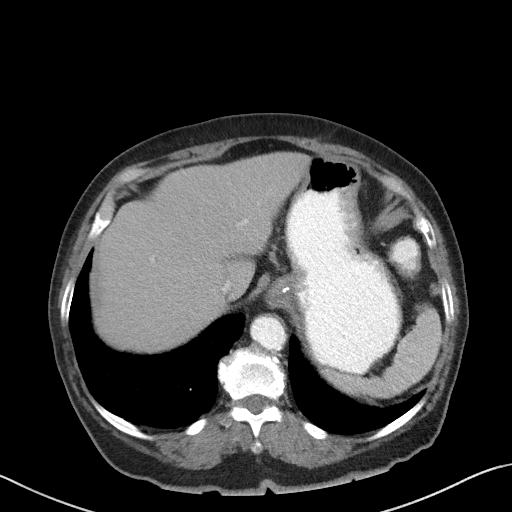
[im 81/97  soft-tissue]
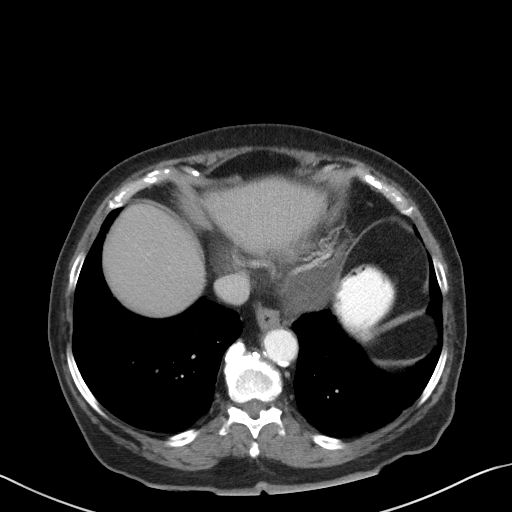
[im 91/97  soft-tissue]
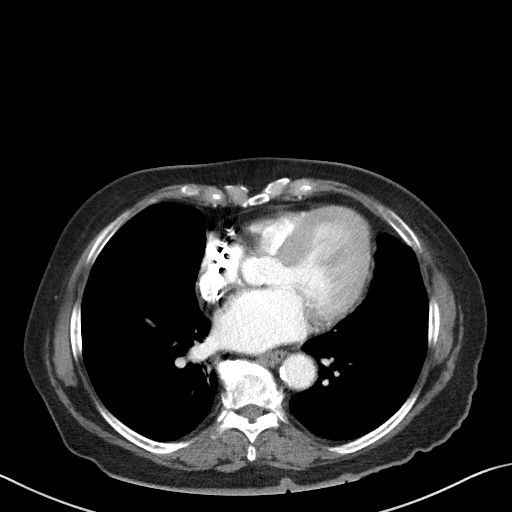

[Series 5: coronal st · coronal · 0.69mm/px · 3 of 86 slices shown]
[im 29/86  soft-tissue]
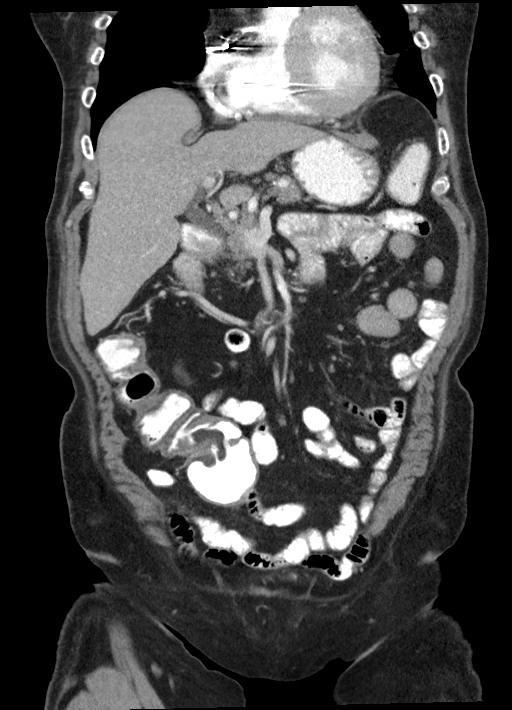
[im 38/86  soft-tissue]
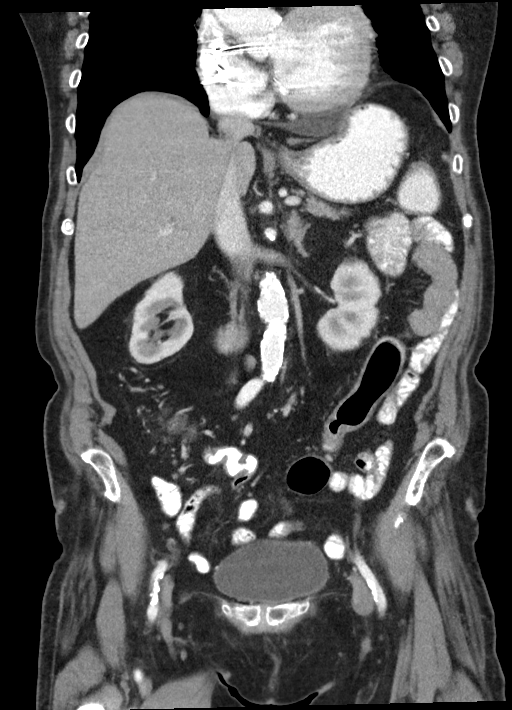
[im 48/86  soft-tissue]
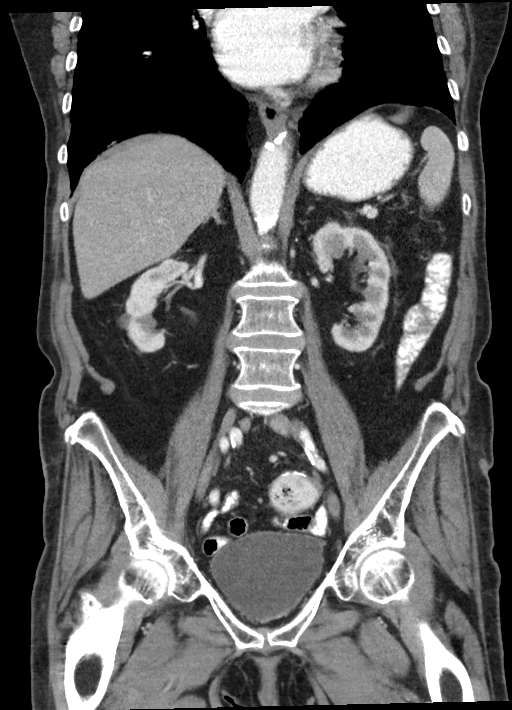

[16 of 46 positions shown; findings below may reference images not displayed]

FINDINGS: Lower chest: Lung bases are within normal. Calcification of the
mitral valve annulus. Cardiac pacer leads are present.

Hepatobiliary: Previous cholecystectomy. Liver and biliary tree are
unremarkable.

Pancreas: Within normal.

Spleen: Within normal.

Adrenals/Urinary Tract: Adrenal glands are normal. Kidneys are
normal in size without hydronephrosis or nephrolithiasis. Few small
right renal cortical hypodensities unchanged and likely cysts.
Ureters and bladder are normal.

Stomach/Bowel: Stomach and small bowel are within normal. Appendix
is not visualized. Surgical suture line over the sigmoid colon.
Minimal diverticulosis of the colon with mild fecal retention over
the rectum.

Vascular/Lymphatic: Moderate calcified plaque over the abdominal
aorta and iliac arteries. No evidence of adenopathy.

Reproductive: Previous hysterectomy.

Other: No free fluid or focal inflammatory change.

Musculoskeletal: Degenerative change of the spine and hips.
IMPRESSION: No acute findings in the abdomen/pelvis.

Few small right renal cortical hypodensities likely cysts and
unchanged.

Aortic Atherosclerosis ([id number]).

## 2018-01-26 DIAGNOSIS — F331 Major depressive disorder, recurrent, moderate: Secondary | ICD-10-CM | POA: Diagnosis not present

## 2018-01-26 DIAGNOSIS — F0390 Unspecified dementia without behavioral disturbance: Secondary | ICD-10-CM | POA: Diagnosis not present

## 2018-01-26 DIAGNOSIS — F419 Anxiety disorder, unspecified: Secondary | ICD-10-CM | POA: Diagnosis not present

## 2018-02-01 ENCOUNTER — Emergency Department (HOSPITAL_COMMUNITY): Payer: PPO

## 2018-02-01 ENCOUNTER — Inpatient Hospital Stay (HOSPITAL_COMMUNITY)
Admission: EM | Admit: 2018-02-01 | Discharge: 2018-03-07 | DRG: 064 | Disposition: E | Payer: PPO | Source: Skilled Nursing Facility | Attending: Internal Medicine | Admitting: Internal Medicine

## 2018-02-01 ENCOUNTER — Other Ambulatory Visit: Payer: Self-pay

## 2018-02-01 ENCOUNTER — Encounter (HOSPITAL_COMMUNITY): Payer: Self-pay

## 2018-02-01 DIAGNOSIS — F0390 Unspecified dementia without behavioral disturbance: Secondary | ICD-10-CM

## 2018-02-01 DIAGNOSIS — Z79899 Other long term (current) drug therapy: Secondary | ICD-10-CM

## 2018-02-01 DIAGNOSIS — D631 Anemia in chronic kidney disease: Secondary | ICD-10-CM | POA: Diagnosis present

## 2018-02-01 DIAGNOSIS — G3183 Dementia with Lewy bodies: Secondary | ICD-10-CM | POA: Diagnosis present

## 2018-02-01 DIAGNOSIS — Z66 Do not resuscitate: Secondary | ICD-10-CM | POA: Diagnosis present

## 2018-02-01 DIAGNOSIS — R55 Syncope and collapse: Secondary | ICD-10-CM | POA: Diagnosis present

## 2018-02-01 DIAGNOSIS — R569 Unspecified convulsions: Secondary | ICD-10-CM | POA: Diagnosis present

## 2018-02-01 DIAGNOSIS — Z7982 Long term (current) use of aspirin: Secondary | ICD-10-CM

## 2018-02-01 DIAGNOSIS — I63422 Cerebral infarction due to embolism of left anterior cerebral artery: Principal | ICD-10-CM | POA: Diagnosis present

## 2018-02-01 DIAGNOSIS — K219 Gastro-esophageal reflux disease without esophagitis: Secondary | ICD-10-CM | POA: Diagnosis present

## 2018-02-01 DIAGNOSIS — Z9049 Acquired absence of other specified parts of digestive tract: Secondary | ICD-10-CM

## 2018-02-01 DIAGNOSIS — Z882 Allergy status to sulfonamides status: Secondary | ICD-10-CM

## 2018-02-01 DIAGNOSIS — R29708 NIHSS score 8: Secondary | ICD-10-CM | POA: Diagnosis present

## 2018-02-01 DIAGNOSIS — Z950 Presence of cardiac pacemaker: Secondary | ICD-10-CM

## 2018-02-01 DIAGNOSIS — I6340 Cerebral infarction due to embolism of unspecified cerebral artery: Secondary | ICD-10-CM | POA: Diagnosis not present

## 2018-02-01 DIAGNOSIS — I672 Cerebral atherosclerosis: Secondary | ICD-10-CM | POA: Diagnosis present

## 2018-02-01 DIAGNOSIS — E1122 Type 2 diabetes mellitus with diabetic chronic kidney disease: Secondary | ICD-10-CM | POA: Diagnosis present

## 2018-02-01 DIAGNOSIS — E785 Hyperlipidemia, unspecified: Secondary | ICD-10-CM | POA: Diagnosis present

## 2018-02-01 DIAGNOSIS — R0902 Hypoxemia: Secondary | ICD-10-CM

## 2018-02-01 DIAGNOSIS — F419 Anxiety disorder, unspecified: Secondary | ICD-10-CM | POA: Diagnosis present

## 2018-02-01 DIAGNOSIS — E870 Hyperosmolality and hypernatremia: Secondary | ICD-10-CM | POA: Diagnosis present

## 2018-02-01 DIAGNOSIS — Z515 Encounter for palliative care: Secondary | ICD-10-CM

## 2018-02-01 DIAGNOSIS — N179 Acute kidney failure, unspecified: Secondary | ICD-10-CM | POA: Diagnosis present

## 2018-02-01 DIAGNOSIS — I160 Hypertensive urgency: Secondary | ICD-10-CM

## 2018-02-01 DIAGNOSIS — I639 Cerebral infarction, unspecified: Secondary | ICD-10-CM | POA: Diagnosis not present

## 2018-02-01 DIAGNOSIS — I6523 Occlusion and stenosis of bilateral carotid arteries: Secondary | ICD-10-CM | POA: Diagnosis present

## 2018-02-01 DIAGNOSIS — Z881 Allergy status to other antibiotic agents status: Secondary | ICD-10-CM

## 2018-02-01 DIAGNOSIS — F329 Major depressive disorder, single episode, unspecified: Secondary | ICD-10-CM | POA: Diagnosis present

## 2018-02-01 DIAGNOSIS — R531 Weakness: Secondary | ICD-10-CM | POA: Diagnosis not present

## 2018-02-01 DIAGNOSIS — R4701 Aphasia: Secondary | ICD-10-CM | POA: Diagnosis present

## 2018-02-01 DIAGNOSIS — G459 Transient cerebral ischemic attack, unspecified: Secondary | ICD-10-CM | POA: Diagnosis not present

## 2018-02-01 DIAGNOSIS — L89319 Pressure ulcer of right buttock, unspecified stage: Secondary | ICD-10-CM | POA: Diagnosis present

## 2018-02-01 DIAGNOSIS — Z8249 Family history of ischemic heart disease and other diseases of the circulatory system: Secondary | ICD-10-CM

## 2018-02-01 DIAGNOSIS — R4020 Unspecified coma: Secondary | ICD-10-CM | POA: Diagnosis not present

## 2018-02-01 DIAGNOSIS — E1165 Type 2 diabetes mellitus with hyperglycemia: Secondary | ICD-10-CM | POA: Diagnosis present

## 2018-02-01 DIAGNOSIS — Z91013 Allergy to seafood: Secondary | ICD-10-CM

## 2018-02-01 DIAGNOSIS — E1151 Type 2 diabetes mellitus with diabetic peripheral angiopathy without gangrene: Secondary | ICD-10-CM | POA: Diagnosis present

## 2018-02-01 DIAGNOSIS — N183 Chronic kidney disease, stage 3 (moderate): Secondary | ICD-10-CM | POA: Diagnosis present

## 2018-02-01 DIAGNOSIS — R1310 Dysphagia, unspecified: Secondary | ICD-10-CM | POA: Diagnosis present

## 2018-02-01 DIAGNOSIS — I495 Sick sinus syndrome: Secondary | ICD-10-CM | POA: Diagnosis present

## 2018-02-01 DIAGNOSIS — Z791 Long term (current) use of non-steroidal anti-inflammatories (NSAID): Secondary | ICD-10-CM

## 2018-02-01 DIAGNOSIS — G9341 Metabolic encephalopathy: Secondary | ICD-10-CM | POA: Diagnosis present

## 2018-02-01 DIAGNOSIS — I10 Essential (primary) hypertension: Secondary | ICD-10-CM | POA: Diagnosis not present

## 2018-02-01 DIAGNOSIS — Z885 Allergy status to narcotic agent status: Secondary | ICD-10-CM

## 2018-02-01 DIAGNOSIS — J9601 Acute respiratory failure with hypoxia: Secondary | ICD-10-CM | POA: Diagnosis present

## 2018-02-01 DIAGNOSIS — Z87891 Personal history of nicotine dependence: Secondary | ICD-10-CM

## 2018-02-01 DIAGNOSIS — E869 Volume depletion, unspecified: Secondary | ICD-10-CM | POA: Diagnosis present

## 2018-02-01 DIAGNOSIS — Z880 Allergy status to penicillin: Secondary | ICD-10-CM

## 2018-02-01 DIAGNOSIS — L8990 Pressure ulcer of unspecified site, unspecified stage: Secondary | ICD-10-CM

## 2018-02-01 DIAGNOSIS — G8191 Hemiplegia, unspecified affecting right dominant side: Secondary | ICD-10-CM | POA: Diagnosis present

## 2018-02-01 DIAGNOSIS — I129 Hypertensive chronic kidney disease with stage 1 through stage 4 chronic kidney disease, or unspecified chronic kidney disease: Secondary | ICD-10-CM | POA: Diagnosis present

## 2018-02-01 DIAGNOSIS — I351 Nonrheumatic aortic (valve) insufficiency: Secondary | ICD-10-CM | POA: Diagnosis not present

## 2018-02-01 DIAGNOSIS — J9811 Atelectasis: Secondary | ICD-10-CM | POA: Diagnosis not present

## 2018-02-01 DIAGNOSIS — F0280 Dementia in other diseases classified elsewhere without behavioral disturbance: Secondary | ICD-10-CM | POA: Diagnosis present

## 2018-02-01 DIAGNOSIS — R4182 Altered mental status, unspecified: Secondary | ICD-10-CM | POA: Diagnosis not present

## 2018-02-01 DIAGNOSIS — Z90710 Acquired absence of both cervix and uterus: Secondary | ICD-10-CM

## 2018-02-01 DIAGNOSIS — F0150 Vascular dementia without behavioral disturbance: Secondary | ICD-10-CM | POA: Diagnosis not present

## 2018-02-01 DIAGNOSIS — Z8673 Personal history of transient ischemic attack (TIA), and cerebral infarction without residual deficits: Secondary | ICD-10-CM

## 2018-02-01 DIAGNOSIS — Z993 Dependence on wheelchair: Secondary | ICD-10-CM

## 2018-02-01 HISTORY — DX: Dementia in other diseases classified elsewhere, unspecified severity, without behavioral disturbance, psychotic disturbance, mood disturbance, and anxiety: F02.80

## 2018-02-01 HISTORY — DX: Delusional disorders: F22

## 2018-02-01 HISTORY — DX: Dementia with Lewy bodies: G31.83

## 2018-02-01 LAB — I-STAT CHEM 8, ED
BUN: 28 mg/dL — ABNORMAL HIGH (ref 8–23)
CALCIUM ION: 1.19 mmol/L (ref 1.15–1.40)
CHLORIDE: 107 mmol/L (ref 98–111)
CREATININE: 1.3 mg/dL — AB (ref 0.44–1.00)
Glucose, Bld: 134 mg/dL — ABNORMAL HIGH (ref 70–99)
HCT: 42 % (ref 36.0–46.0)
Hemoglobin: 14.3 g/dL (ref 12.0–15.0)
POTASSIUM: 3.9 mmol/L (ref 3.5–5.1)
Sodium: 140 mmol/L (ref 135–145)
TCO2: 26 mmol/L (ref 22–32)

## 2018-02-01 LAB — URINALYSIS, ROUTINE W REFLEX MICROSCOPIC
BILIRUBIN URINE: NEGATIVE
Bacteria, UA: NONE SEEN
GLUCOSE, UA: NEGATIVE mg/dL
HGB URINE DIPSTICK: NEGATIVE
Ketones, ur: NEGATIVE mg/dL
Leukocytes, UA: NEGATIVE
NITRITE: NEGATIVE
PH: 7 (ref 5.0–8.0)
Protein, ur: 30 mg/dL — AB
SPECIFIC GRAVITY, URINE: 1.009 (ref 1.005–1.030)

## 2018-02-01 LAB — CBC
HCT: 44 % (ref 36.0–46.0)
Hemoglobin: 13.9 g/dL (ref 12.0–15.0)
MCH: 30.5 pg (ref 26.0–34.0)
MCHC: 31.6 g/dL (ref 30.0–36.0)
MCV: 96.7 fL (ref 80.0–100.0)
Platelets: 273 10*3/uL (ref 150–400)
RBC: 4.55 MIL/uL (ref 3.87–5.11)
RDW: 11.9 % (ref 11.5–15.5)
WBC: 9.1 10*3/uL (ref 4.0–10.5)
nRBC: 0 % (ref 0.0–0.2)

## 2018-02-01 LAB — DIFFERENTIAL
ABS IMMATURE GRANULOCYTES: 0.04 10*3/uL (ref 0.00–0.07)
BASOS ABS: 0.1 10*3/uL (ref 0.0–0.1)
BASOS PCT: 1 %
Eosinophils Absolute: 0.2 10*3/uL (ref 0.0–0.5)
Eosinophils Relative: 2 %
IMMATURE GRANULOCYTES: 0 %
Lymphocytes Relative: 24 %
Lymphs Abs: 2.2 10*3/uL (ref 0.7–4.0)
Monocytes Absolute: 0.5 10*3/uL (ref 0.1–1.0)
Monocytes Relative: 5 %
NEUTROS ABS: 6.2 10*3/uL (ref 1.7–7.7)
NEUTROS PCT: 68 %

## 2018-02-01 LAB — RAPID URINE DRUG SCREEN, HOSP PERFORMED
Amphetamines: NOT DETECTED
BARBITURATES: NOT DETECTED
Benzodiazepines: NOT DETECTED
Cocaine: NOT DETECTED
Opiates: NOT DETECTED
Tetrahydrocannabinol: NOT DETECTED

## 2018-02-01 LAB — COMPREHENSIVE METABOLIC PANEL
ALBUMIN: 3.9 g/dL (ref 3.5–5.0)
ALT: 17 U/L (ref 0–44)
AST: 27 U/L (ref 15–41)
Alkaline Phosphatase: 94 U/L (ref 38–126)
Anion gap: 11 (ref 5–15)
BUN: 26 mg/dL — AB (ref 8–23)
CHLORIDE: 105 mmol/L (ref 98–111)
CO2: 22 mmol/L (ref 22–32)
CREATININE: 1.28 mg/dL — AB (ref 0.44–1.00)
Calcium: 9.9 mg/dL (ref 8.9–10.3)
GFR calc Af Amer: 44 mL/min — ABNORMAL LOW (ref >60)
GFR calc non Af Amer: 38 mL/min — ABNORMAL LOW (ref >60)
Glucose, Bld: 130 mg/dL — ABNORMAL HIGH (ref 70–99)
Potassium: 4.8 mmol/L (ref 3.5–5.1)
SODIUM: 138 mmol/L (ref 135–145)
Total Bilirubin: 0.9 mg/dL (ref 0.3–1.2)
Total Protein: 7 g/dL (ref 6.5–8.1)

## 2018-02-01 LAB — PROTIME-INR
INR: 0.93
Prothrombin Time: 12.4 seconds (ref 11.4–15.2)

## 2018-02-01 LAB — APTT: APTT: 24 s (ref 24–36)

## 2018-02-01 LAB — I-STAT TROPONIN, ED: Troponin i, poc: 0.01 ng/mL (ref 0.00–0.08)

## 2018-02-01 LAB — ETHANOL: Alcohol, Ethyl (B): <10 mg/dL (ref <10)

## 2018-02-01 MED ORDER — PANTOPRAZOLE SODIUM 20 MG PO TBEC
20.0000 mg | DELAYED_RELEASE_TABLET | Freq: Every day | ORAL | Status: DC
  Administered 2018-02-04: 20 mg via ORAL
  Filled 2018-02-01 (×3): qty 1

## 2018-02-01 MED ORDER — ACETAMINOPHEN 650 MG RE SUPP
650.0000 mg | RECTAL | Status: DC | PRN
  Administered 2018-02-03 – 2018-02-09 (×4): 650 mg via RECTAL
  Filled 2018-02-01 (×4): qty 1

## 2018-02-01 MED ORDER — STROKE: EARLY STAGES OF RECOVERY BOOK
Freq: Once | Status: AC
  Administered 2018-02-02: 16:00:00
  Filled 2018-02-01: qty 1

## 2018-02-01 MED ORDER — DICYCLOMINE HCL 10 MG PO CAPS
10.0000 mg | ORAL_CAPSULE | Freq: Three times a day (TID) | ORAL | Status: DC | PRN
  Filled 2018-02-01: qty 1

## 2018-02-01 MED ORDER — ASPIRIN 81 MG PO TBEC: 81.0000 mg | DELAYED_RELEASE_TABLET | Freq: Every day | ORAL | Status: DC

## 2018-02-01 MED ORDER — ACETAMINOPHEN 160 MG/5ML PO SOLN: 650.0000 mg | ORAL | Status: DC | PRN

## 2018-02-01 MED ORDER — DULOXETINE HCL 30 MG PO CPEP
30.0000 mg | ORAL_CAPSULE | Freq: Two times a day (BID) | ORAL | Status: DC
  Administered 2018-02-01 – 2018-02-05 (×5): 30 mg via ORAL
  Filled 2018-02-01 (×6): qty 1

## 2018-02-01 MED ORDER — ASPIRIN 300 MG RE SUPP
300.0000 mg | Freq: Every day | RECTAL | Status: DC
  Administered 2018-02-02 – 2018-02-03 (×2): 300 mg via RECTAL
  Filled 2018-02-01 (×2): qty 1

## 2018-02-01 MED ORDER — SODIUM CHLORIDE 0.9 % IV SOLN
INTRAVENOUS | Status: AC
  Administered 2018-02-01 – 2018-02-02 (×2): via INTRAVENOUS

## 2018-02-01 MED ORDER — ACETAMINOPHEN 325 MG PO TABS: 650.0000 mg | ORAL_TABLET | ORAL | Status: DC | PRN

## 2018-02-01 MED ORDER — SIMVASTATIN 5 MG PO TABS
10.0000 mg | ORAL_TABLET | Freq: Every day | ORAL | Status: DC
  Administered 2018-02-01 – 2018-02-04 (×3): 10 mg via ORAL
  Filled 2018-02-01 (×3): qty 2
  Filled 2018-02-01: qty 1

## 2018-02-01 MED ORDER — VITAMIN D 1000 UNITS PO TABS
2000.0000 [IU] | ORAL_TABLET | Freq: Every day | ORAL | Status: DC
  Administered 2018-02-04 – 2018-02-05 (×2): 2000 [IU] via ORAL
  Filled 2018-02-01 (×2): qty 2

## 2018-02-01 MED ORDER — VITAMIN B-12 1000 MCG PO TABS
1000.0000 ug | ORAL_TABLET | Freq: Every day | ORAL | Status: DC
  Administered 2018-02-04 – 2018-02-07 (×4): 1000 ug via ORAL
  Filled 2018-02-01 (×6): qty 1

## 2018-02-01 MED ORDER — ASPIRIN 325 MG PO TABS
325.0000 mg | ORAL_TABLET | Freq: Every day | ORAL | Status: DC
  Administered 2018-02-01 – 2018-02-05 (×3): 325 mg via ORAL
  Filled 2018-02-01 (×3): qty 1

## 2018-02-01 MED ORDER — HYDRALAZINE HCL 20 MG/ML IJ SOLN
10.0000 mg | INTRAMUSCULAR | Status: DC | PRN
  Administered 2018-02-02 – 2018-02-08 (×11): 10 mg via INTRAVENOUS
  Filled 2018-02-01 (×12): qty 1

## 2018-02-01 MED ORDER — RISPERIDONE 0.5 MG PO TABS
0.2500 mg | ORAL_TABLET | Freq: Every day | ORAL | Status: DC
  Administered 2018-02-01 – 2018-02-04 (×3): 0.25 mg via ORAL
  Filled 2018-02-01 (×3): qty 1

## 2018-02-01 MED ORDER — RAMIPRIL 5 MG PO CAPS
10.0000 mg | ORAL_CAPSULE | Freq: Every day | ORAL | Status: DC
  Administered 2018-02-01 – 2018-02-05 (×3): 10 mg via ORAL
  Filled 2018-02-01 (×3): qty 1
  Filled 2018-02-01: qty 2
  Filled 2018-02-01 (×2): qty 1
  Filled 2018-02-01: qty 2

## 2018-02-01 NOTE — H&P (Signed)
History and Physical    Monica Choi:[id number] DOB: [birth year] DOA: 01/08/2018  PCP: [facility]  Patient coming from: Skilled nursing facility.  Chief Complaint: Right-sided weakness.  History obtained from ER physician and patient's nurse.  No family at the bedside patient has dementia and is not contributing to the history.  HPI: Monica Choi is a 84 y.o. female with dementia, wheelchair-bound, hypertension, diabetes mellitus type 2 as per the chart, sinus node dysfunction status post pacemaker placement, hyperlipidemia hypertension was brought to the ER after patient was found to be having right-sided weakness and difficulty talking.  This happened around noontime today.  Patient was brought to the ER and the nurse found the patient was finding it difficult to move the right upper and right lower extremity and also had difficulty bringing out words.  Symptoms resolved in 15 minutes after reaching ER.  ED Course: In the ER on exam initially patient was found to be weak on the right upper and lower extremity and difficulty speaking which resolved in 15 minutes.  CT head showed multifocal cerebral infarcts which were completed.  EKG shows paced rhythm.  Patient was evaluated by neurologist and patient admitted for further TIA work-up.  Unable to do MRI due to patient having pacemaker.  Patient has passed swallow.  Patient's blood pressure initially was more than 220 systolic.  Review of Systems: As per HPI, rest all negative.   Past Medical History:  Diagnosis Date  . Anemia   . Anxiety   . Arrhythmia   . Diabetes mellitus without complication (HCC)   . Diverticulitis   . GERD (gastroesophageal reflux disease)   . Hypertension   . Presence of permanent cardiac pacemaker    hx/notes 11/25/2016  . Rectal prolapse   . Renal disorder   . Rhabdomyolysis 11/2016  . Sick sinus syndrome (HCC)    hx/notes 11/25/2016  . Sinus node dysfunction (HCC)   . TIA (transient  ischemic attack) 07/2016   hx/notes 11/25/2016    Past Surgical History:  Procedure Laterality Date  . ABDOMINAL HYSTERECTOMY    . ABDOMINAL SURGERY    . CARDIAC SURGERY    . CATARACT EXTRACTION Bilateral   . CHOLECYSTECTOMY    . COLON SURGERY    . HERNIA REPAIR    . INSERT / REPLACE / REMOVE PACEMAKER     hx/notes 11/25/2016  . KNEE SURGERY    . NASAL SINUS SURGERY       reports that she has quit smoking. She has never used smokeless tobacco. She reports that she does not drink alcohol or use drugs.  Allergies  Allergen Reactions  . Morphine Nausea And Vomiting  . Sulfa Antibiotics Anaphylaxis and Rash  . Amoxicillin Other (See Comments)  . Benzonatate Other (See Comments)  . Buprenorphine Nausea And Vomiting  . Fish-Derived Products Nausea Only  . Penicillins Hives    Has patient had a PCN reaction causing immediate rash, facial/tongue/throat swelling, SOB or lightheadedness with hypotension: Yes Has patient had a PCN reaction causing severe rash involving mucus membranes or skin necrosis: Unknown Has patient had a PCN reaction that required hospitalization: Unknown Has patient had a PCN reaction occurring within the last 10 years: No If all of the above answers are "NO", then may proceed with Cephalosporin use.   . Ciprofloxacin Itching and Rash  . Morphine And Related Nausea And Vomiting    Family History  Problem Relation Age of Onset  . Heart attack Father   .  Heart attack Brother     Prior to Admission medications   Medication Sig Start Date End Date Taking? Authorizing Provider  aspirin EC 81 MG EC tablet Take 1 tablet (81 mg total) by mouth daily. 09/19/16  Yes Tillman Sers, DO  Cholecalciferol (VITAMIN D) 2000 units CAPS Take 2,000 Units by mouth daily.    Yes [provider]  dicyclomine (BENTYL) 10 MG capsule Take 10 mg by mouth every 8 (eight) hours as needed for spasms.   Yes [provider]  DULoxetine (CYMBALTA) 30 MG capsule Take  30 mg by mouth 2 (two) times daily.   Yes [provider]  meloxicam (MOBIC) 7.5 MG tablet Take 7.5 mg by mouth daily as needed for pain.   Yes [provider]  pantoprazole (PROTONIX) 20 MG tablet Take 20 mg by mouth daily. 04/08/16  Yes [provider]  ramipril (ALTACE) 10 MG capsule Take 10 mg by mouth daily.   Yes [provider]  risperiDONE (RISPERDAL) 0.25 MG tablet Take 0.25 mg by mouth at bedtime.    Yes [provider]  simvastatin (ZOCOR) 10 MG tablet Take 10 mg by mouth at bedtime.   Yes [provider]  vitamin B-12 (CYANOCOBALAMIN) 1000 MCG tablet Take 1,000 mcg by mouth daily.   Yes [provider]  carvedilol (COREG) 12.5 MG tablet Take 1 tablet (12.5 mg total) by mouth 2 (two) times daily. Patient not taking: Reported on 01/31/2018 07/17/16   Regan Lemming, MD  metoCLOPramide (REGLAN) 10 MG tablet Take 1 tablet (10 mg total) by mouth every 6 (six) hours as needed (for nausea/headache). Patient not taking: Reported on 06/16/2017 09/16/16   Molpus, John, MD  ramipril (ALTACE) 5 MG capsule Take 1 capsule (5 mg total) by mouth daily. Patient not taking: Reported on 01/09/2018 01/19/17   Sharyn Creamer, MD  traMADol (ULTRAM) 50 MG tablet Take 1 tablet (50 mg total) by mouth every 12 (twelve) hours as needed for severe pain. Patient not taking: Reported on 01/18/2017 11/28/16   Marthenia Rolling, DO    Physical Exam: Vitals:   01/28/2018 1815 01/27/2018 1945 01/27/2018 2015 02/03/2018 2045  BP: (!) 198/178 (!) 228/123 (!) 179/87 (!) 189/118  Pulse:  90 88 97  Resp:      Temp:      TempSrc:      SpO2: (!) 89% 98% 98% 98%  Weight:      Height:          Constitutional: Moderately built and nourished. Vitals:   01/14/2018 1815 01/15/2018 1945 01/16/2018 2015 01/31/2018 2045  BP: (!) 198/178 (!) 228/123 (!) 179/87 (!) 189/118  Pulse:  90 88 97  Resp:      Temp:      TempSrc:      SpO2: (!) 89% 98% 98% 98%  Weight:      Height:        Eyes: Anicteric no pallor. ENMT: No discharge from the ears eyes nose or mouth. Neck: No mass felt.  No neck rigidity.  No JVD appreciated. Respiratory: No rhonchi or crepitations. Cardiovascular: S1-S2 heard no murmurs appreciated. Abdomen: Soft nontender bowel sounds present. Musculoskeletal: No edema.  No joint effusion. Skin: No rash. Neurologic: Alert awake oriented to her name.  Moves all extremities 5 x 5.  Pupils are equal and reacting to light.  No facial asymmetry. Psychiatric: Only oriented to her name.   Labs on Admission: I have personally reviewed following labs and imaging studies  CBC: Recent Labs  Lab February 02, 2018 1613 2018/02/02 1649  WBC 9.1  --   NEUTROABS 6.2  --   HGB 13.9 14.3  HCT 44.0 42.0  MCV 96.7  --   PLT 273  --    Basic Metabolic Panel: Recent Labs  Lab Feb 02, 2018 1613 Feb 02, 2018 1649  NA 138 140  K 4.8 3.9  CL 105 107  CO2 22  --   GLUCOSE 130* 134*  BUN 26* 28*  CREATININE 1.28* 1.30*  CALCIUM 9.9  --    GFR: Estimated Creatinine Clearance: 29.3 mL/min (A) (by C-G formula based on SCr of 1.3 mg/dL (H)). Liver Function Tests: Recent Labs  Lab 02-Feb-2018 1613  AST 27  ALT 17  ALKPHOS 94  BILITOT 0.9  PROT 7.0  ALBUMIN 3.9   No results for input(s): LIPASE, AMYLASE in the last 168 hours. No results for input(s): AMMONIA in the last 168 hours. Coagulation Profile: Recent Labs  Lab February 02, 2018 1613  INR 0.93   Cardiac Enzymes: No results for input(s): CKTOTAL, CKMB, CKMBINDEX, TROPONINI in the last 168 hours. BNP (last 3 results) No results for input(s): PROBNP in the last 8760 hours. HbA1C: No results for input(s): HGBA1C in the last 72 hours. CBG: No results for input(s): GLUCAP in the last 168 hours. Lipid Profile: No results for input(s): CHOL, HDL, LDLCALC, TRIG, CHOLHDL, LDLDIRECT in the last 72 hours. Thyroid Function Tests: No results for input(s): TSH, T4TOTAL, FREET4, T3FREE, THYROIDAB in the last 72 hours. Anemia  Panel: No results for input(s): VITAMINB12, FOLATE, FERRITIN, TIBC, IRON, RETICCTPCT in the last 72 hours. Urine analysis:    Component Value Date/Time   COLORURINE STRAW (A) 2018/02/02 1533   APPEARANCEUR CLEAR Feb 02, 2018 1533   LABSPEC 1.009 02-02-2018 1533   PHURINE 7.0 2018/02/02 1533   GLUCOSEU NEGATIVE 02/02/2018 1533   HGBUR NEGATIVE 02/02/2018 1533   BILIRUBINUR NEGATIVE 2018-02-02 1533   KETONESUR NEGATIVE 02-Feb-2018 1533   PROTEINUR 30 (A) 02-Feb-2018 1533   NITRITE NEGATIVE 2018/02/02 1533   LEUKOCYTESUR NEGATIVE 02-02-2018 1533   Sepsis Labs: @LABRCNTIP (procalcitonin:4,lacticidven:4) )No results found for this or any previous visit (from the past 240 hour(s)).   Radiological Exams on Admission: Ct Head Wo Contrast  Result Date: 02-02-18 CLINICAL DATA:  Syncopal episode with altered mental status and right-sided weakness. History of diabetes. EXAM: CT HEAD WITHOUT CONTRAST TECHNIQUE: Contiguous axial images were obtained from the base of the skull through the vertex without intravenous contrast. COMPARISON:  CT head 06/16/2017 and 03/03/2017. FINDINGS: Brain: There is no evidence of acute intracranial hemorrhage, mass lesion, brain edema or extra-axial fluid collection. There is diffuse prominence of the ventricles and subarachnoid spaces consistent with moderate atrophy. Chronic small vessel ischemic changes are present in the periventricular white matter bilaterally. There has been interval development of a large infarct in the right occipital lobe. There are probable additional interval small infarcts in the right frontal periventricular white matter and the left cerebellum. There is no CT evidence of acute cortical infarction. Vascular: Extensive intracranial vascular calcifications. No hyperdense vessel identified. Skull: Negative for fracture or focal lesion. Sinuses/Orbits: Postsurgical changes in the maxillary sinuses bilaterally with mild residual mucosal thickening. No  air-fluid levels are identified. The mastoid air cells, middle ears and orbital contents appear stable. Other: None. IMPRESSION: 1. Interval development of multifocal infarcts since 06/16/2017, involving the right occipital lobe, right frontal periventricular white matter and left cerebellar hemisphere. These all appear completed. 2. No CT evidence of acute infarct or hemorrhage. 3.  Atrophy and chronic small vessel ischemic changes. Electronically Signed   By: Carey Bullocks M.D.   On: 01/13/2018 15:19   Dg Chest Port 1 View  Result Date: 01/27/2018 CLINICAL DATA:  Altered mental status EXAM: PORTABLE CHEST 1 VIEW COMPARISON:  10/14/8 FINDINGS: Cardiac shadow is enlarged. Pacing device is again seen and stable. The lungs are clear. No bony abnormality is noted. IMPRESSION: No acute abnormality noted. Electronically Signed   By: Alcide Clever M.D.   On: 01/29/2018 14:19    EKG: Independently reviewed.  Paced rhythm.  Assessment/Plan Principal Problem:   TIA (transient ischemic attack) Active Problems:   Sick sinus syndrome (HCC)   Hypertensive urgency   HLD (hyperlipidemia)   Dementia (HCC)    1. TIA -appreciate neurology consult.  Unable to do MRI due to pacemaker.  Will check 2D echo carotid Doppler neurochecks.  Patient passed swallow.  On aspirin.  Patient already on statins.  Check lipid panel and hemoglobin A1c. 2. Hypertensive urgency -patient was given her home dose of ramipril.  We will also place patient on PRN IV hydralazine with permissive hypertension.  Closely follow blood pressure trends.  Continue Coreg. 3. Hyperlipidemia on statins. 4. Dementia with depression on Risperdal Cymbalta. 5. Chronic kidney disease stage III -follow metabolic panel. 6. History of diabetes mellitus type 2 as per the chart.  Not on any medication for hemoglobin A1c.   DVT prophylaxis: Lovenox. Code Status: DNR. Family Communication: No family at the bedside. Disposition Plan: Back to skilled  nursing facility. Consults called: Neurology. Admission status: Observation.   Eduard Clos MD [facility] Pager [phone].  If 7PM-7AM, please contact night-coverage [email or web address] Password [email or web address]  01/11/2018, 10:00 PM

## 2018-02-01 NOTE — ED Notes (Signed)
Updated MD pt continued BP 200s. No new orders

## 2018-02-01 NOTE — ED Triage Notes (Signed)
Per EMS pt had syncopal episode in wheel chair followed by AMS. At baseline pt is A&O X2 upon EMS arrival pt was disoriented x4 and had right side weakness. Pt hypertensive as well BP 250/118

## 2018-02-01 NOTE — Consult Note (Signed)
Referring Physician: Dr. Hal Hope    Chief Complaint: Intermittent right sided weakness with AMS  HPI: Monica Choi is an 84 y.o. female who presents with severe HTN and new right sided weakness which is intermittent. Initial reason for presentation to the ED was a syncopal episode while in her wheelchair followed by AMS. Upon EMS arrival the patient was disoriented x 4 and had right sided weakness. She was hypertensive as well with BP of 250/118. Per ED Triage note, at baseline the patient is A&OX2. ED physician noted right sided weakness on exam, which then resolved. The patient has a history of TIA in 2018. She also has sick sinus syndrome and a cardiac pacemaker.   Stroke risk factors: Prior TIA, DM, HTN.  Past Medical History:  Diagnosis Date  . Anemia   . Anxiety   . Arrhythmia   . Diabetes mellitus without complication ([place])   . Diverticulitis   . GERD (gastroesophageal reflux disease)   . Hypertension   . Presence of permanent cardiac pacemaker    hx/notes 11/25/2016  . Rectal prolapse   . Renal disorder   . Rhabdomyolysis 11/2016  . Sick sinus syndrome ([place])    hx/notes 11/25/2016  . Sinus node dysfunction ([facility])   . TIA (transient ischemic attack) 07/2016   hx/notes 11/25/2016    Past Surgical History:  Procedure Laterality Date  . ABDOMINAL HYSTERECTOMY    . ABDOMINAL SURGERY    . CARDIAC SURGERY    . CATARACT EXTRACTION Bilateral   . CHOLECYSTECTOMY    . COLON SURGERY    . HERNIA REPAIR    . INSERT / REPLACE / REMOVE PACEMAKER     hx/notes 11/25/2016  . KNEE SURGERY    . NASAL SINUS SURGERY      Family History  Problem Relation Age of Onset  . Heart attack Father   . Heart attack Brother    Social History:  reports that she has quit smoking. She has never used smokeless tobacco. She reports that she does not drink alcohol or use drugs.  Allergies:  Allergies  Allergen Reactions  . Morphine Nausea And Vomiting  . Sulfa Antibiotics Anaphylaxis and Rash    . Amoxicillin Other (See Comments)  . Benzonatate Other (See Comments)  . Buprenorphine Nausea And Vomiting  . Fish-Derived Products Nausea Only  . Penicillins Hives    Has patient had a PCN reaction causing immediate rash, facial/tongue/throat swelling, SOB or lightheadedness with hypotension: Yes Has patient had a PCN reaction causing severe rash involving mucus membranes or skin necrosis: Unknown Has patient had a PCN reaction that required hospitalization: Unknown Has patient had a PCN reaction occurring within the last 10 years: No If all of the above answers are "NO", then may proceed with Cephalosporin use.   . Ciprofloxacin Itching and Rash  . Morphine And Related Nausea And Vomiting    Home Medications:    ROS: Unable to obtain due to cognitive/communication deficit.   Physical Examination: Blood pressure (!) 179/87, pulse 88, temperature 97.9 F (36.6 C), temperature source Oral, resp. rate 14, height _0  (1.626 m), weight 63.5 kg, SpO2 98 %.  HEENT: [place]/AT Lungs: Respirations unlabored Ext: No edema  Neurologic Examination: Mental Status: Alert and awake. Unable to correctly answer any orientation questions regarding place, time or circumstance. Attends to examiner and tracks normally. Smiles often. Speech output is sparse with short 3-4 word answers to questions. Some perseveration noted. No agitation. Attempts to be cooperative with exam. Has difficulty  following commands. Cranial Nerves: II:  Blinks to threat on the right consistently OU. Inconsistent blink to threat on the left OU. PERRL.  III,IV, VI: No ptosis. Horizontal EOMI intact. No nystagmus.  V,VII: Smile symmetric with no facial droop noted. Does not follow instructions when asked to state if she feels cool object on either side of face. VIII: hearing intact to some commands IX,X: Unable to visualize palate.  XI: No forced deviation of head.  XII: Midline tongue extension  Motor: RUE drifts somewhat  more quickly to the bed than LUE when passively raised by examiner on 3 trials.  RUE max grip strength, triceps and biceps is 4/5. Poor and inconsistent effort.  LUE max grip strength, triceps and biceps is 4+/5.  Withdraws LLE more briskly than right with noxious plantar stimulation. Does not follow commands to elevate legs or extend at knees.  Sensory: Unreliable responses to sensory exam. Withdraws to plantar stimulation as above.  Deep Tendon Reflexes:  Right biceps and brachioradialis 3+ Left biceps and brachioradialis 3+ Bilateral patellae 3+ Achilles 0 bilaterally.  Plantars: Equivocal bilaterally.  Cerebellar: Unable to follow commands for testing.  Gait: Deferred due to falls risk concerns  Results for orders placed or performed during the hospital encounter of 01/31/2018 (from the past 48 hour(s))  Urine rapid drug screen (hosp performed)     Status: None   Collection Time: 01/29/2018  3:33 PM  Result Value Ref Range   Opiates NONE DETECTED NONE DETECTED   Cocaine NONE DETECTED NONE DETECTED   Benzodiazepines NONE DETECTED NONE DETECTED   Amphetamines NONE DETECTED NONE DETECTED   Tetrahydrocannabinol NONE DETECTED NONE DETECTED   Barbiturates NONE DETECTED NONE DETECTED    Comment: (NOTE) DRUG SCREEN FOR MEDICAL PURPOSES ONLY.  IF CONFIRMATION IS NEEDED FOR ANY PURPOSE, NOTIFY LAB WITHIN 5 DAYS. LOWEST DETECTABLE LIMITS FOR URINE DRUG SCREEN Drug Class                     Cutoff (ng/mL) Amphetamine and metabolites    1000 Barbiturate and metabolites    200 Benzodiazepine                 277 Tricyclics and metabolites     300 Opiates and metabolites        300 Cocaine and metabolites        300 THC                            50 Performed at [facility], [place] [address], [place], [place] [address]   Urinalysis, Routine w reflex microscopic     Status: Abnormal   Collection Time: 01/23/2018  3:33 PM  Result Value Ref Range   Color, Urine STRAW (A) YELLOW    APPearance CLEAR CLEAR   Specific Gravity, Urine 1.009 1.005 - 1.030   pH 7.0 5.0 - 8.0   Glucose, UA NEGATIVE NEGATIVE mg/dL   Hgb urine dipstick NEGATIVE NEGATIVE   Bilirubin Urine NEGATIVE NEGATIVE   Ketones, ur NEGATIVE NEGATIVE mg/dL   Protein, ur 30 (A) NEGATIVE mg/dL   Nitrite NEGATIVE NEGATIVE   Leukocytes, UA NEGATIVE NEGATIVE   RBC / HPF 0-5 0 - 5 RBC/hpf   WBC, UA 0-5 0 - 5 WBC/hpf   Bacteria, UA NONE SEEN NONE SEEN   Hyaline Casts, UA PRESENT     Comment: Performed at [facility] [address] [place], [place]  [address]  Ethanol     Status: None   Collection Time: 01/08/2018  4:13 PM  Result Value Ref Range   Alcohol, Ethyl (B) <10 <10 mg/dL    Comment: (NOTE) Lowest detectable limit for serum alcohol is 10 mg/dL. For medical purposes only. Performed at [facility], [place] [address], [place], [place] [address]   Protime-INR     Status: None   Collection Time: 01/16/2018  4:13 PM  Result Value Ref Range   Prothrombin Time 12.4 11.4 - 15.2 seconds   INR 0.93     Comment: Performed at [facility] [address] [place], [place] [address]  APTT     Status: None   Collection Time: 01/05/2018  4:13 PM  Result Value Ref Range   aPTT 24 24 - 36 seconds    Comment: Performed at [facility] [address] [place] [address]  CBC     Status: None   Collection Time: 01/21/2018  4:13 PM  Result Value Ref Range   WBC 9.1 4.0 - 10.5 K/uL   RBC 4.55 3.87 - 5.11 MIL/uL   Hemoglobin 13.9 12.0 - 15.0 g/dL   HCT 44.0 36.0 - 46.0 %   MCV 96.7 80.0 - 100.0 fL   MCH 30.5 26.0 - 34.0 pg   MCHC 31.6 30.0 - 36.0 g/dL   RDW 11.9 11.5 - 15.5 %   Platelets 273 150 - 400 K/uL   nRBC 0.0 0.0 - 0.2 %    Comment: Performed at [facility], [place] [address], [place], [place] [address]  Differential     Status: None   Collection Time: 01/26/2018  4:13 PM  Result Value Ref Range   Neutrophils Relative % 68 %   Neutro Abs 6.2 1.7 - 7.7 K/uL    Lymphocytes Relative 24 %   Lymphs Abs 2.2 0.7 - 4.0 K/uL   Monocytes Relative 5 %   Monocytes Absolute 0.5 0.1 - 1.0 K/uL   Eosinophils Relative 2 %   Eosinophils Absolute 0.2 0.0 - 0.5 K/uL   Basophils Relative 1 %   Basophils Absolute 0.1 0.0 - 0.1 K/uL   Immature Granulocytes 0 %   Abs Immature Granulocytes 0.04 0.00 - 0.07 K/uL    Comment: Performed at [facility] [address] [place], [place] [address]  Comprehensive metabolic panel     Status: Abnormal   Collection Time: 01/12/2018  4:13 PM  Result Value Ref Range   Sodium 138 135 - 145 mmol/L   Potassium 4.8 3.5 - 5.1 mmol/L    Comment: HEMOLYSIS AT THIS LEVEL MAY AFFECT RESULT   Chloride 105 98 - 111 mmol/L   CO2 22 22 - 32 mmol/L   Glucose, Bld 130 (H) 70 - 99 mg/dL   BUN 26 (H) 8 - 23 mg/dL   Creatinine, Ser 1.28 (H) 0.44 - 1.00 mg/dL   Calcium 9.9 8.9 - 10.3 mg/dL   Total Protein 7.0 6.5 - 8.1 g/dL   Albumin 3.9 3.5 - 5.0 g/dL   AST 27 15 - 41 U/L   ALT 17 0 - 44 U/L   Alkaline Phosphatase 94 38 - 126 U/L   Total Bilirubin 0.9 0.3 - 1.2 mg/dL   GFR calc non Af Amer 38 (L) >60 mL/min   GFR calc Af Amer 44 (L) >60 mL/min    Comment: (NOTE) The eGFR has been calculated using the CKD EPI equation. This  calculation has not been validated in all clinical situations. eGFR's persistently <60 mL/min signify possible Chronic Kidney Disease.    Anion gap 11 5 - 15    Comment: Performed at [facility] [address] [place], [place] [address]  I-stat troponin, ED     Status: None   Collection Time: 02/02/2018  4:46 PM  Result Value Ref Range   Troponin i, poc 0.01 0.00 - 0.08 ng/mL   Comment 3            Comment: Due to the release kinetics of cTnI, a negative result within the first hours of the onset of symptoms does not rule out myocardial infarction with certainty. If myocardial infarction is still suspected, repeat the test at appropriate intervals.   I-Stat Chem 8, ED     Status: Abnormal    Collection Time: 01/16/2018  4:49 PM  Result Value Ref Range   Sodium 140 135 - 145 mmol/L   Potassium 3.9 3.5 - 5.1 mmol/L   Chloride 107 98 - 111 mmol/L   BUN 28 (H) 8 - 23 mg/dL   Creatinine, Ser 1.30 (H) 0.44 - 1.00 mg/dL   Glucose, Bld 134 (H) 70 - 99 mg/dL   Calcium, Ion 1.19 1.15 - 1.40 mmol/L   TCO2 26 22 - 32 mmol/L   Hemoglobin 14.3 12.0 - 15.0 g/dL   HCT 42.0 36.0 - 46.0 %   Ct Head Wo Contrast  Result Date: 01/17/2018 CLINICAL DATA:  Syncopal episode with altered mental status and right-sided weakness. History of diabetes. EXAM: CT HEAD WITHOUT CONTRAST TECHNIQUE: Contiguous axial images were obtained from the base of the skull through the vertex without intravenous contrast. COMPARISON:  CT head 06/16/2017 and 03/03/2017. FINDINGS: Brain: There is no evidence of acute intracranial hemorrhage, mass lesion, brain edema or extra-axial fluid collection. There is diffuse prominence of the ventricles and subarachnoid spaces consistent with moderate atrophy. Chronic small vessel ischemic changes are present in the periventricular white matter bilaterally. There has been interval development of a large infarct in the right occipital lobe. There are probable additional interval small infarcts in the right frontal periventricular white matter and the left cerebellum. There is no CT evidence of acute cortical infarction. Vascular: Extensive intracranial vascular calcifications. No hyperdense vessel identified. Skull: Negative for fracture or focal lesion. Sinuses/Orbits: Postsurgical changes in the maxillary sinuses bilaterally with mild residual mucosal thickening. No air-fluid levels are identified. The mastoid air cells, middle ears and orbital contents appear stable. Other: None. IMPRESSION: 1. Interval development of multifocal infarcts since 06/16/2017, involving the right occipital lobe, right frontal periventricular white matter and left cerebellar hemisphere. These all appear completed. 2.  No CT evidence of acute infarct or hemorrhage. 3. Atrophy and chronic small vessel ischemic changes. Electronically Signed   By: Richardean Sale M.D.   On: 01/29/2018 15:19   Dg Chest Port 1 View  Result Date: 01/05/2018 CLINICAL DATA:  Altered mental status EXAM: PORTABLE CHEST 1 VIEW COMPARISON:  10/14/8 FINDINGS: Cardiac shadow is enlarged. Pacing device is again seen and stable. The lungs are clear. No bony abnormality is noted. IMPRESSION: No acute abnormality noted. Electronically Signed   By: Inez Catalina M.D.   On: 01/29/2018 14:19    Assessment: 84 y.o. female presenting with syncopal spell followed by worsened cognition relative to baseline. Right sided weakness noted by EMS. No jerking, twitching or other seizure like motor activity documented.  1. Based on exam with hyperreflexia, her right sided  weakness relative to left ismore likely to be chronic rather than acute. No clear time of onset of weakness based on documentation. CT also reveals multiple new completed strokes relative to prior CT from March 2019.  2. Not a clear tPA candidate at time of initial ED arrival based on the above. Not an endovascular candidate due to dementia with mRS > 2.  3. CT head: Interval development of multifocal infarcts since 06/16/2017, involving the right occipital lobe, right frontal periventricular white matter and left cerebellar hemisphere. These all appear completed. No CT evidence of acute infarct or hemorrhage. Atrophy and chronic small vessel ischemic changes are also noted.  4. Right occipital lobe infarct on CT explains neurological exam finding of inconsistent blink to threat on the left.  5. Stroke Risk Factors - Prior TIA, DM, HTN. 6. Volume depletion likely based on elevated BUN/Cr and presentation with syncope.   Plan: 1. HgbA1c, fasting lipid panel 2. Unable to obtain MRI brain due to pacemaker.  3. PT consult, OT consult, Speech consult 4. Echocardiogram 5. Carotid dopplers 6.  Continue ASA. Stroke team to determine possible indications regarding alternate medications for secondary prophylaxis after stroke work up is completed.  7. On Zocor. Not an optimal candidate for high dose statin therapy based on advanced age.  8. Risk factor modification 9. Telemetry monitoring 10. Frequent neuro checks 11. BP management. Given unclear time of onset, would keep SBP < 220 and correct by 15% per day to a goal of 120-140.  12. Volume repletion with monitoring of electrolytes 13. After stroke work up is completed, consider a trial of Aricept 14. Consider discontinuation of Ultram.    _0  signed: Dr. Kerney Elbe 01/22/2018, 8:51 PM

## 2018-02-01 NOTE — ED Provider Notes (Signed)
[facility] EMERGENCY DEPARTMENT Provider Note   CSN: [id number] Arrival date & time: 01/11/2018  1306     History   Chief Complaint Chief Complaint  Patient presents with  . Loss of Consciousness    HPI Monica Choi is a 84 y.o. female.  Patient brought in by EMS.  Patient brought in from nursing facility.  Patient supposedly had syncopal episode.  But in route it was noted that she was a phasic and seemed to have weakness on the right side.  Code stroke was not called prior to arrival.  Patient first arrived.  She definitely was a phasic and had significant right upper extremity weakness and had some left lower extremity weakness.  Probably within about 15 minutes from arrival this all resolved and patient also started talking.  I when asked her which name and she is on Monica Choi.  Having pain anything said no and all the focal stuff was gone.     Past Medical History:  Diagnosis Date  . Anemia   . Anxiety   . Arrhythmia   . Diabetes mellitus without complication (HCC)   . Diverticulitis   . GERD (gastroesophageal reflux disease)   . Hypertension   . Presence of permanent cardiac pacemaker    hx/notes 11/25/2016  . Rectal prolapse   . Renal disorder   . Rhabdomyolysis 11/2016  . Sick sinus syndrome (HCC)    hx/notes 11/25/2016  . Sinus node dysfunction (HCC)   . TIA (transient ischemic attack) 07/2016   hx/notes 11/25/2016    Patient Active Problem List   Diagnosis Date Noted  . Encephalopathy 03/02/2017  . Rhabdomyolysis 11/25/2016  . Nonintractable headache   . TIA (transient ischemic attack) 09/17/2016  . Hypertension 07/17/2016  . Sick sinus syndrome (HCC) 07/17/2016    Past Surgical History:  Procedure Laterality Date  . ABDOMINAL HYSTERECTOMY    . ABDOMINAL SURGERY    . CARDIAC SURGERY    . CATARACT EXTRACTION Bilateral   . CHOLECYSTECTOMY    . COLON SURGERY    . HERNIA REPAIR    . INSERT / REPLACE / REMOVE PACEMAKER     hx/notes 11/25/2016  . KNEE SURGERY    . NASAL SINUS SURGERY       OB History   None      Home Medications    Prior to Admission medications   Medication Sig Start Date End Date Taking? Authorizing Provider  aspirin EC 81 MG EC tablet Take 1 tablet (81 mg total) by mouth daily. 09/19/16  Yes Tillman Sers, DO  Cholecalciferol (VITAMIN D) 2000 units CAPS Take 2,000 Units by mouth daily.    Yes [provider]  dicyclomine (BENTYL) 10 MG capsule Take 10 mg by mouth every 8 (eight) hours as needed for spasms.   Yes [provider]  DULoxetine (CYMBALTA) 30 MG capsule Take 30 mg by mouth 2 (two) times daily.   Yes [provider]  meloxicam (MOBIC) 7.5 MG tablet Take 7.5 mg by mouth daily as needed for pain.   Yes [provider]  pantoprazole (PROTONIX) 20 MG tablet Take 20 mg by mouth daily. 04/08/16  Yes [provider]  ramipril (ALTACE) 10 MG capsule Take 10 mg by mouth daily.   Yes [provider]  risperiDONE (RISPERDAL) 0.25 MG tablet Take 0.25 mg by mouth at bedtime.    Yes [provider]  simvastatin (ZOCOR) 10 MG tablet Take 10 mg by mouth at bedtime.  Yes [provider]  vitamin B-12 (CYANOCOBALAMIN) 1000 MCG tablet Take 1,000 mcg by mouth daily.   Yes [provider]  carvedilol (COREG) 12.5 MG tablet Take 1 tablet (12.5 mg total) by mouth 2 (two) times daily. Patient not taking: Reported on February 17, 2018 07/17/16   Regan Lemming, MD  metoCLOPramide (REGLAN) 10 MG tablet Take 1 tablet (10 mg total) by mouth every 6 (six) hours as needed (for nausea/headache). Patient not taking: Reported on 06/16/2017 09/16/16   Molpus, John, MD  ramipril (ALTACE) 5 MG capsule Take 1 capsule (5 mg total) by mouth daily. Patient not taking: Reported on 17-Feb-2018 01/19/17   Sharyn Creamer, MD  traMADol (ULTRAM) 50 MG tablet Take 1 tablet (50 mg total) by mouth every 12 (twelve) hours as needed for severe  pain. Patient not taking: Reported on 01/18/2017 11/28/16   Marthenia Rolling, DO    Family History Family History  Problem Relation Age of Onset  . Heart attack Father   . Heart attack Brother     Social History Social History   Tobacco Use  . Smoking status: Former Games developer  . Smokeless tobacco: Never Used  Substance Use Topics  . Alcohol use: No  . Drug use: No     Allergies   Morphine; Sulfa antibiotics; Amoxicillin; Benzonatate; Buprenorphine; Fish-derived products; Penicillins; Ciprofloxacin; and Morphine and related   Review of Systems Review of Systems  Unable to perform ROS: Dementia     Physical Exam Updated Vital Signs BP (!) 235/142   Pulse 92   Temp 97.9 F (36.6 C) (Oral)   Resp 14   Ht 1.626 m (5\' 4" )   Wt 63.5 kg   SpO2 98%   BMI 24.03 kg/m   Physical Exam  Constitutional: She appears well-developed and well-nourished. No distress.  HENT:  Head: Normocephalic and atraumatic.  Mouth/Throat: Oropharynx is clear and moist.  Eyes: Pupils are equal, round, and reactive to light. Conjunctivae and EOM are normal.  Neck: Normal range of motion. Neck supple.  Cardiovascular: Normal rate, regular rhythm and normal heart sounds.  Pulmonary/Chest: Effort normal and breath sounds normal. No respiratory distress.  Abdominal: Bowel sounds are normal. There is no tenderness.  Musculoskeletal: Normal range of motion. She exhibits no edema.  Neurological: She is alert. No cranial nerve deficit or sensory deficit. She exhibits normal muscle tone.  Skin: Skin is warm.  Nursing note and vitals reviewed.    ED Treatments / Results  Labs (all labs ordered are listed, but only abnormal results are displayed) Labs Reviewed  COMPREHENSIVE METABOLIC PANEL - Abnormal; Notable for the following components:      Result Value   Glucose, Bld 130 (*)    BUN 26 (*)    Creatinine, Ser 1.28 (*)    GFR calc non Af Amer 38 (*)    GFR calc Af Amer 44 (*)    All other  components within normal limits  URINALYSIS, ROUTINE W REFLEX MICROSCOPIC - Abnormal; Notable for the following components:   Color, Urine STRAW (*)    Protein, ur 30 (*)    All other components within normal limits  I-STAT CHEM 8, ED - Abnormal; Notable for the following components:   BUN 28 (*)    Creatinine, Ser 1.30 (*)    Glucose, Bld 134 (*)    All other components within normal limits  ETHANOL  PROTIME-INR  APTT  CBC  DIFFERENTIAL  RAPID URINE DRUG SCREEN, HOSP PERFORMED  I-STAT TROPONIN, ED  EKG EKG Interpretation  Date/Time:  Monday February 01 2018 13:41:45 EDT Ventricular Rate:  78 PR Interval:    QRS Duration: 111 QT Interval:  364 QTC Calculation: 415 R Axis:   -57 Text Interpretation:  atrial-paced complexes Abnormal R-wave progression, early transition LVH with IVCD, LAD and secondary repol abnrm Confirmed by Vanetta Mulders [phone]) on 01/23/2018 1:50:21 PM   Radiology Ct Head Wo Contrast  Result Date: 01/14/2018 CLINICAL DATA:  Syncopal episode with altered mental status and right-sided weakness. History of diabetes. EXAM: CT HEAD WITHOUT CONTRAST TECHNIQUE: Contiguous axial images were obtained from the base of the skull through the vertex without intravenous contrast. COMPARISON:  CT head 06/16/2017 and 03/03/2017. FINDINGS: Brain: There is no evidence of acute intracranial hemorrhage, mass lesion, brain edema or extra-axial fluid collection. There is diffuse prominence of the ventricles and subarachnoid spaces consistent with moderate atrophy. Chronic small vessel ischemic changes are present in the periventricular white matter bilaterally. There has been interval development of a large infarct in the right occipital lobe. There are probable additional interval small infarcts in the right frontal periventricular white matter and the left cerebellum. There is no CT evidence of acute cortical infarction. Vascular: Extensive intracranial vascular calcifications.  No hyperdense vessel identified. Skull: Negative for fracture or focal lesion. Sinuses/Orbits: Postsurgical changes in the maxillary sinuses bilaterally with mild residual mucosal thickening. No air-fluid levels are identified. The mastoid air cells, middle ears and orbital contents appear stable. Other: None. IMPRESSION: 1. Interval development of multifocal infarcts since 06/16/2017, involving the right occipital lobe, right frontal periventricular white matter and left cerebellar hemisphere. These all appear completed. 2. No CT evidence of acute infarct or hemorrhage. 3. Atrophy and chronic small vessel ischemic changes. Electronically Signed   By: Carey Bullocks M.D.   On: 01/12/2018 15:19   Dg Chest Port 1 View  Result Date: 01/22/2018 CLINICAL DATA:  Altered mental status EXAM: PORTABLE CHEST 1 VIEW COMPARISON:  10/14/8 FINDINGS: Cardiac shadow is enlarged. Pacing device is again seen and stable. The lungs are clear. No bony abnormality is noted. IMPRESSION: No acute abnormality noted. Electronically Signed   By: Alcide Clever M.D.   On: 01/30/2018 14:19    Procedures Procedures (including critical care time)  Medications Ordered in ED Medications - No data to display   Initial Impression / Assessment and Plan / ED Course  I have reviewed the triage vital signs and the nursing notes.  Pertinent labs & imaging results that were available during my care of the patient were reviewed by me and considered in my medical decision making (see chart for details).    Discussed with Dr. Amada Jupiter.  For the concern for possible TIA.  Head CT shows multiple prior infarcts.  Dr. Petra Kuba from neurology recommended that she get an MRI but we can get that done because patient has a pacemaker.  Patient will be admitted by the hospitalist team.  Neurology will consult.  Later family member arrived and stated that patient has had similar events in the past she recently moved from a nursing facility in  [place] to the facility here.  These events have occurred before.  Neurology still wants the admission.  Code stroke was never initiated because patient symptoms all resolved.  Work-up otherwise without any significant abnormalities.  Patient remains sniffily hypertensive here.  Patient was given a dose of her hypertensive meds.   Final Clinical Impressions(s) / ED Diagnoses   Final diagnoses:  TIA (transient ischemic attack)  ED Discharge Orders    None       Vanetta Mulders, MD 02/06/2018 2211

## 2018-02-01 NOTE — ED Notes (Signed)
MD updated Pt sbp 200s. No new orders

## 2018-02-02 ENCOUNTER — Observation Stay (HOSPITAL_COMMUNITY): Payer: Self-pay

## 2018-02-02 ENCOUNTER — Observation Stay (HOSPITAL_COMMUNITY): Payer: PPO

## 2018-02-02 ENCOUNTER — Encounter (HOSPITAL_COMMUNITY): Payer: Self-pay | Admitting: General Practice

## 2018-02-02 DIAGNOSIS — F0280 Dementia in other diseases classified elsewhere without behavioral disturbance: Secondary | ICD-10-CM | POA: Diagnosis present

## 2018-02-02 DIAGNOSIS — R1310 Dysphagia, unspecified: Secondary | ICD-10-CM | POA: Diagnosis present

## 2018-02-02 DIAGNOSIS — F0150 Vascular dementia without behavioral disturbance: Secondary | ICD-10-CM | POA: Diagnosis not present

## 2018-02-02 DIAGNOSIS — E870 Hyperosmolality and hypernatremia: Secondary | ICD-10-CM | POA: Diagnosis present

## 2018-02-02 DIAGNOSIS — I160 Hypertensive urgency: Secondary | ICD-10-CM | POA: Diagnosis present

## 2018-02-02 DIAGNOSIS — J9601 Acute respiratory failure with hypoxia: Secondary | ICD-10-CM | POA: Diagnosis present

## 2018-02-02 DIAGNOSIS — N183 Chronic kidney disease, stage 3 (moderate): Secondary | ICD-10-CM | POA: Diagnosis present

## 2018-02-02 DIAGNOSIS — I6340 Cerebral infarction due to embolism of unspecified cerebral artery: Secondary | ICD-10-CM | POA: Diagnosis not present

## 2018-02-02 DIAGNOSIS — I351 Nonrheumatic aortic (valve) insufficiency: Secondary | ICD-10-CM | POA: Diagnosis not present

## 2018-02-02 DIAGNOSIS — R55 Syncope and collapse: Secondary | ICD-10-CM | POA: Diagnosis present

## 2018-02-02 DIAGNOSIS — R0902 Hypoxemia: Secondary | ICD-10-CM | POA: Diagnosis not present

## 2018-02-02 DIAGNOSIS — Z515 Encounter for palliative care: Secondary | ICD-10-CM | POA: Diagnosis not present

## 2018-02-02 DIAGNOSIS — I6523 Occlusion and stenosis of bilateral carotid arteries: Secondary | ICD-10-CM | POA: Diagnosis present

## 2018-02-02 DIAGNOSIS — G459 Transient cerebral ischemic attack, unspecified: Secondary | ICD-10-CM

## 2018-02-02 DIAGNOSIS — E785 Hyperlipidemia, unspecified: Secondary | ICD-10-CM | POA: Diagnosis present

## 2018-02-02 DIAGNOSIS — G8191 Hemiplegia, unspecified affecting right dominant side: Secondary | ICD-10-CM | POA: Diagnosis present

## 2018-02-02 DIAGNOSIS — I129 Hypertensive chronic kidney disease with stage 1 through stage 4 chronic kidney disease, or unspecified chronic kidney disease: Secondary | ICD-10-CM | POA: Diagnosis present

## 2018-02-02 DIAGNOSIS — F329 Major depressive disorder, single episode, unspecified: Secondary | ICD-10-CM | POA: Diagnosis present

## 2018-02-02 DIAGNOSIS — R29708 NIHSS score 8: Secondary | ICD-10-CM | POA: Diagnosis present

## 2018-02-02 DIAGNOSIS — D631 Anemia in chronic kidney disease: Secondary | ICD-10-CM | POA: Diagnosis present

## 2018-02-02 DIAGNOSIS — N179 Acute kidney failure, unspecified: Secondary | ICD-10-CM | POA: Diagnosis present

## 2018-02-02 DIAGNOSIS — L8990 Pressure ulcer of unspecified site, unspecified stage: Secondary | ICD-10-CM

## 2018-02-02 DIAGNOSIS — I639 Cerebral infarction, unspecified: Secondary | ICD-10-CM | POA: Diagnosis not present

## 2018-02-02 DIAGNOSIS — G9341 Metabolic encephalopathy: Secondary | ICD-10-CM | POA: Diagnosis present

## 2018-02-02 DIAGNOSIS — E1122 Type 2 diabetes mellitus with diabetic chronic kidney disease: Secondary | ICD-10-CM | POA: Diagnosis present

## 2018-02-02 DIAGNOSIS — I672 Cerebral atherosclerosis: Secondary | ICD-10-CM | POA: Diagnosis present

## 2018-02-02 DIAGNOSIS — I63422 Cerebral infarction due to embolism of left anterior cerebral artery: Secondary | ICD-10-CM | POA: Diagnosis present

## 2018-02-02 DIAGNOSIS — E1151 Type 2 diabetes mellitus with diabetic peripheral angiopathy without gangrene: Secondary | ICD-10-CM | POA: Diagnosis present

## 2018-02-02 DIAGNOSIS — F419 Anxiety disorder, unspecified: Secondary | ICD-10-CM | POA: Diagnosis present

## 2018-02-02 DIAGNOSIS — R569 Unspecified convulsions: Secondary | ICD-10-CM | POA: Diagnosis present

## 2018-02-02 DIAGNOSIS — L89319 Pressure ulcer of right buttock, unspecified stage: Secondary | ICD-10-CM | POA: Diagnosis present

## 2018-02-02 DIAGNOSIS — R4701 Aphasia: Secondary | ICD-10-CM | POA: Diagnosis present

## 2018-02-02 DIAGNOSIS — I495 Sick sinus syndrome: Secondary | ICD-10-CM | POA: Diagnosis present

## 2018-02-02 LAB — COMPREHENSIVE METABOLIC PANEL
ALBUMIN: 3.4 g/dL — AB (ref 3.5–5.0)
ALK PHOS: 81 U/L (ref 38–126)
ALT: 15 U/L (ref 0–44)
AST: 16 U/L (ref 15–41)
Anion gap: 10 (ref 5–15)
BILIRUBIN TOTAL: 0.8 mg/dL (ref 0.3–1.2)
BUN: 25 mg/dL — ABNORMAL HIGH (ref 8–23)
CALCIUM: 9.5 mg/dL (ref 8.9–10.3)
CO2: 20 mmol/L — ABNORMAL LOW (ref 22–32)
CREATININE: 1.18 mg/dL — AB (ref 0.44–1.00)
Chloride: 109 mmol/L (ref 98–111)
GFR calc Af Amer: 49 mL/min — ABNORMAL LOW (ref >60)
GFR calc non Af Amer: 42 mL/min — ABNORMAL LOW (ref >60)
GLUCOSE: 193 mg/dL — AB (ref 70–99)
Potassium: 4.1 mmol/L (ref 3.5–5.1)
Sodium: 139 mmol/L (ref 135–145)
TOTAL PROTEIN: 6.2 g/dL — AB (ref 6.5–8.1)

## 2018-02-02 LAB — CBC
HCT: 41.9 % (ref 36.0–46.0)
Hemoglobin: 13 g/dL (ref 12.0–15.0)
MCH: 29.5 pg (ref 26.0–34.0)
MCHC: 31 g/dL (ref 30.0–36.0)
MCV: 95.2 fL (ref 80.0–100.0)
PLATELETS: 277 10*3/uL (ref 150–400)
RBC: 4.4 MIL/uL (ref 3.87–5.11)
RDW: 11.9 % (ref 11.5–15.5)
WBC: 9.7 10*3/uL (ref 4.0–10.5)
nRBC: 0 % (ref 0.0–0.2)

## 2018-02-02 LAB — LIPID PANEL
CHOL/HDL RATIO: 4.5 ratio
CHOLESTEROL: 208 mg/dL — AB (ref 0–200)
HDL: 46 mg/dL (ref >40)
LDL Cholesterol: 130 mg/dL — ABNORMAL HIGH (ref 0–99)
Triglycerides: 161 mg/dL — ABNORMAL HIGH (ref <150)
VLDL: 32 mg/dL (ref 0–40)

## 2018-02-02 LAB — ECHOCARDIOGRAM COMPLETE
Height: 64 in
WEIGHTICAEL: 2240 [oz_av]

## 2018-02-02 LAB — CBG MONITORING, ED: Glucose-Capillary: 159 mg/dL — ABNORMAL HIGH (ref 70–99)

## 2018-02-02 LAB — MRSA PCR SCREENING: MRSA BY PCR: NEGATIVE

## 2018-02-02 LAB — HEMOGLOBIN A1C
Hgb A1c MFr Bld: 6.9 % — ABNORMAL HIGH (ref 4.8–5.6)
MEAN PLASMA GLUCOSE: 151.33 mg/dL

## 2018-02-02 MED ORDER — IOPAMIDOL (ISOVUE-370) INJECTION 76%
INTRAVENOUS | Status: AC
  Administered 2018-02-02: 50 mL via INTRAVENOUS
  Filled 2018-02-02: qty 50

## 2018-02-02 MED ORDER — IOPAMIDOL (ISOVUE-370) INJECTION 76%: 100.0000 mL | Freq: Once | INTRAVENOUS | Status: DC

## 2018-02-02 MED ORDER — ENOXAPARIN SODIUM 30 MG/0.3ML ~~LOC~~ SOLN
30.0000 mg | Freq: Every day | SUBCUTANEOUS | Status: DC
  Administered 2018-02-02 – 2018-02-07 (×6): 30 mg via SUBCUTANEOUS
  Filled 2018-02-02 (×7): qty 0.3

## 2018-02-02 MED ORDER — FAMOTIDINE IN NACL 20-0.9 MG/50ML-% IV SOLN
20.0000 mg | INTRAVENOUS | Status: DC
  Administered 2018-02-02 – 2018-02-04 (×3): 20 mg via INTRAVENOUS
  Filled 2018-02-02 (×3): qty 50

## 2018-02-02 MED ORDER — METOPROLOL TARTRATE 5 MG/5ML IV SOLN
5.0000 mg | Freq: Four times a day (QID) | INTRAVENOUS | Status: DC
  Administered 2018-02-02 – 2018-02-05 (×11): 5 mg via INTRAVENOUS
  Filled 2018-02-02 (×11): qty 5

## 2018-02-02 MED ORDER — IOPAMIDOL (ISOVUE-370) INJECTION 76%
INTRAVENOUS | Status: AC
  Filled 2018-02-02: qty 50

## 2018-02-02 MED ORDER — SODIUM CHLORIDE 0.9 % IV BOLUS
500.0000 mL | Freq: Once | INTRAVENOUS | Status: AC
  Administered 2018-02-02: 500 mL via INTRAVENOUS

## 2018-02-02 MED ORDER — PERFLUTREN LIPID MICROSPHERE
1.0000 mL | INTRAVENOUS | Status: AC | PRN
  Administered 2018-02-02: 3 mL via INTRAVENOUS
  Filled 2018-02-02: qty 10

## 2018-02-02 NOTE — ED Notes (Signed)
IVT at bedside for US guided 18g for CT

## 2018-02-02 NOTE — Progress Notes (Addendum)
STROKE TEAM PROGRESS NOTE   INTERVAL HISTORY Her RN is at the bedside.  During assessment, RN noted patient had had significant change during the night without MD notification.  Patient previously low NIH which went up to 13 around 3 AM.  At 3:25 documented neuro change without code stroke activation.  Difficult to ascertain if all symptoms identified at 3 AM were indeed new based on presentation.  Regardless, at this time, patient is not in the TPA window and she is not an IR candidate due to high modified Rankin scale.  Will get CT angiogram of head and neck to further evaluate.  Vitals:   02/02/18 0414 02/02/18 0500 02/02/18 0800 02/02/18 0900  BP: (!) 197/91 (!) 165/96 (!) 173/110 (!) 180/95  Pulse: 77 81 84 73  Resp: 14 16 14 17   Temp:      TempSrc:      SpO2: 96% 96% 94% 97%  Weight:      Height:        CBC:  Recent Labs  Lab 11-Feb-2018 1613 2018/02/11 1649 02/02/18 0837  WBC 9.1  --  9.7  NEUTROABS 6.2  --   --   HGB 13.9 14.3 13.0  HCT 44.0 42.0 41.9  MCV 96.7  --  95.2  PLT 273  --  277    Basic Metabolic Panel:  Recent Labs  Lab 2018-02-11 1613 11-Feb-2018 1649 02/02/18 0837  NA 138 140 139  K 4.8 3.9 4.1  CL 105 107 109  CO2 22  --  20*  GLUCOSE 130* 134* 193*  BUN 26* 28* 25*  CREATININE 1.28* 1.30* 1.18*  CALCIUM 9.9  --  9.5   Lipid Panel:     Component Value Date/Time   CHOL 208 (H) 02/02/2018 0837   TRIG 161 (H) 02/02/2018 0837   HDL 46 02/02/2018 0837   CHOLHDL 4.5 02/02/2018 0837   VLDL 32 02/02/2018 0837   LDLCALC 130 (H) 02/02/2018 0837   HgbA1c:  Lab Results  Component Value Date   HGBA1C 6.9 (H) 02/02/2018   Urine Drug Screen:     Component Value Date/Time   LABOPIA NONE DETECTED 02-11-18 1533   COCAINSCRNUR NONE DETECTED 11-Feb-2018 1533   COCAINSCRNUR NONE DETECTED 03/02/2017 1043   LABBENZ NONE DETECTED 02/11/2018 1533   AMPHETMU NONE DETECTED Feb 11, 2018 1533   THCU NONE DETECTED 02/11/2018 1533   LABBARB NONE DETECTED 02-11-18  1533    Alcohol Level     Component Value Date/Time   ETH <10 11-Feb-2018 1613    IMAGING Ct Angio Head W Or Wo Contrast  Result Date: 02/02/2018 CLINICAL DATA:  Stroke EXAM: CT ANGIOGRAPHY HEAD AND NECK TECHNIQUE: Multidetector CT imaging of the head and neck was performed using the standard protocol during bolus administration of intravenous contrast. Multiplanar CT image reconstructions and MIPs were obtained to evaluate the vascular anatomy. Carotid stenosis measurements (when applicable) are obtained utilizing NASCET criteria, using the distal internal carotid diameter as the denominator. CONTRAST:  50 mL ISOVUE-370 IOPAMIDOL (ISOVUE-370) INJECTION 76% COMPARISON:  CT head 2018/02/11, CTA 09/18/2016 FINDINGS: CTA NECK FINDINGS Aortic arch: Atherosclerotic aortic arch without aneurysm. Aberrancy right subclavian artery with retro esophageal course. Atherosclerotic disease in the subclavian artery bilaterally without significant stenosis. Right carotid system: Right common carotid artery widely patent. Atherosclerotic calcification right carotid bifurcation. 25% diameter stenosis proximal right internal carotid artery. Left carotid system: Left common carotid artery widely patent. Atherosclerotic calcification left carotid bifurcation. 25% diameter stenosis left internal carotid artery Vertebral  arteries: Both vertebral arteries patent to the basilar without significant stenosis. Skeleton: Diffuse facet degeneration.  No acute skeletal lesion. Other neck: No soft tissue mass or edema. Upper chest: Negative Review of the MIP images confirms the above findings CTA HEAD FINDINGS Anterior circulation: Atherosclerotic calcification in the cavernous carotid bilaterally. Moderate stenosis supraclinoid internal carotid artery on the left. Mild stenosis right cavernous carotid. Extensive irregularity throughout the middle cerebral artery branches consistent with advanced atherosclerotic disease. Diffuse  atherosclerotic disease in the left anterior cerebral artery which is severely stenotic in the pericallosal region. Right anterior cerebral artery patent with mild disease. Posterior circulation: Both vertebral arteries patent to the basilar. PICA patent bilaterally. Basilar widely patent. Superior cerebellar and posterior cerebral arteries are patent bilaterally. Severe stenosis left P1 segment and mild stenosis left P2 segment. Right posterior cerebral artery widely patent. Venous sinuses: Normal enhancement Anatomic variants: None Delayed phase: No enhancing lesions identified. Multiple white matter infarcts of indeterminate age. Right occipital infarct could be chronic or subacute. Review of the MIP images confirms the above findings IMPRESSION: Atherosclerotic disease of the carotid bifurcation bilaterally without flow limiting stenosis. Both vertebral arteries widely patent to the basilar. Severe intracranial atherosclerotic disease as above. This has progressed markedly since 2018. No emergent large vessel occlusion. Electronically Signed   By: Marlan Palau M.D.   On: 02/02/2018 13:10   Ct Head Wo Contrast  Result Date: 01/05/2018 CLINICAL DATA:  Syncopal episode with altered mental status and right-sided weakness. History of diabetes. EXAM: CT HEAD WITHOUT CONTRAST TECHNIQUE: Contiguous axial images were obtained from the base of the skull through the vertex without intravenous contrast. COMPARISON:  CT head 06/16/2017 and 03/03/2017. FINDINGS: Brain: There is no evidence of acute intracranial hemorrhage, mass lesion, brain edema or extra-axial fluid collection. There is diffuse prominence of the ventricles and subarachnoid spaces consistent with moderate atrophy. Chronic small vessel ischemic changes are present in the periventricular white matter bilaterally. There has been interval development of a large infarct in the right occipital lobe. There are probable additional interval small infarcts in  the right frontal periventricular white matter and the left cerebellum. There is no CT evidence of acute cortical infarction. Vascular: Extensive intracranial vascular calcifications. No hyperdense vessel identified. Skull: Negative for fracture or focal lesion. Sinuses/Orbits: Postsurgical changes in the maxillary sinuses bilaterally with mild residual mucosal thickening. No air-fluid levels are identified. The mastoid air cells, middle ears and orbital contents appear stable. Other: None. IMPRESSION: 1. Interval development of multifocal infarcts since 06/16/2017, involving the right occipital lobe, right frontal periventricular white matter and left cerebellar hemisphere. These all appear completed. 2. No CT evidence of acute infarct or hemorrhage. 3. Atrophy and chronic small vessel ischemic changes. Electronically Signed   By: Carey Bullocks M.D.   On: 01/16/2018 15:19   Ct Angio Neck W Or Wo Contrast  Result Date: 02/02/2018 CLINICAL DATA:  Stroke EXAM: CT ANGIOGRAPHY HEAD AND NECK TECHNIQUE: Multidetector CT imaging of the head and neck was performed using the standard protocol during bolus administration of intravenous contrast. Multiplanar CT image reconstructions and MIPs were obtained to evaluate the vascular anatomy. Carotid stenosis measurements (when applicable) are obtained utilizing NASCET criteria, using the distal internal carotid diameter as the denominator. CONTRAST:  50 mL ISOVUE-370 IOPAMIDOL (ISOVUE-370) INJECTION 76% COMPARISON:  CT head 01/14/2018, CTA 09/18/2016 FINDINGS: CTA NECK FINDINGS Aortic arch: Atherosclerotic aortic arch without aneurysm. Aberrancy right subclavian artery with retro esophageal course. Atherosclerotic disease in the subclavian artery bilaterally without  significant stenosis. Right carotid system: Right common carotid artery widely patent. Atherosclerotic calcification right carotid bifurcation. 25% diameter stenosis proximal right internal carotid artery. Left  carotid system: Left common carotid artery widely patent. Atherosclerotic calcification left carotid bifurcation. 25% diameter stenosis left internal carotid artery Vertebral arteries: Both vertebral arteries patent to the basilar without significant stenosis. Skeleton: Diffuse facet degeneration.  No acute skeletal lesion. Other neck: No soft tissue mass or edema. Upper chest: Negative Review of the MIP images confirms the above findings CTA HEAD FINDINGS Anterior circulation: Atherosclerotic calcification in the cavernous carotid bilaterally. Moderate stenosis supraclinoid internal carotid artery on the left. Mild stenosis right cavernous carotid. Extensive irregularity throughout the middle cerebral artery branches consistent with advanced atherosclerotic disease. Diffuse atherosclerotic disease in the left anterior cerebral artery which is severely stenotic in the pericallosal region. Right anterior cerebral artery patent with mild disease. Posterior circulation: Both vertebral arteries patent to the basilar. PICA patent bilaterally. Basilar widely patent. Superior cerebellar and posterior cerebral arteries are patent bilaterally. Severe stenosis left P1 segment and mild stenosis left P2 segment. Right posterior cerebral artery widely patent. Venous sinuses: Normal enhancement Anatomic variants: None Delayed phase: No enhancing lesions identified. Multiple white matter infarcts of indeterminate age. Right occipital infarct could be chronic or subacute. Review of the MIP images confirms the above findings IMPRESSION: Atherosclerotic disease of the carotid bifurcation bilaterally without flow limiting stenosis. Both vertebral arteries widely patent to the basilar. Severe intracranial atherosclerotic disease as above. This has progressed markedly since 2018. No emergent large vessel occlusion. Electronically Signed   By: Marlan Palau M.D.   On: 02/02/2018 13:10   Dg Chest Port 1 View  Result Date:  02-26-18 CLINICAL DATA:  Altered mental status EXAM: PORTABLE CHEST 1 VIEW COMPARISON:  10/14/8 FINDINGS: Cardiac shadow is enlarged. Pacing device is again seen and stable. The lungs are clear. No bony abnormality is noted. IMPRESSION: No acute abnormality noted. Electronically Signed   By: Alcide Clever M.D.   On: 02-26-18 14:19    PHYSICAL EXAM Elderly lady not in distress. . Afebrile. Head is nontraumatic. Neck is supple without bruit.    Cardiac exam no murmur or gallop. Lungs are clear to auscultation. Distal pulses are well felt.  Neurological Exam : awake alert unable to speak or follow commands. Can mimic and follow few midline commands only. Blinks to threat on the left but not as well on the right. Face appears symmetric. Tongue midline. Dense right hemiplegia weight 2/5 strength with significant drift. Unable to move right side against gravity. Purposeful antigravity movements in the left side. Right plantar equivocal left downgoing. Deep tendon reflexes are depressed on the right. ASSESSMENT/PLAN Ms. Keniyah Gelinas is a 84 y.o. female with history of HTN, TIA, SSS with pacer, DM, GERD presenting with altered mental status, R HP and hypertensive.   Stroke:  Likely Left brain subcortical infarct secondary to severe intracranial atherosclerosis   CT head multifocal old  infarcts new since March 2019 in the right occipital, right frontal periventricular white matter and left cerebellar hemisphere.  Atrophy.  Small vessel disease.  CTA head & neck B ICA atherosclerosis.  Severe intracranial atherosclerosis progressed since 2018.  No LVO.  2D Echo  pending   LDL 130  HgbA1c 6.9  Lovenox 30 mg sq daily for VTE prophylaxis  aspirin 81 mg daily prior to admission, now on aspirin 325 mg daily.   Therapy recommendations:  SNF (from SNF)  Disposition:  pending   Given  neurologic decline, treat with fluid bolus  Hypertensive Urgency  Blood pressure 250/118 on arrival   Remain  elevated . Permissive hypertension (OK if < 220/120) but gradually normalize in 5-7 days . Long-term BP goal 130-150 given severe intracranial atherosclerosis  Hyperlipidemia  Home meds: Zocor 10, resumed in hospital  LDL 130, goal < 70  Consider increase in zocor   Continue statin at discharge  Diabetes type II  HgbA1c 6.9, goal < 7.0  Controlled  Dysphagia, secondary to stroke  Failed swallow  NPO  SLP following  Other Stroke Risk Factors  Advanced age  Hx stroke/TIA  Other Active Problems  Baseline dementia  Chronic kidney disease stage III  Hospital day # 0  Annie Main, MSN, APRN, ANVP-BC, AGPCNP-BC Advanced Practice Stroke Nurse [facility] See Amion for Schedule & Pager information 02/02/2018 4:55 PM  I have personally examined this patient, reviewed notes, independently viewed imaging studies, participated in medical decision making and plan of care.ROS completed by me personally and pertinent positives fully documented  I have made any additions or clarifications directly to the above note. Agree with note above.she has presented with recurrent left brain Tia's and now appears to have converted into a stroke with increased right sided weakness. Recommend stat CT angiogram of the brain and neck which have personally reviewed shows no LVO but severe changes of diffuse intracranial atherosclerosis. CT scan shows no acute infarct. Multiple old infarcts. Recommend avoid hypotension and keep blood pressure greater than 120 systolic. Check ongoing stroke workup. Discuss with family goals of care. Discussed with the ED nurse. Greater than 50% time in this 35 minute visit was spent on coordination of care and discussion with the care team Delia Heady, MD Medical Director [facility] Pager: [phone] 02/02/2018 5:58 PM  To contact Stroke Continuity provider, please refer to [email or web address]. After hours, contact General Neurology
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
## 2018-02-02 NOTE — ED Notes (Signed)
Anna-(SR) [employer] Ordered/called @ 1050-per RN-called by Massai Hankerson 

## 2018-02-02 NOTE — Progress Notes (Signed)
Pt noted to have dry heaves and gagging reflux like she wants to vomit but unable to. Pt oral suction and mouth care done. MD notified; no new orders received. Will continue to closely monitor. Dionne Bucy RN
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
## 2018-02-02 NOTE — Progress Notes (Signed)
   02/02/18 1847  Vitals  BP (!) 228/104  MAP (mmHg) 140  BP Location Left Arm  BP Method Automatic  Patient Position (if appropriate) Lying  Pulse Rate 63  Pulse Rate Source Monitor  Resp 18  Oxygen Therapy  SpO2 95 %  O2 Device Room Air   PRN hydralazine given and reported off to oncoming RN to continue to monitor and recheck VS. Dionne Bucy RN

## 2018-02-02 NOTE — ED Notes (Signed)
Pt linens changed; placed on hospital bed for comfort; pt resting; remains nonverbal

## 2018-02-02 NOTE — Progress Notes (Signed)
  Echocardiogram 2D Echocardiogram has been performed with Definity.  Gerda Diss 02/02/2018, 10:18 AM

## 2018-02-02 NOTE — ED Notes (Signed)
Attempted report x1. 

## 2018-02-02 NOTE — Progress Notes (Signed)
Pt admitted to the unit from ED. Pt alert but non-verbal and unable to follow command. Telemetry applied and verified with CCMD; second RN called to verify. Pt skin intact with no opened wounds or pressure ulcer noted except for bruises/ecchymosis noted to BUE and right buttock check. Pt oriented to the unit and room; call light within reach and bed alarm on; suctioning set up as pt noted to be gagging but unable to spit out or follow command to spit. Will continue to closely monitor pt. PLeotis Pain Demba Nigh RN    02/02/18 1345  Vitals  BP (!) 190/85  MAP (mmHg) 118  BP Location Left Arm  BP Method Automatic  Patient Position (if appropriate) Lying  Pulse Rate 70  Pulse Rate Source Monitor  Resp 16  Oxygen Therapy  SpO2 97 %  O2 Device Room Air  PAINAD (Pain Assessment in Advanced Dementia)  Breathing 0  Negative Vocalization 0  Facial Expression 0  Body Language 0  Consolability 0  PAINAD Score 0  Height and Weight  Weight 64.3 kg  Type of Scale Used Bed  Type of Weight Actual  BMI (Calculated) 24.32

## 2018-02-02 NOTE — Progress Notes (Signed)
PT Cancellation Note  Patient Details Name: Monica Choi MRN: [id number] DOB: Feb 27, 1934   Cancelled Treatment:    Reason Eval/Treat Not Completed: Patient at procedure or test/unavailable Pt currently out of room for CT. Will check back as schedule allows.   Gladys Damme, PT, DPT  Acute Rehabilitation Services  Pager: [phone] Office: [phone]   [name] 02/02/2018, 12:33 PM

## 2018-02-02 NOTE — Progress Notes (Signed)
RN called to room to assist with pt. Pt vomited green bile appearing liquid. Pt suctioned and noted to de-sat on room air. Pt placed on 2 Loxygen noted to be 93 and up to 3L oxgygen. Vitals taken; provider on-call paged and notified of questionable aspiration. Pt cleaned and changed; family remains at bedside and RN Karena Addison notified and aware. Dionne Bucy RN

## 2018-02-02 NOTE — ED Notes (Signed)
Notified neurology that pt's neuro assessment is different from what has been documented in MD notes from earlier yesterday evening. This RN received report that pt had right arm weakness, right leg weakness and was aphasic all night according to night shift RN's assessment. Upon further investigation in notes, it appears pt had a change in neuro status when the night shift RN assessed at 0325, so new LSN being 0200. This was not reported at the time. Neurology is now aware and has assessed pt.

## 2018-02-02 NOTE — Evaluation (Signed)
Clinical/Bedside Swallow Evaluation Patient Details  Name: Manaia Samad MRN: [id number] Date of Birth: 1934/04/07  Today's Date: 02/02/2018 Time: SLP Start Time (ACUTE ONLY): 1425 SLP Stop Time (ACUTE ONLY): 1445 SLP Time Calculation (min) (ACUTE ONLY): 20 min  Past Medical History:  Past Medical History:  Diagnosis Date  . Anemia   . Anxiety   . Arrhythmia   . Diabetes mellitus without complication (HCC)   . Diverticulitis   . GERD (gastroesophageal reflux disease)   . Hypertension   . Presence of permanent cardiac pacemaker    hx/notes 11/25/2016  . Rectal prolapse   . Renal disorder   . Rhabdomyolysis 11/2016  . Sick sinus syndrome (HCC)    hx/notes 11/25/2016  . Sinus node dysfunction (HCC)   . TIA (transient ischemic attack) 07/2016   hx/notes 11/25/2016   Past Surgical History:  Past Surgical History:  Procedure Laterality Date  . ABDOMINAL HYSTERECTOMY    . ABDOMINAL SURGERY    . CARDIAC SURGERY    . CATARACT EXTRACTION Bilateral   . CHOLECYSTECTOMY    . COLON SURGERY    . HERNIA REPAIR    . INSERT / REPLACE / REMOVE PACEMAKER     hx/notes 11/25/2016  . KNEE SURGERY    . NASAL SINUS SURGERY     HPI:  Pt is an 84 y.o. female with a hx of GERD, dementia, wheelchair-bound, HTN, DM2, sinus node dysfunction status post pacemaker, HLD, and HTN who was brought to the ER after a possible syncopal spell at her facility, after which she was found to have developed right-sided weakness and difficulty talking. Symptoms reportedly resolved 15 minutes after reaching the ER. CT Head showed multifocal infarcts (R occipital lobe, R frontal periventricular white matter, L cerebellar hemisphere) that are new since March 2019 scans, but not acute. Pt initially passed the [facility] swallow screen, but when readministered due to change in status, she did not. Previous BSE in November 2018 with functional oropharyngeal swallow.   Assessment / Plan / Recommendation Clinical Impression  Pt has  reduced bolus awareness, decreased labial seal with resultant anterior spillage, and suspected delayed swallow given prolonged time from presentation until swallow could be palpated. She is not following commands. Although she is showing signs of dysphagia, she is triggering a swallow. Her mentation precludes her from being able to start a diet though. Recommend that she remain NPO for today with additional SLP f/u. SLP Visit Diagnosis: Dysphagia, unspecified (R13.10)    Aspiration Risk  Moderate aspiration risk;Severe aspiration risk    Diet Recommendation NPO   Medication Administration: Via alternative means    Other  Recommendations Oral Care Recommendations: Oral care QID Other Recommendations: Have oral suction available   Follow up Recommendations Skilled Nursing facility      Frequency and Duration min 2x/week  2 weeks       Prognosis Prognosis for Safe Diet Advancement: Good Barriers to Reach Goals: Language deficits      Swallow Study   General HPI: Pt is an 84 y.o. female with a hx of GERD, dementia, wheelchair-bound, HTN, DM2, sinus node dysfunction status post pacemaker, HLD, and HTN who was brought to the ER after a possible syncopal spell at her facility, after which she was found to have developed right-sided weakness and difficulty talking. Symptoms reportedly resolved 15 minutes after reaching the ER. CT Head showed multifocal infarcts (R occipital lobe, R frontal periventricular white matter, L cerebellar hemisphere) that are new since March 2019 scans, but not  acute. Pt initially passed the [facility] swallow screen, but when readministered due to change in status, she did not. Previous BSE in November 2018 with functional oropharyngeal swallow. Type of Study: Bedside Swallow Evaluation Previous Swallow Assessment: see HPI Diet Prior to this Study: NPO Temperature Spikes Noted: No Respiratory Status: Room air History of Recent Intubation: No Behavior/Cognition:  Alert;Doesn't follow directions Oral Cavity Assessment: (not able to visualize well) Oral Care Completed by SLP: (attempted - difficult to get brush into mouth) Oral Cavity - Dentition: Adequate natural dentition(as can be visualized) Vision: (baseline difficulties per SIL) Self-Feeding Abilities: Total assist Patient Positioning: Upright in bed Baseline Vocal Quality: Low vocal intensity Volitional Cough: Cognitively unable to elicit Volitional Swallow: Unable to elicit    Oral/Motor/Sensory Function Overall Oral Motor/Sensory Function: (unable to follow commands for assessment)   Ice Chips Ice chips: Impaired Presentation: Spoon Oral Phase Impairments: Poor awareness of bolus Pharyngeal Phase Impairments: Suspected delayed Swallow   Thin Liquid Thin Liquid: Impaired Presentation: Cup;Spoon Oral Phase Impairments: Poor awareness of bolus;Reduced labial seal Oral Phase Functional Implications: Right anterior spillage Pharyngeal  Phase Impairments: Suspected delayed Swallow;Other (comments)(sharp inhalation post-swallow)    Nectar Thick Nectar Thick Liquid: Not tested   Honey Thick Honey Thick Liquid: Not tested   Puree Puree: Not tested   Solid     Solid: Not tested      [name] 02/02/2018,3:25 PM  [name], M.A. CCC-SLP Acute Herbalist [phone] Office [phone]

## 2018-02-02 NOTE — ED Notes (Signed)
Pt alert to name only, only able to answer yes to some questions. Weakness noted to L arm but pt not holding either arm off the bed for 10 seconds. Both legs also noted to drop to the bed quickly. Does not follow commands either.

## 2018-02-02 NOTE — ED Notes (Signed)
Pt to CT

## 2018-02-03 ENCOUNTER — Inpatient Hospital Stay (HOSPITAL_COMMUNITY): Payer: PPO

## 2018-02-03 LAB — GLUCOSE, CAPILLARY: Glucose-Capillary: 183 mg/dL — ABNORMAL HIGH (ref 70–99)

## 2018-02-03 MED ORDER — ONDANSETRON HCL 4 MG/2ML IJ SOLN
4.0000 mg | Freq: Four times a day (QID) | INTRAMUSCULAR | Status: DC | PRN
  Administered 2018-02-03 (×2): 4 mg via INTRAVENOUS
  Filled 2018-02-03 (×2): qty 2

## 2018-02-03 MED ORDER — JEVITY 1.2 CAL PO LIQD
1000.0000 mL | ORAL | Status: DC
  Administered 2018-02-03 – 2018-02-06 (×4): 1000 mL
  Filled 2018-02-03 (×6): qty 1000

## 2018-02-03 NOTE — Procedures (Signed)
Cortrak  Person Inserting Tube:  Monica Choi, RD Tube Type:  Cortrak - 43 inches Tube Location:  Right nare Initial Placement:  Postpyloric Secured by: Bridle Technique Used to Measure Tube Placement:  Documented cm marking at nare/ corner of mouth Cortrak Secured At:  84 cm Procedure Comments:     No x-ray is required. RN may begin using tube.   If the tube becomes dislodged please keep the tube and contact the Cortrak team at [email or web address] (password [email or web address]) for replacement.  If after hours and replacement cannot be delayed, place a NG tube and confirm placement with an abdominal x-ray.   Vanessa Kick RD, LDN Clinical Nutrition Pager # [phone]

## 2018-02-03 NOTE — Evaluation (Addendum)
Physical Therapy Evaluation Patient Details Name: Monica Choi MRN: [id number] DOB: 04-22-1933 Today's Date: 02/03/2018   History of Present Illness  Pt is an 84 y/o female admitted secondary to having a syncopal episode, as well as R sided weakness and AMS. MRI revealed interval development of multifocal infarcts since 06/16/17. PMH including but not limited to dementia, DM, HTN and TIA.    Clinical Impression  Pt presented supine in bed with HOB elevated, initially asleep and lethargic throughout. No family or caregivers present to provide any reliable information regarding PLOF or home environment. Per chart review, pt was at a SNF. She currently requires total A x2 for rolling in bed. Further mobility and assessment was deferred secondary to pt having very high BP (initially 224/100 mmHg and then 227/194 mmHg after rolling). Pt's RN was notified. Pt would continue to benefit from skilled physical therapy services at this time while admitted and after d/c to address the below listed limitations in order to improve overall safety and independence with functional mobility.     Follow Up Recommendations SNF    Equipment Recommendations  None recommended by PT    Recommendations for Other Services       Precautions / Restrictions Precautions Precautions: Fall Precaution Comments: monitor BP Restrictions Weight Bearing Restrictions: No      Mobility  Bed Mobility Overal bed mobility: Needs Assistance Bed Mobility: Rolling Rolling: Total assist;+2 for physical assistance         General bed mobility comments: total A x2 to roll towards R side x2; deferred further mobility secondary to high BP (initially 224/100 mmHg, then after rolling and RN administering meds BP was 227/194 - RN in room and aware)  Transfers                 General transfer comment: deferred (see above note)  Ambulation/Gait                Stairs            Wheelchair Mobility     Modified Rankin (Stroke Patients Only) Modified Rankin (Stroke Patients Only) Pre-Morbid Rankin Score: Moderately severe disability Modified Rankin: Severe disability     Balance                                             Pertinent Vitals/Pain Pain Assessment: Faces Faces Pain Scale: No hurt    Home Living Family/patient expects to be discharged to:: Skilled nursing facility                 Additional Comments: per chart review pt is from SNF; pt unable to provide any information or answer any questions at this time, no family/caregivers present    Prior Function Level of Independence: Needs assistance         Comments: unsure - pt not responding to any questions, no family or caregivers present to provide any information     Hand Dominance        Extremity/Trunk Assessment   Upper Extremity Assessment Upper Extremity Assessment: Defer to OT evaluation    Lower Extremity Assessment Lower Extremity Assessment: RLE deficits/detail;Difficult to assess due to impaired cognition RLE Deficits / Details: pt with extensor tone throughout; no active movement observed during eval; grimacing with noxious stimulus to plantar surface of foot       Communication   Communication:  Receptive difficulties;Expressive difficulties  Cognition Arousal/Alertness: Lethargic Behavior During Therapy: Agitated;Flat affect Overall Cognitive Status: Difficult to assess                                        General Comments      Exercises     Assessment/Plan    PT Assessment Patient needs continued PT services  PT Problem List Decreased strength;Decreased activity tolerance;Decreased balance;Decreased mobility;Decreased coordination;Decreased cognition;Decreased safety awareness;Decreased knowledge of precautions       PT Treatment Interventions Functional mobility training;Therapeutic activities;Therapeutic exercise;Balance  training;Neuromuscular re-education;Cognitive remediation;Patient/family education;DME instruction;Gait training    PT Goals (Current goals can be found in the Care Plan section)  Acute Rehab PT Goals Patient Stated Goal: unable to state PT Goal Formulation: Patient unable to participate in goal setting Time For Goal Achievement: 02/17/18 Potential to Achieve Goals: Fair    Frequency Min 3X/week   Barriers to discharge        Co-evaluation PT/OT/SLP Co-Evaluation/Treatment: Yes Reason for Co-Treatment: Complexity of the patient's impairments (multi-system involvement);For patient/therapist safety;To address functional/ADL transfers PT goals addressed during session: Mobility/safety with mobility         AM-PAC PT "6 Clicks" Daily Activity  Outcome Measure Difficulty turning over in bed (including adjusting bedclothes, sheets and blankets)?: Unable Difficulty moving from lying on back to sitting on the side of the bed? : Unable Difficulty sitting down on and standing up from a chair with arms (e.g., wheelchair, bedside commode, etc,.)?: Unable Help needed moving to and from a bed to chair (including a wheelchair)?: Total Help needed walking in hospital room?: Total Help needed climbing 3-5 steps with a railing? : Total 6 Click Score: 6    End of Session Equipment Utilized During Treatment: Oxygen Activity Tolerance: Treatment limited secondary to medical complications (Comment);Other (comment)(Pt's BP initially 224/100 mmHg and then 227/194 after roll) Patient left: in bed;with call bell/phone within reach;with bed alarm set;with SCD's reapplied Nurse Communication: Mobility status PT Visit Diagnosis: Other abnormalities of gait and mobility (R26.89);Other symptoms and signs involving the nervous system (R29.898)    Time: 1610-9604 PT Time Calculation (min) (ACUTE ONLY): 29 min   Charges:   PT Evaluation $PT Eval Moderate Complexity: 1 Mod          Deborah Chalk,  PT, DPT  Acute Rehabilitation Services Pager [phone] Office [phone]    Alessandra Bevels Bralyn Folkert 02/03/2018, 11:35 AM

## 2018-02-03 NOTE — Care Management Note (Signed)
Case Management Note  Patient Details  Name: Monica Choi MRN: [id number] Date of Birth: 04-24-1933  Subjective/Objective:      Pt in to r/o CVA. She is from [facility].               Action/Plan: Awaiting PT/OT evals. CM following for d/c disposition.   Expected Discharge Date:                  Expected Discharge Plan:     In-House Referral:     Discharge planning Services     Post Acute Care Choice:    Choice offered to:     DME Arranged:    DME Agency:     HH Arranged:    HH Agency:     Status of Service:  In process, will continue to follow  If discussed at Long Length of Stay Meetings, dates discussed:    Additional Comments:  Kermit Balo, RN 02/03/2018, 11:14 AM

## 2018-02-03 NOTE — Progress Notes (Signed)
EEG completed, results pending. 

## 2018-02-03 NOTE — Progress Notes (Signed)
STROKE TEAM PROGRESS NOTE   INTERVAL HISTORY Her grand daughter is at the bedside. CT angiogram yesterday showed severe intracranial atherosclerotic disease with multiple white matter infarcts of indeterminate age and right occipital infarct which was chronic to subacute. The patient is unable to swallow and remains nonverbal and not communicating with dense right hemiplegia  Vitals:   02/03/18 1055 02/03/18 1145 02/03/18 1223 02/03/18 1505  BP: (!) 199/83 (!) 146/66 (!) 155/71 (!) 219/191  Pulse: (!) 105 97 86   Resp: 19 15 20 20   Temp:   98.8 F (37.1 C) 98.4 F (36.9 C)  TempSrc:   Axillary Axillary  SpO2: 90% 96% 96% 93%  Weight:      Height:        CBC:  Recent Labs  Lab 01/09/2018 1613 01/18/2018 1649 02/02/18 0837  WBC 9.1  --  9.7  NEUTROABS 6.2  --   --   HGB 13.9 14.3 13.0  HCT 44.0 42.0 41.9  MCV 96.7  --  95.2  PLT 273  --  277    Basic Metabolic Panel:  Recent Labs  Lab 01/23/2018 1613 01/30/2018 1649 02/02/18 0837  NA 138 140 139  K 4.8 3.9 4.1  CL 105 107 109  CO2 22  --  20*  GLUCOSE 130* 134* 193*  BUN 26* 28* 25*  CREATININE 1.28* 1.30* 1.18*  CALCIUM 9.9  --  9.5   Lipid Panel:     Component Value Date/Time   CHOL 208 (H) 02/02/2018 0837   TRIG 161 (H) 02/02/2018 0837   HDL 46 02/02/2018 0837   CHOLHDL 4.5 02/02/2018 0837   VLDL 32 02/02/2018 0837   LDLCALC 130 (H) 02/02/2018 0837   HgbA1c:  Lab Results  Component Value Date   HGBA1C 6.9 (H) 02/02/2018   Urine Drug Screen:     Component Value Date/Time   LABOPIA NONE DETECTED 01/21/2018 1533   COCAINSCRNUR NONE DETECTED 01/05/2018 1533   COCAINSCRNUR NONE DETECTED 03/02/2017 1043   LABBENZ NONE DETECTED 01/12/2018 1533   AMPHETMU NONE DETECTED 01/30/2018 1533   THCU NONE DETECTED 01/21/2018 1533   LABBARB NONE DETECTED 01/06/2018 1533    Alcohol Level     Component Value Date/Time   ETH <10 01/17/2018 1613    IMAGING Ct Angio Head W Or Wo Contrast  Result Date:  02/02/2018 CLINICAL DATA:  Stroke EXAM: CT ANGIOGRAPHY HEAD AND NECK TECHNIQUE: Multidetector CT imaging of the head and neck was performed using the standard protocol during bolus administration of intravenous contrast. Multiplanar CT image reconstructions and MIPs were obtained to evaluate the vascular anatomy. Carotid stenosis measurements (when applicable) are obtained utilizing NASCET criteria, using the distal internal carotid diameter as the denominator. CONTRAST:  50 mL ISOVUE-370 IOPAMIDOL (ISOVUE-370) INJECTION 76% COMPARISON:  CT head 01/06/2018, CTA 09/18/2016 FINDINGS: CTA NECK FINDINGS Aortic arch: Atherosclerotic aortic arch without aneurysm. Aberrancy right subclavian artery with retro esophageal course. Atherosclerotic disease in the subclavian artery bilaterally without significant stenosis. Right carotid system: Right common carotid artery widely patent. Atherosclerotic calcification right carotid bifurcation. 25% diameter stenosis proximal right internal carotid artery. Left carotid system: Left common carotid artery widely patent. Atherosclerotic calcification left carotid bifurcation. 25% diameter stenosis left internal carotid artery Vertebral arteries: Both vertebral arteries patent to the basilar without significant stenosis. Skeleton: Diffuse facet degeneration.  No acute skeletal lesion. Other neck: No soft tissue mass or edema. Upper chest: Negative Review of the MIP images confirms the above findings CTA HEAD FINDINGS Anterior  circulation: Atherosclerotic calcification in the cavernous carotid bilaterally. Moderate stenosis supraclinoid internal carotid artery on the left. Mild stenosis right cavernous carotid. Extensive irregularity throughout the middle cerebral artery branches consistent with advanced atherosclerotic disease. Diffuse atherosclerotic disease in the left anterior cerebral artery which is severely stenotic in the pericallosal region. Right anterior cerebral artery  patent with mild disease. Posterior circulation: Both vertebral arteries patent to the basilar. PICA patent bilaterally. Basilar widely patent. Superior cerebellar and posterior cerebral arteries are patent bilaterally. Severe stenosis left P1 segment and mild stenosis left P2 segment. Right posterior cerebral artery widely patent. Venous sinuses: Normal enhancement Anatomic variants: None Delayed phase: No enhancing lesions identified. Multiple white matter infarcts of indeterminate age. Right occipital infarct could be chronic or subacute. Review of the MIP images confirms the above findings IMPRESSION: Atherosclerotic disease of the carotid bifurcation bilaterally without flow limiting stenosis. Both vertebral arteries widely patent to the basilar. Severe intracranial atherosclerotic disease as above. This has progressed markedly since 2018. No emergent large vessel occlusion. Electronically Signed   By: Marlan Palau M.D.   On: 02/02/2018 13:10   Ct Angio Neck W Or Wo Contrast  Result Date: 02/02/2018 CLINICAL DATA:  Stroke EXAM: CT ANGIOGRAPHY HEAD AND NECK TECHNIQUE: Multidetector CT imaging of the head and neck was performed using the standard protocol during bolus administration of intravenous contrast. Multiplanar CT image reconstructions and MIPs were obtained to evaluate the vascular anatomy. Carotid stenosis measurements (when applicable) are obtained utilizing NASCET criteria, using the distal internal carotid diameter as the denominator. CONTRAST:  50 mL ISOVUE-370 IOPAMIDOL (ISOVUE-370) INJECTION 76% COMPARISON:  CT head Feb 18, 2018, CTA 09/18/2016 FINDINGS: CTA NECK FINDINGS Aortic arch: Atherosclerotic aortic arch without aneurysm. Aberrancy right subclavian artery with retro esophageal course. Atherosclerotic disease in the subclavian artery bilaterally without significant stenosis. Right carotid system: Right common carotid artery widely patent. Atherosclerotic calcification right carotid  bifurcation. 25% diameter stenosis proximal right internal carotid artery. Left carotid system: Left common carotid artery widely patent. Atherosclerotic calcification left carotid bifurcation. 25% diameter stenosis left internal carotid artery Vertebral arteries: Both vertebral arteries patent to the basilar without significant stenosis. Skeleton: Diffuse facet degeneration.  No acute skeletal lesion. Other neck: No soft tissue mass or edema. Upper chest: Negative Review of the MIP images confirms the above findings CTA HEAD FINDINGS Anterior circulation: Atherosclerotic calcification in the cavernous carotid bilaterally. Moderate stenosis supraclinoid internal carotid artery on the left. Mild stenosis right cavernous carotid. Extensive irregularity throughout the middle cerebral artery branches consistent with advanced atherosclerotic disease. Diffuse atherosclerotic disease in the left anterior cerebral artery which is severely stenotic in the pericallosal region. Right anterior cerebral artery patent with mild disease. Posterior circulation: Both vertebral arteries patent to the basilar. PICA patent bilaterally. Basilar widely patent. Superior cerebellar and posterior cerebral arteries are patent bilaterally. Severe stenosis left P1 segment and mild stenosis left P2 segment. Right posterior cerebral artery widely patent. Venous sinuses: Normal enhancement Anatomic variants: None Delayed phase: No enhancing lesions identified. Multiple white matter infarcts of indeterminate age. Right occipital infarct could be chronic or subacute. Review of the MIP images confirms the above findings IMPRESSION: Atherosclerotic disease of the carotid bifurcation bilaterally without flow limiting stenosis. Both vertebral arteries widely patent to the basilar. Severe intracranial atherosclerotic disease as above. This has progressed markedly since 2018. No emergent large vessel occlusion. Electronically Signed   By: Marlan Palau  M.D.   On: 02/02/2018 13:10    PHYSICAL EXAM Elderly lady not in distress. Marland Kitchen  Afebrile. Head is nontraumatic. Neck is supple without bruit.    Cardiac exam no murmur or gallop. Lungs are clear to auscultation. Distal pulses are well felt.  Neurological Exam : awake alert unable to speak or follow commands. Can mimic and follow few midline commands only. Blinks to threat on the left but not as well on the right. Face appears symmetric. Tongue midline. Dense right hemiplegia weight 2/5 strength with significant drift. Unable to move right side against gravity. Purposeful antigravity movements in the left side. Right plantar equivocal left downgoing. Deep tendon reflexes are depressed on the right. ASSESSMENT/PLAN Ms. Geneieve Duell is a 84 y.o. female with history of HTN, TIA, SSS with pacer, DM, GERD presenting with altered mental status, R HP and hypertensive.   Stroke:  Likely Left brain subcortical infarct secondary to severe intracranial atherosclerosis   CT head multifocal old  infarcts new since March 2019 in the right occipital, right frontal periventricular white matter and left cerebellar hemisphere.  Atrophy.  Small vessel disease.  CTA head & neck B ICA atherosclerosis.  Severe intracranial atherosclerosis progressed since 2018.  No LVO. 2D Echo  Left ventricle: The cavity size was normal. There was severe concentric hypertrophy. Systolic function was normal. The estimated ejection fraction was in the range of 60% to 65%. Wall motion was normal; there were no regional wall motion  abnormalities  LDL 130  HgbA1c 6.9  Lovenox 30 mg sq daily for VTE prophylaxis  aspirin 81 mg daily prior to admission, now on aspirin 325 mg daily.   Therapy recommendations:  SNF (from SNF)  Disposition:  pending   Given neurologic decline, treat with fluid bolus  Hypertensive Urgency  Blood pressure 250/118 on arrival   Remain elevated . Permissive hypertension (OK if < 220/120) but gradually  normalize in 5-7 days . Long-term BP goal 130-150 given severe intracranial atherosclerosis  Hyperlipidemia  Home meds: Zocor 10, resumed in hospital  LDL 130, goal < 70  Consider increase in zocor   Continue statin at discharge  Diabetes type II  HgbA1c 6.9, goal < 7.0  Controlled  Dysphagia, secondary to stroke  Failed swallow  NPO  SLP following  Other Stroke Risk Factors  Advanced age  Hx stroke/TIA  Other Active Problems  Baseline dementia  Chronic kidney disease stage III  Hospital day # 1   She has presented with recurrent left brain Tia's and now appears to have converted into a left brain subcortical stroke with  right sided weakness.  The patient's prognosis seems quite guarded given significant aphasia and dense right hemiplegia.. Discussed with granddaughter at the bedside as well as with her son and daughter-in-law over the phone   goals of care. Family at this point would like panda tube for temporary feeds but if the condition does not improve over the next several days they may have to make decisions about PEG tube for comfort care. They understand and are hopeful. Discussed with Dr. Dartha Lodge Greater than 50% time in this 35 minute visit was spent on coordination of care and discussion with the care team Delia Heady, MD Medical Director [facility] Pager: [phone] 02/03/2018 4:43 PM  To contact Stroke Continuity provider, please refer to [email or web address]. After hours, contact General Neurology
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
## 2018-02-03 NOTE — Progress Notes (Signed)
Initial Nutrition Assessment  DOCUMENTATION CODES:   Not applicable  INTERVENTION:   -Jevity 1.2 @ 60 ml/hr (1440 ml) via Cortrak -Free water flushes 150 ml Q6 hours  Provides: 1728 kcals, 80 grams protein, 1162 ml free water ( with flushes). Meets 100% of needs.   NUTRITION DIAGNOSIS:   Inadequate oral intake related to inability to eat as evidenced by NPO status.  GOAL:   Patient will meet greater than or equal to 90% of their needs  MONITOR:   Diet advancement, Labs, Weight trends, TF tolerance, I & O's, Skin  REASON FOR ASSESSMENT:   Consult Assessment of nutrition requirement/status  ASSESSMENT:   Patient with PMH significant for dementia, wheelchair-bound, HTN, DM, sinus node dysfunction s/p pacemaker placement, HLD, and HTN. Presents this admission with compliant of having right-sided weakness and difficulty talking. Admitted for TIA (unable to perform MRI due to pacemaker) and hypertensive emergency.    Spoke with granddaughter at bedside. She reports the pt's appetite seemed to be normal PTA. She lives at nursing facility where she is provided with 3 meals daily and snacks. Granddaughter states pt would finish majority of her meals and is unsure if they offered the pt supplementation. She reports, pt had no issues with swallowing PTA. SLP saw pt today and recommended Cortrak as pt is not very alert. Per SLP family reports understanding very little about pt's condition. Post-pyloric Cortrak placed today. RD to order TF and monitor for goals of care.   Granddaughter endorses pt's UBW stays around 145 lb and denies any recent wt loss. Records show many stated weight making it hard to determine if weight has remained stable. Nutrition-Focused physical exam completed.   Medications reviewed and include: Vit D, Vit B12 Labs reviewed: CBG 130-194  NUTRITION - FOCUSED PHYSICAL EXAM:    Most Recent Value  Orbital Region  No depletion  Upper Arm Region  No depletion   Thoracic and Lumbar Region  Unable to assess  Buccal Region  No depletion  Temple Region  Mild depletion  Clavicle Bone Region  Mild depletion  Clavicle and Acromion Bone Region  No depletion  Scapular Bone Region  Unable to assess  Dorsal Hand  No depletion  Patellar Region  No depletion  Anterior Thigh Region  No depletion  Posterior Calf Region  No depletion  Edema (RD Assessment)  None     Diet Order:   Diet Order            Diet NPO time specified Except for: Sips with Meds  Diet effective now              EDUCATION NEEDS:   Not appropriate for education at this time  Skin:  Skin Assessment: Skin Integrity Issues: Skin Integrity Issues:: DTI DTI: left buttocks  Last BM:  PTA  Height:   Ht Readings from Last 1 Encounters:  01/17/2018 5\' 4"  (1.626 m)    Weight:   Wt Readings from Last 1 Encounters:  02/02/18 64.3 kg    Ideal Body Weight:  54.5 kg  BMI:  Body mass index is 24.33 kg/m.  Estimated Nutritional Needs:   Kcal:  1600-1800 kcal  Protein:  80-95 grams  Fluid:  >/= 1.6 L/day  Vanessa Kick RD, LDN Clinical Nutrition Pager # - [phone]

## 2018-02-03 NOTE — Progress Notes (Signed)
Cortrak team notified of need.

## 2018-02-03 NOTE — Progress Notes (Signed)
  Speech Language Pathology Treatment: Dysphagia  Patient Details Name: Monica Choi MRN: [id number] DOB: December 17, 1933 Today's Date: 02/03/2018 Time: 1335-1400 SLP Time Calculation (min) (ACUTE ONLY): 25 min  Assessment / Plan / Recommendation Clinical Impression  Pt is awake, able to focus attention to speaker in left visual field briefly. With max visual verbal cues pt initiated some movement with basic functional tasks - reaching for cup, holding cup, moving cup to mouth. Questionably followed command to squeeze hand with tactile cues, may have shook head no to question, appeared to recognize granddaughter. Other than that, pt staring blankly with no response to environment. Very minimal oral response to ice, water on spoon. Very late, reflexive appearing swallow response. Family reports understanding very little about pts condition. Cortrak will be needed unless family elects comfort measures which may be appropriate. Discussed with Monica Main, NP.    HPI HPI: Pt is an 84 y.o. female with a hx of GERD, dementia, wheelchair-bound, HTN, DM2, sinus node dysfunction status post pacemaker, HLD, and HTN who was brought to the ER after a possible syncopal spell at her facility, after which she was found to have developed right-sided weakness and difficulty talking. Symptoms reportedly resolved 15 minutes after reaching the ER. CT Head showed multifocal infarcts (R occipital lobe, R frontal periventricular white matter, L cerebellar hemisphere) that are new since March 2019 scans, but not acute. Pt initially passed the Yale swallow screen, but when readministered due to change in status, she did not. Previous BSE in November 2018 with functional oropharyngeal swallow.      SLP Plan  Continue with current plan of care       Recommendations  Diet recommendations: NPO                Oral Care Recommendations: Oral care QID Follow up Recommendations: Skilled Nursing facility Plan: Continue with  current plan of care       GO               Monica Ditty, MA CCC-SLP  Acute Rehabilitation Services Pager [phone] Office [phone]  Monica Choi 02/03/2018, 2:23 PM

## 2018-02-03 NOTE — Evaluation (Addendum)
Occupational Therapy Evaluation Patient Details Name: Monica Choi MRN: [id number] DOB: January 26, 1934 Today's Date: 02/03/2018    History of Present Illness Pt is an 84 y/o female admitted secondary to having a syncopal episode, as well as R sided weakness and AMS. MRI revealed interval development of multifocal infarcts since 06/16/17. PMH including but not limited to dementia, DM, HTN and TIA.   Clinical Impression   Patient supine in bed with HOB elevated, lethargic throughout evaluation.  RN cleared for participation.  Unable to provide PLOF, but per chart review patient from SNF and [facility]. Pt currently requires +2 total assist for bed mobility, rolling and boosting to [facility], total assist to wash face.  Further mobility or ADLs deferred due to high BP (initally 224/100 and then 227/194 after rolling), RN notified. Noted R hemi paresis, L inattention visually/UE throughout session. Patient will benefit from continued OT services while admitted and after dc at SNF level in order to optimize return to PLOF. Will continue to follow.     Follow Up Recommendations  SNF;Supervision/Assistance - 24 hour    Equipment Recommendations  Other (comment)(TBD at next venue of care)    Recommendations for Other Services       Precautions / Restrictions Precautions Precautions: Fall Precaution Comments: monitor BP Restrictions Weight Bearing Restrictions: No      Mobility Bed Mobility Overal bed mobility: Needs Assistance Bed Mobility: Rolling Rolling: Total assist;+2 for physical assistance         General bed mobility comments: total A x2 to roll towards R side x2; deferred further mobility secondary to high BP (initially 224/100 mmHg, then after rolling and RN administering meds BP was 227/194 - RN in room and aware)  Transfers                 General transfer comment: deferred (see above note)    Balance                                           ADL  either performed or assessed with clinical judgement   ADL Overall ADL's : Needs assistance/impaired     Grooming: Wash/dry face;Total assistance;Bed level                                 General ADL Comments: at this point requires total assist for all self care, bed level     Vision   Additional Comments: difficult to assess, pt with R gaze preference and unable get pt to follow past midline with eyes or head     Perception Perception Perception Tested?: Yes Perception Deficits: Inattention/neglect Inattention/Neglect: Does not attend to left visual field;Does not attend to left side of body   Praxis      Pertinent Vitals/Pain Pain Assessment: Faces Faces Pain Scale: No hurt     Hand Dominance     Extremity/Trunk Assessment Upper Extremity Assessment Upper Extremity Assessment: RUE deficits/detail;Difficult to assess due to impaired cognition;LUE deficits/detail RUE Deficits / Details: flaccid hemiparesis, PROM WFL  LUE Deficits / Details: able to grasp and control descend of UE to bed   Lower Extremity Assessment Lower Extremity Assessment: Defer to PT evaluation RLE Deficits / Details: pt with extensor tone throughout; no active movement observed during eval; grimacing with noxious stimulus to plantar surface of foot  Communication Communication Communication: Receptive difficulties;Expressive difficulties   Cognition Arousal/Alertness: Lethargic Behavior During Therapy: Agitated;Flat affect Overall Cognitive Status: Difficult to assess                                     General Comments       Exercises     Shoulder Instructions      Home Living Family/patient expects to be discharged to:: Skilled nursing facility                                 Additional Comments: per chart review pt is from SNF; pt unable to provide any information or answer any questions at this time, no family/caregivers present       Prior Functioning/Environment Level of Independence: Needs assistance        Comments: unsure - pt not responding to any questions, no family or caregivers present to provide any information; per chart review [facility] bound        OT Problem List: Decreased strength;Decreased range of motion;Decreased activity tolerance;Impaired balance (sitting and/or standing);Decreased coordination;Decreased cognition;Decreased safety awareness;Decreased knowledge of use of DME or AE;Impaired vision/perception;Decreased knowledge of precautions;Cardiopulmonary status limiting activity;Impaired sensation;Impaired tone;Impaired UE functional use      OT Treatment/Interventions: Self-care/ADL training;Therapeutic exercise;Neuromuscular education;Balance training;Patient/family education;Visual/perceptual remediation/compensation;Cognitive remediation/compensation;Therapeutic activities    OT Goals(Current goals can be found in the care plan section) Acute Rehab OT Goals Patient Stated Goal: unable to state OT Goal Formulation: Patient unable to participate in goal setting Time For Goal Achievement: 02/17/18 Potential to Achieve Goals: Fair  OT Frequency: Min 2X/week   Barriers to D/C:            Co-evaluation PT/OT/SLP Co-Evaluation/Treatment: Yes Reason for Co-Treatment: Complexity of the patient's impairments (multi-system involvement);For patient/therapist safety;To address functional/ADL transfers PT goals addressed during session: Mobility/safety with mobility OT goals addressed during session: ADL's and self-care;Strengthening/ROM      AM-PAC PT "6 Clicks" Daily Activity     Outcome Measure Help from another person eating meals?: Total Help from another person taking care of personal grooming?: Total Help from another person toileting, which includes using toliet, bedpan, or urinal?: Total Help from another person bathing (including washing, rinsing, drying)?: Total Help from another  person to put on and taking off regular upper body clothing?: Total Help from another person to put on and taking off regular lower body clothing?: Total 6 Click Score: 6   End of Session Equipment Utilized During Treatment: Oxygen Nurse Communication: Mobility status;Other (comment)(BP)  Activity Tolerance: Patient limited by lethargy Patient left: in bed;with call bell/phone within reach;with bed alarm set  OT Visit Diagnosis: Other abnormalities of gait and mobility (R26.89);Muscle weakness (generalized) (M62.81);Other symptoms and signs involving the nervous system (R29.898);Other symptoms and signs involving cognitive function;Hemiplegia and hemiparesis Hemiplegia - Right/Left: Right Hemiplegia - caused by: Cerebral infarction                Time: 1001-1026 OT Time Calculation (min): 25 min Charges:  OT General Charges $OT Visit: 1 Visit OT Evaluation $OT Eval Moderate Complexity: 1 Mod  Chancy Milroy, OT Acute Rehabilitation Services Pager [phone] Office [phone]   Chancy Milroy 02/03/2018, 1:06 PM

## 2018-02-03 NOTE — Procedures (Addendum)
History: 84 year old female being evaluated for aphasia  Sedation: None  Technique: This is a 21 channel routine scalp EEG performed at the bedside with bipolar and monopolar montages arranged in accordance to the international 10/20 system of electrode placement. One channel was dedicated to EKG recording.    Background: The background consists predominantly of irregular delta and theta activities which are poorly organized.  There is a posterior rhythm of 7 to 8 Hz which is poorly sustained.  There are occasional generalized discharges with a bifrontal shifting predominance.  There are occasional right frontotemporal discharges with triphasic morphology though with a sharply contoured initial deflexion.  There also rare left temporal discharges with similar morphology that are independent of the right sided discharges.  Photic stimulation: Physiologic driving is not performed  EEG Abnormalities: 1) occasional right frontotemporal sharp wave discharges 2) rare left frontotemporal sharp discharges 3) generalized irregular slow activity  Clinical Interpretation: This EEG is consistent with bilateral independent areas of potential epileptogenicity in the setting of a generalized nonspecific cerebral dysfunction (encephalopathy).  Though the morphology was that of triphasic waves, the bilateral independence would be suggestive rather of cortical irritability.   There was no seizure recorded on this study.   Ritta Slot, MD [facility] [phone]  If 7pm- 7am, please page neurology on call as listed in AMION.

## 2018-02-04 ENCOUNTER — Inpatient Hospital Stay (HOSPITAL_COMMUNITY): Payer: PPO

## 2018-02-04 DIAGNOSIS — R569 Unspecified convulsions: Secondary | ICD-10-CM

## 2018-02-04 LAB — GLUCOSE, CAPILLARY
GLUCOSE-CAPILLARY: 161 mg/dL — AB (ref 70–99)
GLUCOSE-CAPILLARY: 178 mg/dL — AB (ref 70–99)
GLUCOSE-CAPILLARY: 210 mg/dL — AB (ref 70–99)
GLUCOSE-CAPILLARY: 294 mg/dL — AB (ref 70–99)
Glucose-Capillary: 191 mg/dL — ABNORMAL HIGH (ref 70–99)
Glucose-Capillary: 211 mg/dL — ABNORMAL HIGH (ref 70–99)
Glucose-Capillary: 350 mg/dL — ABNORMAL HIGH (ref 70–99)

## 2018-02-04 MED ORDER — LORAZEPAM 2 MG/ML IJ SOLN
INTRAMUSCULAR | Status: AC
  Filled 2018-02-04: qty 1

## 2018-02-04 MED ORDER — INSULIN GLARGINE 100 UNIT/ML ~~LOC~~ SOLN
10.0000 [IU] | Freq: Two times a day (BID) | SUBCUTANEOUS | Status: DC
  Administered 2018-02-04 – 2018-02-06 (×5): 10 [IU] via SUBCUTANEOUS
  Filled 2018-02-04 (×6): qty 0.1

## 2018-02-04 MED ORDER — INSULIN ASPART 100 UNIT/ML ~~LOC~~ SOLN
0.0000 [IU] | Freq: Every day | SUBCUTANEOUS | Status: DC
  Administered 2018-02-05: 2 [IU] via SUBCUTANEOUS

## 2018-02-04 MED ORDER — HYDRALAZINE HCL 20 MG/ML IJ SOLN
10.0000 mg | Freq: Once | INTRAMUSCULAR | Status: AC
  Administered 2018-02-04: 10 mg via INTRAVENOUS

## 2018-02-04 MED ORDER — INSULIN ASPART 100 UNIT/ML ~~LOC~~ SOLN
0.0000 [IU] | Freq: Three times a day (TID) | SUBCUTANEOUS | Status: DC
  Administered 2018-02-04: 3 [IU] via SUBCUTANEOUS
  Administered 2018-02-05: 7 [IU] via SUBCUTANEOUS
  Administered 2018-02-05: 2 [IU] via SUBCUTANEOUS
  Administered 2018-02-05 – 2018-02-06 (×3): 3 [IU] via SUBCUTANEOUS

## 2018-02-04 MED ORDER — METOPROLOL TARTRATE 5 MG/5ML IV SOLN
5.0000 mg | Freq: Once | INTRAVENOUS | Status: AC
  Administered 2018-02-04: 5 mg via INTRAVENOUS
  Filled 2018-02-04: qty 5

## 2018-02-04 MED ORDER — LORAZEPAM 2 MG/ML IJ SOLN
1.0000 mg | Freq: Once | INTRAMUSCULAR | Status: AC
  Administered 2018-02-04: 1 mg via INTRAVENOUS

## 2018-02-04 MED ORDER — LEVETIRACETAM IN NACL 1000 MG/100ML IV SOLN
1000.0000 mg | Freq: Two times a day (BID) | INTRAVENOUS | Status: DC
  Administered 2018-02-04 – 2018-02-05 (×3): 1000 mg via INTRAVENOUS
  Filled 2018-02-04 (×4): qty 100

## 2018-02-04 NOTE — Progress Notes (Signed)
Patient granddaughter witness a same seizure like activity on her Rt side earlier that lasted for a minute and half.

## 2018-02-04 NOTE — Progress Notes (Signed)
Notified physician of trending blood pressures. Order given to administer hydralazine, upon recheck of blood pressure, now heart rate is sustaining at 130. MD notified awaiting orders.

## 2018-02-04 NOTE — Progress Notes (Signed)
STROKE TEAM PROGRESS NOTE   INTERVAL HISTORY Her  Family  is not at the bedside.  The patient is unable to swallow and remains nonverbal and not communicating with dense right hemiplegia.tube feeds have been started  Vitals:   02/04/18 0709 02/04/18 0724 02/04/18 0910 02/04/18 1209  BP: (!) 224/91 (!) 202/79 (!) 143/58 137/64  Pulse: 97 92 92 100  Resp: 19 19 (!) 27 (!) 21  Temp:   99 F (37.2 C) 98.6 F (37 C)  TempSrc:   Axillary Axillary  SpO2: 93% 94% 94% 95%  Weight:      Height:        CBC:  Recent Labs  Lab Feb 28, 2018 1613 02/28/2018 1649 02/02/18 0837  WBC 9.1  --  9.7  NEUTROABS 6.2  --   --   HGB 13.9 14.3 13.0  HCT 44.0 42.0 41.9  MCV 96.7  --  95.2  PLT 273  --  277    Basic Metabolic Panel:  Recent Labs  Lab February 28, 2018 1613 February 28, 2018 1649 02/02/18 0837  NA 138 140 139  K 4.8 3.9 4.1  CL 105 107 109  CO2 22  --  20*  GLUCOSE 130* 134* 193*  BUN 26* 28* 25*  CREATININE 1.28* 1.30* 1.18*  CALCIUM 9.9  --  9.5   Lipid Panel:     Component Value Date/Time   CHOL 208 (H) 02/02/2018 0837   TRIG 161 (H) 02/02/2018 0837   HDL 46 02/02/2018 0837   CHOLHDL 4.5 02/02/2018 0837   VLDL 32 02/02/2018 0837   LDLCALC 130 (H) 02/02/2018 0837   HgbA1c:  Lab Results  Component Value Date   HGBA1C 6.9 (H) 02/02/2018   Urine Drug Screen:     Component Value Date/Time   LABOPIA NONE DETECTED February 28, 2018 1533   COCAINSCRNUR NONE DETECTED Feb 28, 2018 1533   COCAINSCRNUR NONE DETECTED 03/02/2017 1043   LABBENZ NONE DETECTED 28-Feb-2018 1533   AMPHETMU NONE DETECTED 2018/02/28 1533   THCU NONE DETECTED Feb 28, 2018 1533   LABBARB NONE DETECTED 28-Feb-2018 1533    Alcohol Level     Component Value Date/Time   ETH <10 28-Feb-2018 1613    IMAGING No results found.  PHYSICAL EXAM Elderly lady not in distress. . Afebrile. Head is nontraumatic. Neck is supple without bruit.    Cardiac exam no murmur or gallop. Lungs are clear to auscultation. Distal pulses are well  felt.  Neurological Exam : drowsy and does not open eyes even to sternal rub unable to speak or follow commands.   Blinks to threat on the left but not as well on the right. Face appears symmetric. Tongue midline. Dense right hemiplegia weight 2/5 strength with significant drift. Unable to move right side against gravity. Purposeful antigravity movements in the left side. Right plantar equivocal left downgoing. Deep tendon reflexes are depressed on the right. ASSESSMENT/PLAN [name] is a 84 y.o. female with history of HTN, TIA, SSS with pacer, DM, GERD presenting with altered mental status, R HP and hypertensive.   Stroke:  Likely Left brain subcortical infarct secondary to severe intracranial atherosclerosis   CT head multifocal old  infarcts new since March 2019 in the right occipital, right frontal periventricular white matter and left cerebellar hemisphere.  Atrophy.  Small vessel disease.  CTA head & neck B ICA atherosclerosis.  Severe intracranial atherosclerosis progressed since 2018.  No LVO. 2D Echo  Left ventricle: The cavity size was normal. There was severe concentric hypertrophy. Systolic function was  normal. The estimated ejection fraction was in the range of 60% to 65%. Wall motion was normal; there were no regional wall motion  abnormalities  LDL 130  HgbA1c 6.9  Lovenox 30 mg sq daily for VTE prophylaxis  aspirin 81 mg daily prior to admission, now on aspirin 325 mg daily.   Therapy recommendations:  SNF (from SNF)  Disposition:  pending   Given neurologic decline, treat with fluid bolus  Hypertensive Urgency  Blood pressure 250/118 on arrival   Remain elevated . Permissive hypertension (OK if < 220/120) but gradually normalize in 5-7 days . Long-term BP goal 130-150 given severe intracranial atherosclerosis  Hyperlipidemia  Home meds: Zocor 10, resumed in hospital  LDL 130, goal < 70  Consider increase in zocor   Continue statin at  discharge  Diabetes type II  HgbA1c 6.9, goal < 7.0  Controlled  Dysphagia, secondary to stroke  Failed swallow  NPO  SLP following  Other Stroke Risk Factors  Advanced age  Hx stroke/TIA  Other Active Problems  Baseline dementia  Chronic kidney disease stage III  Hospital day # 2   She has presented with recurrent left brain Tia's and now appears to have converted into a left brain subcortical stroke with  right sided weakness.  The patient's prognosis seems quite guarded given significant aphasia and dense right hemiplegia.. Discussed on 02/03/18 with granddaughter at the bedside as well as with her son and daughter-in-law over the phone   goals of care. Family at this point would like panda tube for temporary feeds but if the condition does not improve over the next several days they may have to make decisions about PEG tube for comfort care. They understand and are hopeful.   Delia Heady, MD Medical Director [facility] Pager: [phone] 02/04/2018 2:39 PM  To contact Stroke Continuity provider, please refer to [email or web address]. After hours, contact General Neurology

## 2018-02-04 NOTE — Progress Notes (Signed)
Patients blood sugar above parameters of 160. Contacted physician on call.  Per physician on call no new orders given.

## 2018-02-04 NOTE — Progress Notes (Signed)
LTM EEG set up and running. No initial skin breakdown at hookup. Push button tested

## 2018-02-04 NOTE — Progress Notes (Signed)
RN call by OT to the room,patient has another episode of seizure like activity,  Per OT patient started with twithing of Rt side of abodomen,  RN witnessed the twitching of Rt head and hand lasted 2 miins.  Do Obgata notified and will call Neurologist.

## 2018-02-04 NOTE — Progress Notes (Signed)
Order given to administer Metoprolol, patient is stable and resting comfortably.

## 2018-02-04 NOTE — Progress Notes (Signed)
SLP Cancellation Note  Patient Details Name: Monica Choi MRN: [id number] DOB: [birth year]   Cancelled treatment:       Reason Eval/Treat Not Completed: Patient not medically ready. With question of seizure like activity and no improvement in responsiveness there is no indication pt will progress with swallowing today. Will f/u tomorrow.    Anup Brigham, Riley Nearing 02/04/2018, 2:54 PM

## 2018-02-04 NOTE — Progress Notes (Signed)
Physical Therapy Treatment Patient Details Name: Monica Choi MRN: [id number] DOB: 07-31-1933 Today's Date: 02/04/2018    History of Present Illness Pt is an 84 y/o female admitted secondary to having a syncopal episode, as well as R sided weakness and AMS. MRI revealed interval development of multifocal infarcts since 06/16/17. PMH including but not limited to dementia, DM, HTN and TIA. Pt now with new seizure-like activity.    PT Comments    Pt remains very limited secondary to increase in BP with new onset seizure-like activity at end of session. Pt with jerking movement of trunk and R UE. Pt's RN was immediately notified. PT will continue to follow pt acutely to progress mobility as tolerated and appropriate.   BP at beginning of session: 158/64 mmHg BP at end of session: 206/77 mmHg    Follow Up Recommendations  SNF     Equipment Recommendations  None recommended by PT    Recommendations for Other Services       Precautions / Restrictions Precautions Precautions: Fall Precaution Comments: monitor BP Restrictions Weight Bearing Restrictions: No    Mobility  Bed Mobility Overal bed mobility: Needs Assistance Bed Mobility: Rolling Rolling: Total assist;+2 for physical assistance         General bed mobility comments: total A x2 to roll bilaterally with use of bed pads; deferred further mobility secondary to increase in BP (from 158/64 to 206/77) and then had some seizure-like activity at end, RN notified  Transfers                 General transfer comment: deferred (see above note)  Ambulation/Gait                 Stairs             Wheelchair Mobility    Modified Rankin (Stroke Patients Only) Modified Rankin (Stroke Patients Only) Pre-Morbid Rankin Score: Moderately severe disability Modified Rankin: Severe disability     Balance                                            Cognition Arousal/Alertness:  Lethargic Behavior During Therapy: Flat affect Overall Cognitive Status: Difficult to assess                                        Exercises      General Comments        Pertinent Vitals/Pain Pain Assessment: Faces Faces Pain Scale: No hurt    Home Living                      Prior Function            PT Goals (current goals can now be found in the care plan section) Acute Rehab PT Goals PT Goal Formulation: Patient unable to participate in goal setting Time For Goal Achievement: 02/17/18 Potential to Achieve Goals: Fair Progress towards PT goals: Progressing toward goals    Frequency    Min 2X/week      PT Plan Current plan remains appropriate;Frequency needs to be updated    Co-evaluation              AM-PAC PT "6 Clicks" Daily Activity  Outcome Measure  Difficulty turning over in bed (including  adjusting bedclothes, sheets and blankets)?: Unable Difficulty moving from lying on back to sitting on the side of the bed? : Unable Difficulty sitting down on and standing up from a chair with arms (e.g., wheelchair, bedside commode, etc,.)?: Unable Help needed moving to and from a bed to chair (including a wheelchair)?: Total Help needed walking in hospital room?: Total Help needed climbing 3-5 steps with a railing? : Total 6 Click Score: 6    End of Session Equipment Utilized During Treatment: Oxygen Activity Tolerance: Treatment limited secondary to medical complications (Comment);Other (comment)(increase in BP with seizure-like activity, RN notified) Patient left: in bed;with call bell/phone within reach;with bed alarm set;with SCD's reapplied;with family/visitor present Nurse Communication: Mobility status;Other (comment)(increase in BP and seizure-like activity) PT Visit Diagnosis: Other abnormalities of gait and mobility (R26.89);Other symptoms and signs involving the nervous system (R29.898)     Time: 1610-9604 PT Time  Calculation (min) (ACUTE ONLY): 24 min  Charges:  $Therapeutic Activity: 23-37 mins                     Monica Choi, Monica Choi, DPT  Acute Rehabilitation Services Pager [phone] Office [phone]     Monica Choi Monica Choi 02/04/2018, 3:26 PM

## 2018-02-04 NOTE — Progress Notes (Signed)
Inpatient Diabetes Program Recommendations  AACE/ADA: New Consensus Statement on Inpatient Glycemic Control (2015)  Target Ranges:  Prepandial:   less than 140 mg/dL      Peak postprandial:   less than 180 mg/dL (1-2 hours)      Critically ill patients:  140 - 180 mg/dL   Lab Results  Component Value Date   GLUCAP 350 (H) 02/04/2018   HGBA1C 6.9 (H) 02/02/2018    Review of Glycemic ControlResults for LIL, LEPAGE (MRN [id number]) as of 02/04/2018 11:46  Ref. Range 02/03/2018 19:37 02/04/2018 00:37 02/04/2018 03:47 02/04/2018 08:05 02/04/2018 11:38  Glucose-Capillary Latest Ref Range: 70 - 99 mg/dL 409 (H) 811 (H) 914 (H) 294 (H) 350 (H)    Diabetes history: DM Outpatient Diabetes medications: None noted Current orders for Inpatient glycemic control:  None Jevity 60 ml/hr Inpatient Diabetes Program Recommendations:   Consider adding Novolog sensitive correction q 4 hours while on tube feeds.   Thanks,  Beryl Meager, RN, BC-ADM Inpatient Diabetes Coordinator Pager [phone] (8a-5p)

## 2018-02-04 NOTE — Progress Notes (Signed)
[place] TEAM 1 - Stepdown/ICU TEAM  Monica Choi  ZOX:[id number] DOB: October 13, 1933 DOA: 01/17/2018 PCP: [facility]    Brief Narrative:  84 y.o. female with a hx of dementia, wheelchair-bound, HTN, DM2, sinus node dysfunction status post pacemaker, HLD, and HTN who was brought to the ER after a possible syncopal spell at her facility, after which she was found to have developed right-sided weakness and difficulty talking.  CT head showed multifocal cerebral infarcts which were completed. Unable to do MRI due pacemaker. Patient's blood pressure initially was more than 220 systolic.  Patient remains aphasic, with right sided hemiparesis/hemiplegia.  Neurology input is appreciated.  On tube feeds.  Guarded prognosis.  Significant Events: 10/28 admit  02/04/2018: No new changes.    Subjective: No history from patient.  Patient remains aphasic.  Assessment & Plan:  Multifocal infarcts since March 2019 Noted on CT head this admit  Stroke Team is directing care. Input is highly appreciated.  Hypertension with Hypertensive urgency: Adjust medical tx and follow trend Permissive hypertension for the first 5 to 7 days, and then gradually controlled. Goal systolic blood pressure is 130 to 150 mmHg   Hyperlipidemia:  Cont usual zocor when able to take orals   Dementia Stable  CKD Stage 3 Stab;e  Diabetes mellitus type  2:  Continue to optimize.     DVT prophylaxis: lovenox  Code Status: DNR - NO CODE Family Communication: no family present at time of exam  Disposition Plan: stable for neuro tele bed   Consultants:  Neuro - Stroke Team   Antimicrobials:  none  Objective: Blood pressure (!) 143/58, pulse 92, temperature 99 F (37.2 C), temperature source Axillary, resp. rate (!) 27, height 5\' 4"  (1.626 m), weight 68.2 kg, SpO2 94 %.  Intake/Output Summary (Last 24 hours) at 02/04/2018 1242 Last data filed at 02/04/2018 0600 Gross per 24 hour  Intake 263 ml   Output -  Net 263 ml   Filed Weights   01/14/2018 1349 02/02/18 1345 02/04/18 0500  Weight: 63.5 kg 64.3 kg 68.2 kg    Examination: General: No acute respiratory distress.   Lungs: Clear to auscultation bilaterally without wheezes or crackles Cardiovascular: Regular rate and rhythm without murmur gallop or rub normal S1 and S2 Abdomen: Nontender, nondistended, soft, bowel sounds positive, no rebound, no ascites, no appreciable mass Extremities: No significant cyanosis, clubbing, or edema bilateral lower extremities Neuro: Aphasic. Right sided hemiparesis/hemiplegia.  CBC: Recent Labs  Lab 02/04/2018 1613 01/22/2018 1649 02/02/18 0837  WBC 9.1  --  9.7  NEUTROABS 6.2  --   --   HGB 13.9 14.3 13.0  HCT 44.0 42.0 41.9  MCV 96.7  --  95.2  PLT 273  --  277   Basic Metabolic Panel: Recent Labs  Lab 01/26/2018 1613 02/03/2018 1649 02/02/18 0837  NA 138 140 139  K 4.8 3.9 4.1  CL 105 107 109  CO2 22  --  20*  GLUCOSE 130* 134* 193*  BUN 26* 28* 25*  CREATININE 1.28* 1.30* 1.18*  CALCIUM 9.9  --  9.5   GFR: Estimated Creatinine Clearance: 35.5 mL/min (A) (by C-G formula based on SCr of 1.18 mg/dL (H)).  Liver Function Tests: Recent Labs  Lab 01/14/2018 1613 02/02/18 0837  AST 27 16  ALT 17 15  ALKPHOS 94 81  BILITOT 0.9 0.8  PROT 7.0 6.2*  ALBUMIN 3.9 3.4*    Coagulation Profile: Recent Labs  Lab 02/02/2018 1613  INR 0.93  HbA1C: Hgb A1c MFr Bld  Date/Time Value Ref Range Status  02/02/2018 08:37 AM 6.9 (H) 4.8 - 5.6 % Final    Comment:    (NOTE) Pre diabetes:          5.7%-6.4% Diabetes:              >6.4% Glycemic control for   <7.0% adults with diabetes   03/03/2017 05:43 AM 7.4 (H) 4.8 - 5.6 % Final    Comment:    (NOTE) Pre diabetes:          5.7%-6.4% Diabetes:              >6.4% Glycemic control for   <7.0% adults with diabetes     Scheduled Meds: . aspirin  300 mg Rectal Daily   Or  . aspirin  325 mg Oral Daily  . cholecalciferol  2,000  Units Oral Daily  . DULoxetine  30 mg Oral BID  . enoxaparin (LOVENOX) injection  30 mg Subcutaneous Daily  . iopamidol  100 mL Intravenous Once  . metoprolol tartrate  5 mg Intravenous Q6H  . pantoprazole  20 mg Oral Daily  . ramipril  10 mg Oral Daily  . risperiDONE  0.25 mg Oral QHS  . simvastatin  10 mg Oral QHS  . vitamin B-12  1,000 mcg Oral Daily     LOS: 2 days ,  Barnetta Chapel, MD [facility] Office  [phone] Pager - Text Page per Amion  If 7PM-7AM, please contact night-coverage per Amion 02/04/2018, 12:42 PM

## 2018-02-04 NOTE — Progress Notes (Signed)
r EEG complete - results pending.

## 2018-02-04 NOTE — Progress Notes (Signed)
Dr Dartha Lodge notified about the episode of ? Seizure activity and CBG resutl.

## 2018-02-04 NOTE — Procedures (Signed)
History: 84 year old female with history of recent stroke and concern for seizures  Sedation: None  Technique: This is a 21 channel routine scalp EEG performed at the bedside with bipolar and monopolar montages arranged in accordance to the international 10/20 system of electrode placement. One channel was dedicated to EKG recording.    Background: The background is relatively disorganized with generalized irregular delta and theta activities.  There are intermixed bilaterally independent right and left sharp wave discharges.  There are 2 clinical seizures consisting of right-sided abdominal clonic activity.  Electrographically this correlates to a buildup of quasi-rhythmic delta poorly localized in the left hemisphere evolving to blunted theta and then periodic sharp wave discharges which are seen bilaterally with a frequency of 0.75 to 1.5 Hz.  There is then an abrupt offset of these discharges associated with cessation of abdominal jerking.  Photic stimulation: Physiologic driving is not performed  EEG Abnormalities: 1) Two brief electrographic seizures with poorly localized onset, most likely left hemispheric 2) bilateral independent right and left frontotemporal sharp waves  Clinical Interpretation: This EEG recorded to brief electrographic seizures that most likely arise from the left hemisphere, though the electrographic onset is poorly localized.  Ritta Slot, MD [facility] [phone]  If 7pm- 7am, please page neurology on call as listed in AMION.

## 2018-02-04 NOTE — Progress Notes (Signed)
Patient is having twitching of Rt abdominal muscle and Rt leg continuously for I minute as RN walked in the room for routine round. Neurologically,  Not responding to stimuli with no changes from this morning. Patient has not opens her eyes or follows command since the beginning of the shift. Temp 98.8 pulse 100 BP 163/79  Resp.22 O2 sat 95 2 L .  CBG 350

## 2018-02-05 LAB — GLUCOSE, CAPILLARY
GLUCOSE-CAPILLARY: 240 mg/dL — AB (ref 70–99)
GLUCOSE-CAPILLARY: 207 mg/dL — AB (ref 70–99)
Glucose-Capillary: 271 mg/dL — ABNORMAL HIGH (ref 70–99)
Glucose-Capillary: 317 mg/dL — ABNORMAL HIGH (ref 70–99)
Glucose-Capillary: 167 mg/dL — ABNORMAL HIGH (ref 70–99)
Glucose-Capillary: 213 mg/dL — ABNORMAL HIGH (ref 70–99)

## 2018-02-05 MED ORDER — RISPERIDONE 0.5 MG PO TABS
0.2500 mg | ORAL_TABLET | Freq: Every day | ORAL | Status: DC
  Administered 2018-02-05: 0.25 mg
  Filled 2018-02-05: qty 1

## 2018-02-05 MED ORDER — METOPROLOL TARTRATE 25 MG PO TABS
25.0000 mg | ORAL_TABLET | Freq: Two times a day (BID) | ORAL | Status: DC
  Administered 2018-02-05: 25 mg
  Filled 2018-02-05: qty 1

## 2018-02-05 MED ORDER — LEVETIRACETAM 100 MG/ML PO SOLN
1000.0000 mg | Freq: Two times a day (BID) | ORAL | Status: DC
  Administered 2018-02-05 – 2018-02-06 (×2): 1000 mg
  Filled 2018-02-05 (×2): qty 10

## 2018-02-05 MED ORDER — RAMIPRIL 5 MG PO CAPS: 10.0000 mg | ORAL_CAPSULE | Freq: Every day | ORAL | Status: DC

## 2018-02-05 MED ORDER — PANTOPRAZOLE SODIUM 40 MG PO PACK
40.0000 mg | PACK | Freq: Every day | ORAL | Status: DC
  Administered 2018-02-05 – 2018-02-07 (×3): 40 mg
  Filled 2018-02-05 (×3): qty 20

## 2018-02-05 MED ORDER — SIMVASTATIN 5 MG PO TABS: 10.0000 mg | ORAL_TABLET | Freq: Every day | ORAL | Status: DC

## 2018-02-05 MED ORDER — ASPIRIN 300 MG RE SUPP: 300.0000 mg | Freq: Every day | RECTAL | Status: DC

## 2018-02-05 MED ORDER — RAMIPRIL 5 MG PO CAPS
10.0000 mg | ORAL_CAPSULE | Freq: Every day | ORAL | Status: DC
  Administered 2018-02-06 – 2018-02-07 (×2): 10 mg via ORAL
  Filled 2018-02-05 (×2): qty 2

## 2018-02-05 MED ORDER — METOPROLOL TARTRATE 50 MG PO TABS
50.0000 mg | ORAL_TABLET | Freq: Two times a day (BID) | ORAL | Status: DC
  Administered 2018-02-05 – 2018-02-07 (×4): 50 mg
  Filled 2018-02-05 (×4): qty 1

## 2018-02-05 MED ORDER — LEVETIRACETAM 500 MG PO TABS: 1000.0000 mg | ORAL_TABLET | Freq: Two times a day (BID) | ORAL | Status: DC

## 2018-02-05 MED ORDER — ASPIRIN 325 MG PO TABS
325.0000 mg | ORAL_TABLET | Freq: Every day | ORAL | Status: DC
  Administered 2018-02-06 – 2018-02-07 (×2): 325 mg
  Filled 2018-02-05 (×3): qty 1

## 2018-02-05 MED ORDER — SIMVASTATIN 20 MG PO TABS
20.0000 mg | ORAL_TABLET | Freq: Every day | ORAL | Status: DC
  Administered 2018-02-05 – 2018-02-06 (×2): 20 mg
  Filled 2018-02-05 (×2): qty 1

## 2018-02-05 NOTE — Progress Notes (Signed)
STROKE TEAM PROGRESS NOTE   INTERVAL HISTORY Her RN    is   at the bedside.  The patient is unable to swallow and remains nonverbal and not communicating with dense right hemiplegia.tube.habits has focal right body seizures yesterday afternoon. She is given 1 gm Keppra and EEG was obtained which showed brief electrographic seizures arising from the left hemisphere. She was kept on overnight video EEG recording which did not show any further seizures but showed evidence of a severe diffuse encephalopathy She has not had any further seizures but remained quite unresponsive and nonverbal Vitals:   02/05/18 0644 02/05/18 0818 02/05/18 1100 02/05/18 1144  BP:  (!) 186/89  (!) 190/80  Pulse: 81 85 74 79  Resp: 16 17 19    Temp:  98.7 F (37.1 C)  98 F (36.7 C)  TempSrc:  Oral  Oral  SpO2: 92% 93% 92% 93%  Weight:      Height:        CBC:  Recent Labs  Lab 02/03/2018 1613 02/03/18 1649 02/02/18 0837  WBC 9.1  --  9.7  NEUTROABS 6.2  --   --   HGB 13.9 14.3 13.0  HCT 44.0 42.0 41.9  MCV 96.7  --  95.2  PLT 273  --  277    Basic Metabolic Panel:  Recent Labs  Lab 02/03/2018 1613 02-03-18 1649 02/02/18 0837  NA 138 140 139  K 4.8 3.9 4.1  CL 105 107 109  CO2 22  --  20*  GLUCOSE 130* 134* 193*  BUN 26* 28* 25*  CREATININE 1.28* 1.30* 1.18*  CALCIUM 9.9  --  9.5   Lipid Panel:     Component Value Date/Time   CHOL 208 (H) 02/02/2018 0837   TRIG 161 (H) 02/02/2018 0837   HDL 46 02/02/2018 0837   CHOLHDL 4.5 02/02/2018 0837   VLDL 32 02/02/2018 0837   LDLCALC 130 (H) 02/02/2018 0837   HgbA1c:  Lab Results  Component Value Date   HGBA1C 6.9 (H) 02/02/2018   Urine Drug Screen:     Component Value Date/Time   LABOPIA NONE DETECTED 02-03-2018 1533   COCAINSCRNUR NONE DETECTED 2018/02/03 1533   COCAINSCRNUR NONE DETECTED 03/02/2017 1043   LABBENZ NONE DETECTED 02/03/2018 1533   AMPHETMU NONE DETECTED 03-Feb-2018 1533   THCU NONE DETECTED 2018/02/03 1533   LABBARB NONE  DETECTED 02-03-18 1533    Alcohol Level     Component Value Date/Time   ETH <10 February 03, 2018 1613    IMAGING No results found.  PHYSICAL EXAM Elderly lady not in distress. . Afebrile. Head is nontraumatic. Neck is supple without bruit.    Cardiac exam no murmur or gallop. Lungs are clear to auscultation. Distal pulses are well felt.  Neurological Exam : stuporoseand does not open eyes even to sternal rub unable to speak or follow commands.   Blinks to threat on the left but not as well on the right. Face appears symmetric. Tongue midline. Dense right hemiplegia weight 2/5 strength with significant drift. Unable to move right side against gravity. Purposeful antigravity movements in the left side. Right plantar equivocal left downgoing. Deep tendon reflexes are depressed on the right. ASSESSMENT/PLAN [name] is a 84 y.o. female with history of HTN, TIA, SSS with pacer, DM, GERD presenting with altered mental status, R HP and hypertensive.   Stroke:  Likely Left brain subcortical infarct secondary to severe intracranial atherosclerosis  New-right body focal seizures on 02/04/18  CT head multifocal  old  infarcts new since March 2019 in the right occipital, right frontal periventricular white matter and left cerebellar hemisphere.  Atrophy.  Small vessel disease.  CTA head & neck B ICA atherosclerosis.  Severe intracranial atherosclerosis progressed since 2018.  No LVO. 2D Echo  Left ventricle: The cavity size was normal. There was severe concentric hypertrophy. Systolic function was normal. The estimated ejection fraction was in the range of 60% to 65%. Wall motion was normal; there were no regional wall motion  abnormalities  LDL 130  HgbA1c 6.9  Lovenox 30 mg sq daily for VTE prophylaxis  aspirin 81 mg daily prior to admission, now on aspirin 325 mg daily.   Therapy recommendations:  SNF (from SNF)  Disposition:  pending   Given neurologic decline, treat with fluid  bolus  Hypertensive Urgency  Blood pressure 250/118 on arrival   Remain elevated . Permissive hypertension (OK if < 220/120) but gradually normalize in 5-7 days . Long-term BP goal 130-150 given severe intracranial atherosclerosis  Hyperlipidemia  Home meds: Zocor 10, resumed in hospital  LDL 130, goal < 70  Consider increase in zocor   Continue statin at discharge  Diabetes type II  HgbA1c 6.9, goal < 7.0  Controlled  Dysphagia, secondary to stroke  Failed swallow  NPO  SLP following  Other Stroke Risk Factors  Advanced age  Hx stroke/TIA  Other Active Problems  Baseline dementia  Chronic kidney disease stage III  Hospital day # 3   She has presented with recurrent left brain Tia's and now appears to have converted into a left brain subcortical stroke with  right sided weakness.  The patient's prognosis seems quite guarded given significant aphasia and dense right hemiplegia..she also had new onset focal seizures on 02/04/18 for which Keppra was started. She appears not to be encephalopathic due to being postictal and likely medication effect.Plan discontinue video EEG monitoring. Discussed on 02/04/18 with granddaughter at the bedside  Family at this point would like panda tube for temporary feeds and continuing treatment for seizures but no intubation and  if the condition does not improve over the next several days they may have to make decisions about PEG tube for comfort care. They understand and are hopeful.  Discussed with Dr. Jomarie Longs.and Kirkpatrick. Greater than 50% time during this 35 minute visit was spent on counseling and coordination of care about her stroke, aphasia, seizures and discussion with care team  Delia Heady, MD Medical Director [facility] Pager: [phone] 02/05/2018 2:18 PM  To contact Stroke Continuity provider, please refer to [email or web address]. After hours, contact General Neurology

## 2018-02-05 NOTE — Procedures (Signed)
  Video EEG Monitoring Report     Dates of recording: 02/04/2018 @ 16:47 to 02/05/2018 @ 07:30   Recording day: 1    Interpreting physician: Sherri Rad, DO       CPT: [phone]              History: 84 year old presenting with altered mental status and right sided weakness. Continuous VEEG requested to evaluate for seizures.  EEG Details: Routine Video EEG was performed using standard setting per the guidelines of American Clinical Neurophysiology Society (ACNS). A minimum of 21 electrodes were placed on scalp according to the International 10-20 or 10-10 system. Supplemental electrodes were placed as needed. Single EKG electrode was also used to detect cardiac arrhythmia. Recording was performed at a sampling rate of at least 256 Hz. Patient's behavior was continuously recorded on video simultaneously with EEG. A minimum of 18 channels were used for data display. Each epoch of study was reviewed manually daily and as needed using standard digital review software allowing for montage reformatting, gain and filter changes on a display system of sufficient resolution to prevent aliasing. Computerized quantitative EEG analysis (such as compressed spectral array analysis, dipole analysis, trending, automated spike & seizure detection) was used as indicated.  Description of EEG features: State of patient: Stupor  Dominant activity: A posterior dominant rhythm was not recorded.   Reactivity to stimulation: Present  Sleep: Normal sleep was not recorded  Nonepileptiform abnormalities: Continuous slow generalized 2-5Hz   Periodic or rhythmic abnormalities: Generalized, triphasic morphology  Epileptiform discharges: None  Paroxysmal events or seizures: None  Push button events: None  Impression: This EEG shows evidence of a severe diffuse encephalopathy. No epileptiform discharges or EEG seizures were recorded.

## 2018-02-05 NOTE — Progress Notes (Signed)
SLP Cancellation Note  Patient Details Name: Monica Choi MRN: [id number] DOB: May 21, 1933   Cancelled treatment:       Reason Eval/Treat Not Completed: Patient not medically ready. Pt continues to be minimally responsive, not able to participate in PO trials at this time.    Tayquan Gassman, Riley Nearing 02/05/2018, 9:08 AM

## 2018-02-05 NOTE — Progress Notes (Signed)
Inpatient Diabetes Program Recommendations  AACE/ADA: New Consensus Statement on Inpatient Glycemic Control (2015)  Target Ranges:  Prepandial:   less than 140 mg/dL      Peak postprandial:   less than 180 mg/dL (1-2 hours)      Critically ill patients:  140 - 180 mg/dL   Lab Results  Component Value Date   GLUCAP 240 (H) 02/05/2018   HGBA1C 6.9 (H) 02/02/2018    Review of Glycemic Control: Results for RAMIA, SIDNEY (MRN [id number]) as of 02/05/2018 11:13  Ref. Range 02/04/2018 20:37 02/04/2018 23:34 02/05/2018 04:27 02/05/2018 06:19 02/05/2018 08:16  Glucose-Capillary Latest Ref Range: 70 - 99 mg/dL 409 (H) 811 (H) 914 (H) 317 (H) 240 (H)   Inpatient Diabetes Program Recommendations:   Please consider increasing Novolog correction to q 4 hours since patient is on continuous tube feeds.   Thanks,  Beryl Meager, RN, BC-ADM Inpatient Diabetes Coordinator Pager [phone] (8a-5p)

## 2018-02-05 NOTE — Progress Notes (Signed)
vLTM EEG complete. No skin breakdown 

## 2018-02-05 NOTE — Progress Notes (Signed)
Dr. Pearlean Brownie at bedside requesting for cont EEG monitor to be discontinue. EEG tech notified.   Sim Boast, RN

## 2018-02-05 NOTE — Progress Notes (Signed)
Monica Choi  ZOX:[id number] DOB: [birth year] DOA: 01/25/2018 PCP: [facility]    Brief Narrative:  84 y.o. female with a hx of dementia, wheelchair-bound, HTN, DM2, sinus node dysfunction status post pacemaker, HLD, and HTN who was brought to the ER after a possible syncopal spell at her facility, after which she was found to have developed right-sided weakness and difficulty talking.  CT head showed multifocal cerebral infarcts which were completed. Unable to do MRI due pacemaker. Patient's blood pressure initially was more than 220 systolic.  Patient remains aphasic, with right sided hemiparesis/hemiplegia, aphasia and dysphagia -Swallow evaluation, now on tube feeds via core track -Hospitalization complicated by seizures, started Keppra 10/31  Subjective: -Remains obtunded, poorly responsive -Continuous EEG monitoring  Assessment & Plan:  Left brain subcortical infarcts  -CT head showed multifocal old infarcts which are new since March/2019 in the right occipital right frontal periventricular white matter and left cerebellar with generalized atrophy and small vessel disease , CTA head and neck showed bilateral atherosclerosis which was severe and progressed  -Unable to have MRI due to pacemaker -Now with dense right hemiplegia, dysphagia, aphasia -Echocardiogram showed EF of 60 to 65% -LDL is 130, on statin, hemoglobin A1c 6.9 -Neurology following on aspirin 325 mg daily -Hospitalization complicated by seizures, started IV Keppra 10/31 -prognosis overall appears quite poor unless shows considerable improvement on antiepileptics -Palliative medicine consulted for goals of care,   Hypertension with Hypertensive urgency: -BP was 250 x 1 1 8  on arrival, treated briefly, remains elevated -Permissive hypertension at this time, in few days will treat if remains persistently elevated  Hyperlipidemia:  Cont Zocor  Dementia Stable  CKD Stage 3 Stab;e  Diabetes  mellitus type  2:  -Controlled, hemoglobin A1c is 6.9  DVT prophylaxis: lovenox  Code Status: DNR - NO CODE Family Communication: Discussed with granddaughter at bedside Disposition Plan: Inpatient, depends on clinical course over the next 48 hours  Consultants:  Neuro - Stroke Team Palliative medicine  Antimicrobials:  none  Objective: Blood pressure (!) 186/89, pulse 79, temperature 98.7 F (37.1 C), temperature source Oral, resp. rate 19, height 5\' 4"  (1.626 m), weight 70.6 kg, SpO2 93 %.  Intake/Output Summary (Last 24 hours) at 02/05/2018 1145 Last data filed at 02/05/2018 0853 Gross per 24 hour  Intake 1618 ml  Output -  Net 1618 ml   Filed Weights   02/02/18 1345 02/04/18 0500 02/05/18 0500  Weight: 64.3 kg 68.2 kg 70.6 kg    Examination: Gen: Somnolent, poorly responsive elderly, chronically ill-appearing female HEENT: PERRLA, NG tube in situ Lungs: Decreased breath sounds at both bases CVS: 1 S2/regular rate rhythm Abd: soft, Non tender, non distended, BS present Extremities: Trace edema Skin: no new rashes Neuro: Decreased responsiveness, dense right hemiplegia  CBC: Recent Labs  Lab 01/31/2018 1613 02/02/2018 1649 02/02/18 0837  WBC 9.1  --  9.7  NEUTROABS 6.2  --   --   HGB 13.9 14.3 13.0  HCT 44.0 42.0 41.9  MCV 96.7  --  95.2  PLT 273  --  277   Basic Metabolic Panel: Recent Labs  Lab 01/11/2018 1613 01/26/2018 1649 02/02/18 0837  NA 138 140 139  K 4.8 3.9 4.1  CL 105 107 109  CO2 22  --  20*  GLUCOSE 130* 134* 193*  BUN 26* 28* 25*  CREATININE 1.28* 1.30* 1.18*  CALCIUM 9.9  --  9.5   GFR: Estimated Creatinine Clearance: 36.1 mL/min (A) (by C-G formula  based on SCr of 1.18 mg/dL (H)).  Liver Function Tests: Recent Labs  Lab 01/17/2018 1613 02/02/18 0837  AST 27 16  ALT 17 15  ALKPHOS 94 81  BILITOT 0.9 0.8  PROT 7.0 6.2*  ALBUMIN 3.9 3.4*    Coagulation Profile: Recent Labs  Lab 01/25/2018 1613  INR 0.93   HbA1C: Hgb A1c MFr  Bld  Date/Time Value Ref Range Status  02/02/2018 08:37 AM 6.9 (H) 4.8 - 5.6 % Final    Comment:    (NOTE) Pre diabetes:          5.7%-6.4% Diabetes:              >6.4% Glycemic control for   <7.0% adults with diabetes   03/03/2017 05:43 AM 7.4 (H) 4.8 - 5.6 % Final    Comment:    (NOTE) Pre diabetes:          5.7%-6.4% Diabetes:              >6.4% Glycemic control for   <7.0% adults with diabetes     Scheduled Meds: . [START ON 02/06/2018] aspirin  325 mg Per Tube Daily   Or  . [START ON 02/06/2018] aspirin  300 mg Rectal Daily  . DULoxetine  30 mg Oral BID  . enoxaparin (LOVENOX) injection  30 mg Subcutaneous Daily  . insulin aspart  0-5 Units Subcutaneous QHS  . insulin aspart  0-9 Units Subcutaneous TID WC  . insulin glargine  10 Units Subcutaneous BID  . iopamidol  100 mL Intravenous Once  . metoprolol tartrate  25 mg Per Tube BID  . pantoprazole sodium  40 mg Per Tube Daily  . risperiDONE  0.25 mg Per Tube QHS  . simvastatin  10 mg Per Tube QHS  . vitamin B-12  1,000 mcg Oral Daily     LOS: 3 days ,  Zannie Cove, MD [facility] Office  [phone] Pager - Text Page per Amion  If 7PM-7AM, please contact night-coverage per Amion 02/05/2018, 11:45 AM

## 2018-02-05 NOTE — Progress Notes (Signed)
LTM EEG checked, paste added to F3, F4 and T6. No skin breakdown at Fp1 or Fp2

## 2018-02-05 NOTE — Care Management Important Message (Signed)
Important Message  Patient Details  Name: Monica Choi MRN: [id number] Date of Birth: 1933/04/19   Medicare Important Message Given:  Yes Patient unable to sign, im left on bedside table   Orson Aloe 02/05/2018, 4:50 PM

## 2018-02-05 NOTE — Progress Notes (Signed)
PMT RN Note: Consult order noted. PMT is experiencing very high consult volume and will be staffed with emergency coverage only on 11/1 due to the team being out of the office at the [facility].   If you need interim recommendations, please call [phone] to leave a message for the nurse. All medical emergencies should be directed to the attending physician.   Once there is an available provider (likely 11/2 or 11/3 due to weekend staffing), this patient will be evaluated and seen by our team.  Margret Chance. Chasitie Passey, RN, BSN, [facility] Palliative Medicine Team 02/05/2018 9:44 AM Office [phone]

## 2018-02-05 DEATH — deceased

## 2018-02-06 ENCOUNTER — Inpatient Hospital Stay (HOSPITAL_COMMUNITY): Payer: PPO

## 2018-02-06 DIAGNOSIS — I639 Cerebral infarction, unspecified: Secondary | ICD-10-CM

## 2018-02-06 LAB — GLUCOSE, CAPILLARY
GLUCOSE-CAPILLARY: 202 mg/dL — AB (ref 70–99)
GLUCOSE-CAPILLARY: 240 mg/dL — AB (ref 70–99)
Glucose-Capillary: 200 mg/dL — ABNORMAL HIGH (ref 70–99)
Glucose-Capillary: 246 mg/dL — ABNORMAL HIGH (ref 70–99)
Glucose-Capillary: 193 mg/dL — ABNORMAL HIGH (ref 70–99)
Glucose-Capillary: 207 mg/dL — ABNORMAL HIGH (ref 70–99)

## 2018-02-06 LAB — BASIC METABOLIC PANEL
Anion gap: 9 (ref 5–15)
BUN: 54 mg/dL — ABNORMAL HIGH (ref 8–23)
CALCIUM: 9.2 mg/dL (ref 8.9–10.3)
CHLORIDE: 113 mmol/L — AB (ref 98–111)
CO2: 26 mmol/L (ref 22–32)
CREATININE: 1.49 mg/dL — AB (ref 0.44–1.00)
GFR calc Af Amer: 37 mL/min — ABNORMAL LOW (ref >60)
GFR calc non Af Amer: 32 mL/min — ABNORMAL LOW (ref >60)
Glucose, Bld: 272 mg/dL — ABNORMAL HIGH (ref 70–99)
Potassium: 3.8 mmol/L (ref 3.5–5.1)
SODIUM: 148 mmol/L — AB (ref 135–145)

## 2018-02-06 LAB — CBC
HCT: 36.6 % (ref 36.0–46.0)
Hemoglobin: 11.2 g/dL — ABNORMAL LOW (ref 12.0–15.0)
MCH: 30.3 pg (ref 26.0–34.0)
MCHC: 30.6 g/dL (ref 30.0–36.0)
MCV: 98.9 fL (ref 80.0–100.0)
PLATELETS: 222 10*3/uL (ref 150–400)
RBC: 3.7 MIL/uL — ABNORMAL LOW (ref 3.87–5.11)
RDW: 12.6 % (ref 11.5–15.5)
WBC: 10.9 10*3/uL — ABNORMAL HIGH (ref 4.0–10.5)
nRBC: 0 % (ref 0.0–0.2)

## 2018-02-06 MED ORDER — AMLODIPINE BESYLATE 10 MG PO TABS
10.0000 mg | ORAL_TABLET | Freq: Every day | ORAL | Status: DC
  Administered 2018-02-06 – 2018-02-07 (×2): 10 mg
  Filled 2018-02-06 (×3): qty 1

## 2018-02-06 MED ORDER — IPRATROPIUM-ALBUTEROL 0.5-2.5 (3) MG/3ML IN SOLN
3.0000 mL | Freq: Four times a day (QID) | RESPIRATORY_TRACT | Status: DC
  Administered 2018-02-06 – 2018-02-07 (×3): 3 mL via RESPIRATORY_TRACT
  Filled 2018-02-06 (×3): qty 3

## 2018-02-06 MED ORDER — HYDRALAZINE HCL 25 MG PO TABS
25.0000 mg | ORAL_TABLET | Freq: Three times a day (TID) | ORAL | Status: DC
  Administered 2018-02-06 – 2018-02-07 (×4): 25 mg
  Filled 2018-02-06 (×3): qty 1

## 2018-02-06 MED ORDER — JEVITY 1.2 CAL PO LIQD
1000.0000 mL | ORAL | Status: DC
  Administered 2018-02-06 – 2018-02-07 (×2): 1000 mL
  Filled 2018-02-06 (×2): qty 1000

## 2018-02-06 MED ORDER — INSULIN ASPART 100 UNIT/ML ~~LOC~~ SOLN
0.0000 [IU] | SUBCUTANEOUS | Status: DC
  Administered 2018-02-06: 2 [IU] via SUBCUTANEOUS
  Administered 2018-02-06 – 2018-02-07 (×3): 3 [IU] via SUBCUTANEOUS
  Administered 2018-02-07: 2 [IU] via SUBCUTANEOUS

## 2018-02-06 MED ORDER — INSULIN GLARGINE 100 UNIT/ML ~~LOC~~ SOLN
15.0000 [IU] | Freq: Two times a day (BID) | SUBCUTANEOUS | Status: DC
  Administered 2018-02-06: 15 [IU] via SUBCUTANEOUS
  Filled 2018-02-06 (×2): qty 0.15

## 2018-02-06 MED ORDER — METOPROLOL TARTRATE 5 MG/5ML IV SOLN
5.0000 mg | Freq: Once | INTRAVENOUS | Status: AC
  Administered 2018-02-06: 5 mg via INTRAVENOUS
  Filled 2018-02-06: qty 5

## 2018-02-06 MED ORDER — FREE WATER
200.0000 mL | Status: DC
  Administered 2018-02-06 – 2018-02-07 (×5): 200 mL

## 2018-02-06 MED ORDER — METOPROLOL TARTRATE 5 MG/5ML IV SOLN
5.0000 mg | INTRAVENOUS | Status: DC | PRN
  Administered 2018-02-06: 5 mg via INTRAVENOUS
  Filled 2018-02-06: qty 5

## 2018-02-06 MED ORDER — LEVETIRACETAM 100 MG/ML PO SOLN
500.0000 mg | Freq: Two times a day (BID) | ORAL | Status: DC
  Administered 2018-02-06: 500 mg
  Filled 2018-02-06 (×2): qty 5

## 2018-02-06 NOTE — Progress Notes (Addendum)
STROKE TEAM PROGRESS NOTE   INTERVAL HISTORY Her RN is at the bedside.  Pt is obtunded. Eyes closed, mouth breathing. No open eyes on voice or pain stimulation. BP and glucose still high. Had cortrak placement and now on TF. However, Na and Cre were elevated. CXR no pneumonia  Vitals:   02/05/18 2347 02/06/18 0400 02/06/18 0500 02/06/18 0721  BP: (!) 213/78 (!) 207/82  (!) 170/65  Pulse: 76  100   Resp: (!) 30  (!) 26   Temp: 98.6 F (37 C) 99.4 F (37.4 C)    TempSrc: Axillary Axillary    SpO2: 93%  92%   Weight:   67.7 kg   Height:        CBC:  Recent Labs  Lab 2018/02/13 1613 13-Feb-2018 1649 02/02/18 0837  WBC 9.1  --  9.7  NEUTROABS 6.2  --   --   HGB 13.9 14.3 13.0  HCT 44.0 42.0 41.9  MCV 96.7  --  95.2  PLT 273  --  277    Basic Metabolic Panel:  Recent Labs  Lab 02-13-2018 1613 02/13/2018 1649 02/02/18 0837  NA 138 140 139  K 4.8 3.9 4.1  CL 105 107 109  CO2 22  --  20*  GLUCOSE 130* 134* 193*  BUN 26* 28* 25*  CREATININE 1.28* 1.30* 1.18*  CALCIUM 9.9  --  9.5   Lipid Panel:     Component Value Date/Time   CHOL 208 (H) 02/02/2018 0837   TRIG 161 (H) 02/02/2018 0837   HDL 46 02/02/2018 0837   CHOLHDL 4.5 02/02/2018 0837   VLDL 32 02/02/2018 0837   LDLCALC 130 (H) 02/02/2018 0837   HgbA1c:  Lab Results  Component Value Date   HGBA1C 6.9 (H) 02/02/2018   Urine Drug Screen:     Component Value Date/Time   LABOPIA NONE DETECTED 02-13-18 1533   COCAINSCRNUR NONE DETECTED 02/13/18 1533   COCAINSCRNUR NONE DETECTED 03/02/2017 1043   LABBENZ NONE DETECTED 2018-02-13 1533   AMPHETMU NONE DETECTED 02-13-18 1533   THCU NONE DETECTED 02/13/18 1533   LABBARB NONE DETECTED 2018-02-13 1533    Alcohol Level     Component Value Date/Time   ETH <10 February 13, 2018 1613    IMAGING Ct Angio Head W Or Wo Contrast  Result Date: 02/02/2018 CLINICAL DATA:  Stroke EXAM: CT ANGIOGRAPHY HEAD AND NECK TECHNIQUE: Multidetector CT imaging of the head and neck  was performed using the standard protocol during bolus administration of intravenous contrast. Multiplanar CT image reconstructions and MIPs were obtained to evaluate the vascular anatomy. Carotid stenosis measurements (when applicable) are obtained utilizing NASCET criteria, using the distal internal carotid diameter as the denominator. CONTRAST:  50 mL ISOVUE-370 IOPAMIDOL (ISOVUE-370) INJECTION 76% COMPARISON:  CT head 02/13/2018, CTA 09/18/2016 FINDINGS: CTA NECK FINDINGS Aortic arch: Atherosclerotic aortic arch without aneurysm. Aberrancy right subclavian artery with retro esophageal course. Atherosclerotic disease in the subclavian artery bilaterally without significant stenosis. Right carotid system: Right common carotid artery widely patent. Atherosclerotic calcification right carotid bifurcation. 25% diameter stenosis proximal right internal carotid artery. Left carotid system: Left common carotid artery widely patent. Atherosclerotic calcification left carotid bifurcation. 25% diameter stenosis left internal carotid artery Vertebral arteries: Both vertebral arteries patent to the basilar without significant stenosis. Skeleton: Diffuse facet degeneration.  No acute skeletal lesion. Other neck: No soft tissue mass or edema. Upper chest: Negative Review of the MIP images confirms the above findings CTA HEAD FINDINGS Anterior circulation: Atherosclerotic calcification in  the cavernous carotid bilaterally. Moderate stenosis supraclinoid internal carotid artery on the left. Mild stenosis right cavernous carotid. Extensive irregularity throughout the middle cerebral artery branches consistent with advanced atherosclerotic disease. Diffuse atherosclerotic disease in the left anterior cerebral artery which is severely stenotic in the pericallosal region. Right anterior cerebral artery patent with mild disease. Posterior circulation: Both vertebral arteries patent to the basilar. PICA patent bilaterally. Basilar  widely patent. Superior cerebellar and posterior cerebral arteries are patent bilaterally. Severe stenosis left P1 segment and mild stenosis left P2 segment. Right posterior cerebral artery widely patent. Venous sinuses: Normal enhancement Anatomic variants: None Delayed phase: No enhancing lesions identified. Multiple white matter infarcts of indeterminate age. Right occipital infarct could be chronic or subacute. Review of the MIP images confirms the above findings IMPRESSION: Atherosclerotic disease of the carotid bifurcation bilaterally without flow limiting stenosis. Both vertebral arteries widely patent to the basilar. Severe intracranial atherosclerotic disease as above. This has progressed markedly since 2018. No emergent large vessel occlusion. Electronically Signed   By: Marlan Palau M.D.   On: 02/02/2018 13:10   Ct Head Wo Contrast  Result Date: 2018-02-02 CLINICAL DATA:  Syncopal episode with altered mental status and right-sided weakness. History of diabetes. EXAM: CT HEAD WITHOUT CONTRAST TECHNIQUE: Contiguous axial images were obtained from the base of the skull through the vertex without intravenous contrast. COMPARISON:  CT head 06/16/2017 and 03/03/2017. FINDINGS: Brain: There is no evidence of acute intracranial hemorrhage, mass lesion, brain edema or extra-axial fluid collection. There is diffuse prominence of the ventricles and subarachnoid spaces consistent with moderate atrophy. Chronic small vessel ischemic changes are present in the periventricular white matter bilaterally. There has been interval development of a large infarct in the right occipital lobe. There are probable additional interval small infarcts in the right frontal periventricular white matter and the left cerebellum. There is no CT evidence of acute cortical infarction. Vascular: Extensive intracranial vascular calcifications. No hyperdense vessel identified. Skull: Negative for fracture or focal lesion.  Sinuses/Orbits: Postsurgical changes in the maxillary sinuses bilaterally with mild residual mucosal thickening. No air-fluid levels are identified. The mastoid air cells, middle ears and orbital contents appear stable. Other: None. IMPRESSION: 1. Interval development of multifocal infarcts since 06/16/2017, involving the right occipital lobe, right frontal periventricular white matter and left cerebellar hemisphere. These all appear completed. 2. No CT evidence of acute infarct or hemorrhage. 3. Atrophy and chronic small vessel ischemic changes. Electronically Signed   By: Carey Bullocks M.D.   On: 2018-02-02 15:19   Ct Angio Neck W Or Wo Contrast  Result Date: 02/02/2018 CLINICAL DATA:  Stroke EXAM: CT ANGIOGRAPHY HEAD AND NECK TECHNIQUE: Multidetector CT imaging of the head and neck was performed using the standard protocol during bolus administration of intravenous contrast. Multiplanar CT image reconstructions and MIPs were obtained to evaluate the vascular anatomy. Carotid stenosis measurements (when applicable) are obtained utilizing NASCET criteria, using the distal internal carotid diameter as the denominator. CONTRAST:  50 mL ISOVUE-370 IOPAMIDOL (ISOVUE-370) INJECTION 76% COMPARISON:  CT head 2018-02-02, CTA 09/18/2016 FINDINGS: CTA NECK FINDINGS Aortic arch: Atherosclerotic aortic arch without aneurysm. Aberrancy right subclavian artery with retro esophageal course. Atherosclerotic disease in the subclavian artery bilaterally without significant stenosis. Right carotid system: Right common carotid artery widely patent. Atherosclerotic calcification right carotid bifurcation. 25% diameter stenosis proximal right internal carotid artery. Left carotid system: Left common carotid artery widely patent. Atherosclerotic calcification left carotid bifurcation. 25% diameter stenosis left internal carotid artery Vertebral arteries: Both  vertebral arteries patent to the basilar without significant stenosis.  Skeleton: Diffuse facet degeneration.  No acute skeletal lesion. Other neck: No soft tissue mass or edema. Upper chest: Negative Review of the MIP images confirms the above findings CTA HEAD FINDINGS Anterior circulation: Atherosclerotic calcification in the cavernous carotid bilaterally. Moderate stenosis supraclinoid internal carotid artery on the left. Mild stenosis right cavernous carotid. Extensive irregularity throughout the middle cerebral artery branches consistent with advanced atherosclerotic disease. Diffuse atherosclerotic disease in the left anterior cerebral artery which is severely stenotic in the pericallosal region. Right anterior cerebral artery patent with mild disease. Posterior circulation: Both vertebral arteries patent to the basilar. PICA patent bilaterally. Basilar widely patent. Superior cerebellar and posterior cerebral arteries are patent bilaterally. Severe stenosis left P1 segment and mild stenosis left P2 segment. Right posterior cerebral artery widely patent. Venous sinuses: Normal enhancement Anatomic variants: None Delayed phase: No enhancing lesions identified. Multiple white matter infarcts of indeterminate age. Right occipital infarct could be chronic or subacute. Review of the MIP images confirms the above findings IMPRESSION: Atherosclerotic disease of the carotid bifurcation bilaterally without flow limiting stenosis. Both vertebral arteries widely patent to the basilar. Severe intracranial atherosclerotic disease as above. This has progressed markedly since 2018. No emergent large vessel occlusion. Electronically Signed   By: Marlan Palau M.D.   On: 02/02/2018 13:10   Dg Chest Port 1 View  Result Date: 02/06/2018 CLINICAL DATA:  Somnolence. EXAM: PORTABLE CHEST 1 VIEW COMPARISON:  01/19/2018 FINDINGS: Feeding catheter descends below the diaphragm. Dual lead cardiac pacemaker in stable position. Enlarged cardiac silhouette. Calcific atherosclerotic disease of the aorta.  Bilateral lower lobe atelectatic changes Osseous structures are without acute abnormality. Soft tissues are grossly normal. IMPRESSION: Bilateral lower lobe atelectatic changes. Enlarged cardiac silhouette and calcific atherosclerotic disease of the aorta. Electronically Signed   By: Ted Mcalpine M.D.   On: 02/06/2018 11:11    PHYSICAL EXAM Elderly lady, obtunded, lying in bed, mouth breathing. Afebrile. Head is nontraumatic. Neck is supple without bruit. Cardiac exam no murmur or gallop. Lungs are clear to auscultation. Distal pulses are well felt.  Neurological Exam : obtunded, mouth breathing, eyes closed and not able to arouse with voice or pain. No language output. Not follow commands. With eyes forced open, no blinking to visual threat. Eyes midposition, no gaze preference, sluggish doll's eyes. Positive corneal and gag. Right facial mild droopy. Tongue midline in mouth. Dense right hemiplegia, no movement on pain. Withdraw to pain on the LUE and LLE, but did not against gravity. DTR 1+ and no babinski. Decreased tone on the right. Right plantar equivocal left downgoing. Sensation, coordination and gait not tested.   ASSESSMENT/PLAN [name] is a 84 y.o. female with history of HTN, TIA, SSS with pacer, DM, GERD presenting with altered mental status, R HP and hypertensive.   Stroke:  Likely Left brain infarct secondary to severe intracranial atherosclerosis   CT head multifocal small old infarcts new since March 2019 in the right occipital, right frontal periventricular white matter and left cerebellar hemisphere.    CTA head & neck B siphon atherosclerosis. No LVO.  2D Echo EF 60% to 65%.  Pacer interrogation showed no afib  LDL 130  HgbA1c 6.9  Lovenox 30 mg sq daily for VTE prophylaxis  aspirin 81 mg daily prior to admission, now on aspirin 325 mg daily.   Therapy recommendations:  SNF (from SNF)  Disposition:  pending   New-right body focal seizures on  02/04/18  LTM EEG no seizure  On keppra 1000mg  bid  Due to decreased LOS, will decrease keppra to 500mg  bid  Repeat EEG on Monday.   Encephalopathy  Seems out of proportion to the stroke  Given hypernatremia and elevated Cre and hyperglycemia and high BP, pt most likely to be in metabolic encephalopathy  Increase insulin dose  Add free water  Continue TF  Repeat EEG on Monday  Repeat CT head stat  Repeat CBC and BMP in am  Hypertensive Urgency  Blood pressure 250/118 on arrival   Remain elevated . Put on hydralazine, and resume metoprolol and ramipril  . Long-term BP goal 130-150 given severe intracranial atherosclerosis . BP monitoring  Hyperlipidemia  Home meds: Zocor 10, resumed in hospital  LDL 130, goal < 70  Now on zocor 20  Continue statin at discharge  Diabetes type II  HgbA1c 6.9, goal < 7.0  Controlled at home  However, significant hyperglycemia  Increase lantus to 15 bid  Change SSI to Q4h given tube feeding  CBG monitoring  Hypernatremia   Na 148  Put on free water 200cc Q4  Continue TF  Monitor BMP in am  Dysphagia, secondary to stroke  Failed swallow  NPO  Has cortrak - on TF @ 50cc  SLP following  Other Stroke Risk Factors  Advanced age  Hx stroke/TIA  Other Active Problems  Baseline dementia  Chronic kidney disease stage III Cre 1.18->1.49  SSS s/p pacer - interrogated no afib  Hospital day # 4   I spent  35 minutes in total face-to-face time with the patient, more than 50% of which was spent in counseling and coordination of care, reviewing test results, images and medication, and discussing the diagnosis of stroke, seizure, obtundation status, hyperglycemia, hypernatremia, AKI on CKD, treatment plan and potential prognosis. This patient's care requiresreview of multiple databases, neurological assessment, other specialists and medical decision making of high complexity.   Marvel Plan, MD PhD Stroke  Neurology 02/06/2018 2:11 PM  To contact Stroke Continuity provider, please refer to [email or web address]. After hours, contact General Neurology

## 2018-02-06 NOTE — Progress Notes (Signed)
EKG completed and in chart. Lopressor admin. Vital stabilized.  Cherene Altes, RN

## 2018-02-06 NOTE — Progress Notes (Signed)
Monica Choi  ZOX:[id number] DOB: September 12, 1933 DOA: 01/18/2018 PCP: [facility]    Brief Narrative:  84 y.o. female with a hx of dementia, wheelchair-bound, HTN, DM2, sinus node dysfunction status post pacemaker, HLD, and HTN who was brought to the ER after a possible syncopal spell at her facility, after which she was found to have developed right-sided weakness and difficulty talking.  CT head showed multifocal cerebral infarcts which were completed. Unable to do MRI due pacemaker. Patient's blood pressure initially was more than 220 systolic.  Patient remains poorly responsive, with right sided hemiparesis/hemiplegia, aphasia and dysphagia -Swallow evaluation, now on tube feeds via core track -Hospitalization complicated by seizures, started Keppra 10/31 -11/1: palliative medicine consulted for Goals of care  Subjective: -remains poorly responsive, low grade fevers overnight, some cough  Assessment & Plan:  Left brain subcortical infarcts  -CT head showed multifocal old infarcts which are new since March/2019 in the right occipital right frontal periventricular white matter and left cerebellar with generalized atrophy and small vessel disease , CTA head and neck showed bilateral atherosclerosis which was severe and progressed  -Unable to have MRI due to pacemaker -Now with dense right hemiplegia, dysphagia, aphasia -Echocardiogram showed EF of 60 to 65% -LDL is 130, on statin, hemoglobin A1c 6.9 -Neurology following on aspirin 325 mg daily -Hospitalization complicated by seizures, started IV Keppra 10/31 -prognosis overall appears quite poor, not significant improvement on AEDs, poor prognosis discussed with grand daughter yesterday -Palliative medicine consulted for goals of care,   Hypertension with Hypertensive urgency: -BP was 250 x 1 1 8  on arrival, treated briefly, remains elevated -s/p permissive HTN -BP remains elevated, added hydralazine,  amlodipine  Hyperlipidemia:  -Cont Zocor  Dementia -Stable  CKD Stage 3 -Stable  Diabetes mellitus type  2:  -Controlled, hemoglobin A1c is 6.9  DVT prophylaxis: lovenox  Code Status: DNR - NO CODE Family Communication: Discussed with granddaughter at bedside Disposition Plan: pending Palliative meeting  Consultants:  Neuro - Stroke Team Palliative medicine  Antimicrobials:  none  Objective: Blood pressure (!) 190/69, pulse 85, temperature 99.2 F (37.3 C), temperature source Axillary, resp. rate (!) 23, height 5\' 4"  (1.626 m), weight 67.7 kg, SpO2 92 %.  Intake/Output Summary (Last 24 hours) at 02/06/2018 1302 Last data filed at 02/06/2018 1219 Gross per 24 hour  Intake 1746 ml  Output -  Net 1746 ml   Filed Weights   02/04/18 0500 02/05/18 0500 02/06/18 0500  Weight: 68.2 kg 70.6 kg 67.7 kg    Examination: Gen: Somnolent, poorly responsive elderly female, moans to painful stimuli HEENT: PERRLA, NG tube in situ Lungs: scattered ronchi, esp at bases CVS: S1S2/RRR Abd: soft, Non tender, non distended, BS present Extremities: trace edema Skin: no new rashes Neuro: Decreased responsiveness, dense right hemiplegia  CBC: Recent Labs  Lab 01/26/2018 1613 02/03/2018 1649 02/02/18 0837 02/06/18 0805  WBC 9.1  --  9.7 10.9*  NEUTROABS 6.2  --   --   --   HGB 13.9 14.3 13.0 11.2*  HCT 44.0 42.0 41.9 36.6  MCV 96.7  --  95.2 98.9  PLT 273  --  277 222   Basic Metabolic Panel: Recent Labs  Lab 01/10/2018 1613 01/19/2018 1649 02/02/18 0837 02/06/18 0805  NA 138 140 139 148*  K 4.8 3.9 4.1 3.8  CL 105 107 109 113*  CO2 22  --  20* 26  GLUCOSE 130* 134* 193* 272*  BUN 26* 28* 25* 54*  CREATININE  1.28* 1.30* 1.18* 1.49*  CALCIUM 9.9  --  9.5 9.2   GFR: Estimated Creatinine Clearance: 28 mL/min (A) (by C-G formula based on SCr of 1.49 mg/dL (H)).  Liver Function Tests: Recent Labs  Lab Feb 15, 2018 1613 02/02/18 0837  AST 27 16  ALT 17 15  ALKPHOS 94 81   BILITOT 0.9 0.8  PROT 7.0 6.2*  ALBUMIN 3.9 3.4*    Coagulation Profile: Recent Labs  Lab 02-15-18 1613  INR 0.93   HbA1C: Hgb A1c MFr Bld  Date/Time Value Ref Range Status  02/02/2018 08:37 AM 6.9 (H) 4.8 - 5.6 % Final    Comment:    (NOTE) Pre diabetes:          5.7%-6.4% Diabetes:              >6.4% Glycemic control for   <7.0% adults with diabetes   03/03/2017 05:43 AM 7.4 (H) 4.8 - 5.6 % Final    Comment:    (NOTE) Pre diabetes:          5.7%-6.4% Diabetes:              >6.4% Glycemic control for   <7.0% adults with diabetes     Scheduled Meds: . aspirin  325 mg Per Tube Daily   Or  . aspirin  300 mg Rectal Daily  . enoxaparin (LOVENOX) injection  30 mg Subcutaneous Daily  . free water  200 mL Per Tube Q4H  . hydrALAZINE  25 mg Per Tube Q8H  . insulin aspart  0-9 Units Subcutaneous Q4H  . insulin glargine  15 Units Subcutaneous BID  . iopamidol  100 mL Intravenous Once  . ipratropium-albuterol  3 mL Nebulization QID  . levETIRAcetam  500 mg Per Tube BID  . metoprolol tartrate  50 mg Per Tube BID  . pantoprazole sodium  40 mg Per Tube Daily  . ramipril  10 mg Oral Daily  . simvastatin  20 mg Per Tube QHS  . vitamin B-12  1,000 mcg Oral Daily     LOS: 4 days ,  Zannie Cove, MD [facility] Office  [phone] Pager - Text Page per Amion  If 7PM-7AM, please contact night-coverage per Amion 02/06/2018, 1:02 PM

## 2018-02-06 NOTE — Progress Notes (Addendum)
Pt's tachycardic and tachypnic at this time. HR maintaining in the lows 120s and can be as high as 140s. RR 25. MD paged. EKG with x1 dose lopressor 5mg . Continued to monitor VS.  Patient continue to stay obtunded throughout shift. Will cont to Q2 hr turn along with oral care.    Jodi Kappes.

## 2018-02-06 NOTE — Progress Notes (Signed)
Pt's O2 sat, ranging between 90-94, on 2L/min, consulted with respiratory and started humidification and increased O2 supply to 5l/min, fine crackles audible on the right side, HOB elevated above 30 degrees, pt does not show signs of respiratory distress but congested, weak cough, continue to monitor

## 2018-02-07 DIAGNOSIS — I6340 Cerebral infarction due to embolism of unspecified cerebral artery: Secondary | ICD-10-CM

## 2018-02-07 DIAGNOSIS — Z515 Encounter for palliative care: Secondary | ICD-10-CM

## 2018-02-07 LAB — BASIC METABOLIC PANEL
Anion gap: 6 (ref 5–15)
BUN: 49 mg/dL — AB (ref 8–23)
CHLORIDE: 116 mmol/L — AB (ref 98–111)
CO2: 27 mmol/L (ref 22–32)
Calcium: 9.2 mg/dL (ref 8.9–10.3)
Creatinine, Ser: 1.27 mg/dL — ABNORMAL HIGH (ref 0.44–1.00)
GFR calc Af Amer: 45 mL/min — ABNORMAL LOW (ref >60)
GFR calc non Af Amer: 38 mL/min — ABNORMAL LOW (ref >60)
GLUCOSE: 230 mg/dL — AB (ref 70–99)
POTASSIUM: 3.6 mmol/L (ref 3.5–5.1)
SODIUM: 149 mmol/L — AB (ref 135–145)

## 2018-02-07 LAB — CBC
HCT: 39.5 % (ref 36.0–46.0)
Hemoglobin: 11.6 g/dL — ABNORMAL LOW (ref 12.0–15.0)
MCH: 29.1 pg (ref 26.0–34.0)
MCHC: 29.4 g/dL — AB (ref 30.0–36.0)
MCV: 99.2 fL (ref 80.0–100.0)
Platelets: 232 10*3/uL (ref 150–400)
RBC: 3.98 MIL/uL (ref 3.87–5.11)
RDW: 12.5 % (ref 11.5–15.5)
WBC: 12.1 10*3/uL — ABNORMAL HIGH (ref 4.0–10.5)
nRBC: 0 % (ref 0.0–0.2)

## 2018-02-07 LAB — GLUCOSE, CAPILLARY
GLUCOSE-CAPILLARY: 244 mg/dL — AB (ref 70–99)
Glucose-Capillary: 200 mg/dL — ABNORMAL HIGH (ref 70–99)
Glucose-Capillary: 208 mg/dL — ABNORMAL HIGH (ref 70–99)

## 2018-02-07 MED ORDER — LORAZEPAM 2 MG/ML IJ SOLN: 1.0000 mg | INTRAMUSCULAR | Status: DC | PRN

## 2018-02-07 MED ORDER — FREE WATER
300.0000 mL | Status: DC
  Administered 2018-02-07: 300 mL

## 2018-02-07 MED ORDER — MORPHINE SULFATE (PF) 2 MG/ML IV SOLN
2.0000 mg | INTRAVENOUS | Status: DC | PRN
  Administered 2018-02-07 – 2018-02-09 (×6): 2 mg via INTRAVENOUS
  Filled 2018-02-07 (×6): qty 1

## 2018-02-07 NOTE — Consult Note (Signed)
Consultation Note Date: 02/07/2018   Patient Name: Monica Choi  DOB: [birth year]  MRN: [id number]  Age / Sex: 84 y.o., female  PCP: Ginette Otto, [facility] Referring Physician: Zannie Cove, MD  Reason for Consultation: Establishing goals of care  HPI/Patient Profile: 84 y.o. female  with past medical history of dementia, hypertension, diabetes, sinus node dysfunction status post PPM, hyperlipidemia, hypertension, who was admitted on Feb 08, 2018 with right-sided weakness and aphasia.  Symptoms resolved shortly after patient presented to the ER.  Patient was initially admitted for TIA work-up.  Initial head CT showed multifocal cerebral infarcts.  CTA revealed severe bilateral arthrosclerosis.  Her hospitalization was complicated by worsening mental status, hypertensive urgency, and seizures.  Repeat noncontrasted head CT on 11/2 revealed acute nonhemorrhagic left ACA infarct with mass-effect and effacement of the sulci.  Palliative care has been consulted to help address goals of care.  Clinical Assessment and Goals of Care: Patient is nonresponsive and unable to participate in conversations regarding goals.  She is being fed artificially via NGT.  No family present.  I called and spoke with patient's son, who says he is her decision-maker.  Son confirms having spoken with Dr. Jomarie Longs yesterday.  He says he recognizes that patient's condition is poor and the patient is unlikely to demonstrate meaningful improvement.  He verbalizes a belief that patient is at end-of-life.  He tells me that family initially wanted to wait until early next week to see if there was improvement.  However, son says that family talked about options last night and reviewed patient's living will, which clearly states that patient would not want her life prolonged with artificial nutrition.  Son says that family would like to honor  patient's wishes.  They are NOT interested in a PEG or continued aggressive medical care.  We discussed the option of comfort care in detail including discontinuation of IV fluids and tube feeds and removal of NGT.  We also discussed the option of hospice either at [facility] or a residential hospice facility.  Son states clearly that he would like me to initiate comfort care orders and proceed with discontinuation of the NGT and other non-symptomatic medications.  He asks that we pursue transfer to [facility] in [place] for hospice and end-of-life care.  Prior to this hospitalization, patient was living at [facility] in [place] and then moved to [facility] in [place] in May 2019.  At baseline, patient was ambulatory with use of a walker.  She had dementia but was conversive and recognized family.  Patient has a son and daughter.  Son lives in [place].  There are 2 granddaughters who live nearby, both of whom are involved in patient's care.  SUMMARY OF RECOMMENDATIONS   1.  Initiate comfort care 2.  DNR confirmed 3.  Social work consult to facilitate transfer to hospice facility 4.  Discontinue tube feeds per family request     Primary Diagnoses: Present on Admission: . TIA (transient ischemic attack) . Sick sinus syndrome (HCC) . Hypertensive urgency .  HLD (hyperlipidemia) . Dementia (HCC) . Acute CVA (cerebrovascular accident) (HCC)   I have reviewed the medical record, interviewed the patient and family, and examined the patient. The following aspects are pertinent.  Past Medical History:  Diagnosis Date  . Anemia   . Anxiety   . Arrhythmia   . Diabetes mellitus without complication (HCC)   . Diverticulitis   . GERD (gastroesophageal reflux disease)   . Hypertension   . Lewy body dementia (HCC)   . Paranoia (HCC)   . Presence of permanent cardiac pacemaker    hx/notes 11/25/2016  . Rectal prolapse   . Renal disorder   . Rhabdomyolysis 11/2016  . Sick sinus  syndrome (HCC)    hx/notes 11/25/2016  . Sinus node dysfunction (HCC)   . TIA (transient ischemic attack) 07/2016; 01/19/2018   hx/notes 11/25/2016;/son-in-law   Social History   Socioeconomic History  . Marital status: Widowed    Spouse name: Not on file  . Number of children: Not on file  . Years of education: Not on file  . Highest education level: Not on file  Occupational History  . Not on file  Social Needs  . Financial resource strain: Not on file  . Food insecurity:    Worry: Not on file    Inability: Not on file  . Transportation needs:    Medical: Not on file    Non-medical: Not on file  Tobacco Use  . Smoking status: Former Games developer  . Smokeless tobacco: Never Used  . Tobacco comment: "quit sometime in the 1990s"  Substance and Sexual Activity  . Alcohol use: No  . Drug use: No  . Sexual activity: Not Currently  Lifestyle  . Physical activity:    Days per week: Not on file    Minutes per session: Not on file  . Stress: Not on file  Relationships  . Social connections:    Talks on phone: Not on file    Gets together: Not on file    Attends religious service: Not on file    Active member of club or organization: Not on file    Attends meetings of clubs or organizations: Not on file    Relationship status: Not on file  Other Topics Concern  . Not on file  Social History Narrative  . Not on file   Family History  Problem Relation Age of Onset  . Heart attack Father   . Heart attack Brother    Scheduled Meds: . amLODipine  10 mg Per Tube Daily  . aspirin  325 mg Per Tube Daily   Or  . aspirin  300 mg Rectal Daily  . enoxaparin (LOVENOX) injection  30 mg Subcutaneous Daily  . free water  300 mL Per Tube Q4H  . hydrALAZINE  25 mg Per Tube Q8H  . insulin aspart  0-9 Units Subcutaneous Q4H  . insulin glargine  15 Units Subcutaneous BID  . iopamidol  100 mL Intravenous Once  . ipratropium-albuterol  3 mL Nebulization QID  . levETIRAcetam  500 mg Per  Tube BID  . metoprolol tartrate  50 mg Per Tube BID  . pantoprazole sodium  40 mg Per Tube Daily  . ramipril  10 mg Oral Daily  . simvastatin  20 mg Per Tube QHS  . vitamin B-12  1,000 mcg Oral Daily   Continuous Infusions: . feeding supplement (JEVITY 1.2 CAL) 1,000 mL (02/07/18 0444)   PRN Meds:.acetaminophen **OR** [DISCONTINUED] acetaminophen (TYLENOL) oral liquid 160 mg/5  mL **OR** acetaminophen, dicyclomine, hydrALAZINE, metoprolol tartrate, ondansetron (ZOFRAN) IV Medications Prior to Admission:  Prior to Admission medications   Medication Sig Start Date End Date Taking? Authorizing Provider  aspirin EC 81 MG EC tablet Take 1 tablet (81 mg total) by mouth daily. 09/19/16  Yes Tillman Sers, DO  Cholecalciferol (VITAMIN D) 2000 units CAPS Take 2,000 Units by mouth daily.    Yes [provider]  dicyclomine (BENTYL) 10 MG capsule Take 10 mg by mouth every 8 (eight) hours as needed for spasms.   Yes [provider]  DULoxetine (CYMBALTA) 30 MG capsule Take 30 mg by mouth 2 (two) times daily.   Yes [provider]  meloxicam (MOBIC) 7.5 MG tablet Take 7.5 mg by mouth daily as needed for pain.   Yes [provider]  pantoprazole (PROTONIX) 20 MG tablet Take 20 mg by mouth daily. 04/08/16  Yes [provider]  ramipril (ALTACE) 10 MG capsule Take 10 mg by mouth daily.   Yes [provider]  risperiDONE (RISPERDAL) 0.25 MG tablet Take 0.25 mg by mouth at bedtime.    Yes [provider]  simvastatin (ZOCOR) 10 MG tablet Take 10 mg by mouth at bedtime.   Yes [provider]  vitamin B-12 (CYANOCOBALAMIN) 1000 MCG tablet Take 1,000 mcg by mouth daily.   Yes [provider]  carvedilol (COREG) 12.5 MG tablet Take 1 tablet (12.5 mg total) by mouth 2 (two) times daily. Patient not taking: Reported on 01/14/2018 07/17/16   Regan Lemming, MD  metoCLOPramide (REGLAN) 10 MG tablet Take 1 tablet (10 mg total) by  mouth every 6 (six) hours as needed (for nausea/headache). Patient not taking: Reported on 06/16/2017 09/16/16   Molpus, John, MD  ramipril (ALTACE) 5 MG capsule Take 1 capsule (5 mg total) by mouth daily. Patient not taking: Reported on 01/31/2018 01/19/17   Sharyn Creamer, MD  traMADol (ULTRAM) 50 MG tablet Take 1 tablet (50 mg total) by mouth every 12 (twelve) hours as needed for severe pain. Patient not taking: Reported on 01/18/2017 11/28/16   Marthenia Rolling, DO   Allergies  Allergen Reactions  . Morphine Nausea And Vomiting  . Sulfa Antibiotics Anaphylaxis and Rash  . Amoxicillin Other (See Comments)  . Benzonatate Other (See Comments)  . Buprenorphine Nausea And Vomiting  . Fish-Derived Products Nausea Only  . Penicillins Hives    Has patient had a PCN reaction causing immediate rash, facial/tongue/throat swelling, SOB or lightheadedness with hypotension: Yes Has patient had a PCN reaction causing severe rash involving mucus membranes or skin necrosis: Unknown Has patient had a PCN reaction that required hospitalization: Unknown Has patient had a PCN reaction occurring within the last 10 years: No If all of the above answers are "NO", then may proceed with Cephalosporin use.   . Ciprofloxacin Itching and Rash  . Morphine And Related Nausea And Vomiting   Review of Systems  Unable to perform ROS   Physical Exam  Constitutional:  Ill-appearing  Cardiovascular:  Irregular  Pulmonary/Chest: Effort normal and breath sounds normal.  Abdominal: Soft. Bowel sounds are normal.  Neurological:  Unresponsive to noxious stimuli  Skin: Skin is warm and dry.    Vital Signs: BP (!) 128/50   Pulse 80   Temp 98.6 F (37 C) (Axillary)   Resp 17   Ht 5\' 4"  (1.626 m)   Wt 68.2 kg   SpO2 96%   BMI 25.81 kg/m  Pain Scale: Faces  Pain Score: 0-No pain   SpO2: SpO2: 96 % O2 Device:SpO2: 96 % O2 Flow Rate: .O2 Flow Rate (L/min): 5 L/min  IO: Intake/output summary:   Intake/Output  Summary (Last 24 hours) at 02/07/2018 0842 Last data filed at 02/07/2018 1610 Gross per 24 hour  Intake 1525.83 ml  Output -  Net 1525.83 ml    LBM:   Baseline Weight: Weight: 63.5 kg Most recent weight: Weight: 68.2 kg     Palliative Assessment/Data:     Time In: 0800 Time Out: 0900 Time Total: 60 minutes Greater than 50%  of this time was spent counseling and coordinating care related to the above assessment and plan.  Signed by: Malachy Moan, NP   Please contact Palliative Medicine Team phone at [phone] for questions and concerns.  For individual provider: See Loretha Stapler

## 2018-02-07 NOTE — Progress Notes (Signed)
STROKE TEAM PROGRESS NOTE   INTERVAL HISTORY Her RN is at the bedside.  Pt still obtunded and not response to voice. Learned from chart that palliative care involved and now pt in comfort care status and pending for [facility] transfer.   Vitals:   02/07/18 0500 02/07/18 0615 02/07/18 0733 02/07/18 0828  BP: (!) 200/78 (!) 119/49 (!) 128/50   Pulse:   80   Resp:   14 17  Temp:   98.6 F (37 C)   TempSrc:   Axillary   SpO2:      Weight: 68.2 kg     Height:        CBC:  Recent Labs  Lab March 01, 2018 1613  02/06/18 0805 02/07/18 0454  WBC 9.1   < > 10.9* 12.1*  NEUTROABS 6.2  --   --   --   HGB 13.9   < > 11.2* 11.6*  HCT 44.0   < > 36.6 39.5  MCV 96.7   < > 98.9 99.2  PLT 273   < > 222 232   < > = values in this interval not displayed.    Basic Metabolic Panel:  Recent Labs  Lab 02/06/18 0805 02/07/18 0454  NA 148* 149*  K 3.8 3.6  CL 113* 116*  CO2 26 27  GLUCOSE 272* 230*  BUN 54* 49*  CREATININE 1.49* 1.27*  CALCIUM 9.2 9.2   Lipid Panel:     Component Value Date/Time   CHOL 208 (H) 02/02/2018 0837   TRIG 161 (H) 02/02/2018 0837   HDL 46 02/02/2018 0837   CHOLHDL 4.5 02/02/2018 0837   VLDL 32 02/02/2018 0837   LDLCALC 130 (H) 02/02/2018 0837   HgbA1c:  Lab Results  Component Value Date   HGBA1C 6.9 (H) 02/02/2018   Urine Drug Screen:     Component Value Date/Time   LABOPIA NONE DETECTED 03/01/2018 1533   COCAINSCRNUR NONE DETECTED 2018-03-01 1533   COCAINSCRNUR NONE DETECTED 03/02/2017 1043   LABBENZ NONE DETECTED 2018/03/01 1533   AMPHETMU NONE DETECTED 2018-03-01 1533   THCU NONE DETECTED 2018-03-01 1533   LABBARB NONE DETECTED Mar 01, 2018 1533    Alcohol Level     Component Value Date/Time   ETH <10 Mar 01, 2018 1613    IMAGING  Ct Head Wo Contrast 02/06/2018 IMPRESSION: 1. Acute nonhemorrhagic left ACA territory infarct is now evident. There is mass effect with effacement of the sulci and some downward depression on the left lateral ventricle.  No midline shift isbpresent. 2. Acute nonhemorrhagic infarct of the inferior left cerebellum is now evident. 3. Remote right occipital pole infarct. 4. Stable diffuse white matter disease. 5. Atherosclerosis.    Ct Angio Head W Or Wo Contrast Ct Angio Neck W Or Wo Contrast 02/02/2018 IMPRESSION:  Atherosclerotic disease of the carotid bifurcation bilaterally without flow limiting stenosis. Both vertebral arteries widely patent to the basilar. Severe intracranial atherosclerotic disease as above. This has progressed markedly since 2018. No emergent large vessel occlusion.    Ct Head Wo Contrast 2018-03-01 IMPRESSION:  1. Interval development of multifocal infarcts since 06/16/2017, involving the right occipital lobe, right frontal periventricular white matter and left cerebellar hemisphere. These all appear completed.  2. No CT evidence of acute infarct or hemorrhage.  3. Atrophy and chronic small vessel ischemic changes.     Dg Chest Port 1 View  02/06/2018 IMPRESSION:  Bilateral lower lobe atelectatic changes. Enlarged cardiac silhouette and calcific atherosclerotic disease of the aorta.     PHYSICAL  EXAM Elderly lady, obtunded, lying in bed, mouth breathing. Afebrile. Head is nontraumatic. Neck is supple without bruit. Cardiac exam no murmur or gallop. Lungs are clear to auscultation. Distal pulses are well felt.  Neurological Exam : obtunded, mouth breathing, eyes closed and not able to arouse with voice or pain. No language output. Not follow commands. With eyes forced open, no blinking to visual threat. Eyes midposition, no gaze preference, sluggish doll's eyes. Positive corneal and gag. Right facial mild droopy. Tongue midline in mouth. Dense right hemiplegia, no movement on pain. Withdraw to pain on the LUE and LLE, but did not against gravity. DTR 1+ and no babinski. Decreased tone on the right. Right plantar equivocal left downgoing. Sensation, coordination and gait not  tested.   ASSESSMENT/PLAN Ms. Baani Bober is a 84 y.o. female with history of HTN, TIA, SSS with pacer, DM, GERD presenting with altered mental status, R HP and hypertensive.   Stroke:  Large left ACA and left PICA infarcts, embolic pattern, unknown etiology - pacer did not show afib  CT head 01/17/2018 multifocal small old infarcts new since March 2019 in the right occipital, right frontal periventricular white matter and left cerebellar hemisphere.    CTA head & neck B siphon atherosclerosis. No LVO.  CT repeat 02/06/18 large left ACA and left PICA infarcts  2D Echo EF 60% to 65%.  Pacer interrogation showed no afib  LDL 130  HgbA1c 6.9  Lovenox 30 mg sq daily for VTE prophylaxis  aspirin 81 mg daily prior to admission, was on aspirin 325 mg daily. Now on comfort care measures  Disposition:  Palliative care involved and family now requested comfort care and [facility] transfer   New-right body focal seizures on 02/04/18  LTM EEG no seizure  On keppra 1000mg  bid  Decrease keppra to 500mg  bid yesterday  Now off keppra due to comfort care measures  Encephalopathy  Likely multifactorial due to large stroke, hypernatremia, hyperglycemia, dehydration, AKI  Still obtunded and not responsive  On comfort care measures now  Hypertensive Urgency  Blood pressure 250/118 on arrival   Remain elevated . Put on hydralazine, and resume metoprolol and ramipril  . Now off meds for comfort measures  Hyperlipidemia  Home meds: Zocor 10, resumed in hospital  LDL 130, goal < 70  Diabetes type II  HgbA1c 6.9, goal < 7.0  Controlled at home  However, significant hyperglycemia  Hypernatremia and AKI  Na 148  Creatinine - 1.49 -> 1.27  Dysphagia, secondary to stroke  Failed swallow  NPO  Off cortrak for comfort care  Other Stroke Risk Factors  Advanced age  Hx stroke/TIA  Other Active Problems  Baseline dementia  Chronic kidney disease stage III Cre  1.18->1.49->1.27  SSS s/p pacer - interrogated no afib  Hospital day # 5   Neurology will sign off. Please call with questions. Thanks for the consult.  Marvel Plan, MD PhD Stroke Neurology 02/07/2018 2:39 PM    To contact Stroke Continuity provider, please refer to [email or web address]. After hours, contact General Neurology

## 2018-02-07 NOTE — Progress Notes (Signed)
S/p Palliative care meeting, now COMFORT CARE and decision made for [facility]  Zannie Cove, MD

## 2018-02-07 NOTE — Progress Notes (Signed)
[facility] [place]: Registration Visit 1030  Received request Palliative Care (Joshua)a and CSW for family interest in [facility]. Chart reviewed and spoke with (son-Lee) to acknowledge referral. Unfortunately [facility] is not able to offer a room today. Family (son-Lee) and CSW Interior and spatial designer) are aware. [facility] liaison will follow up with CSW and family tomorrow or sooner if room becomes available. Please do not hesitate to call with questions.   Than you.  Elliot Cousin, RN, BSN [facility] Liaison [phone]

## 2018-02-08 NOTE — Consult Note (Signed)
   [facility] CM Inpatient Consult   02/08/2018  Nozomi Mettler 1933-08-18 [id number]   Patient screened for restart for [facility] Care Management services and has [employer] plan.  Chart review reveals that the patient is transitioning to comfort care.  Will sign off at this time.     Please place a [facility] Care Management consult if changes or for questions contact:   Charlesetta Shanks, RN BSN CCM [facility]  [phone] business mobile phone Toll free office [phone]

## 2018-02-08 NOTE — Progress Notes (Signed)
Nutrition Brief Note  Chart reviewed. Pt now transitioning to comfort care.  No further nutrition interventions warranted at this time.  Please re-consult as needed.   Lawsen Arnott RD, LDN, CNSC [phone] Pager [phone] After Hours Pager    

## 2018-02-08 NOTE — Progress Notes (Signed)
[facility]waiting [facility] -remains obtunded  Zannie Cove, MD

## 2018-02-09 DIAGNOSIS — F0150 Vascular dementia without behavioral disturbance: Secondary | ICD-10-CM

## 2018-02-09 DIAGNOSIS — Z515 Encounter for palliative care: Secondary | ICD-10-CM

## 2018-02-09 DIAGNOSIS — R0902 Hypoxemia: Secondary | ICD-10-CM

## 2018-02-09 MED ORDER — MORPHINE 100MG IN NS 100ML (1MG/ML) PREMIX INFUSION
2.0000 mg/h | INTRAVENOUS | Status: DC
  Administered 2018-02-09: 2 mg/h via INTRAVENOUS
  Filled 2018-02-09: qty 100

## 2018-02-09 MED ORDER — HALOPERIDOL LACTATE 2 MG/ML PO CONC
0.5000 mg | ORAL | Status: DC | PRN
  Filled 2018-02-09: qty 0.3

## 2018-02-09 MED ORDER — BIOTENE DRY MOUTH MT LIQD: 15.0000 mL | OROMUCOSAL | Status: DC | PRN

## 2018-02-09 MED ORDER — GLYCOPYRROLATE 1 MG PO TABS
1.0000 mg | ORAL_TABLET | ORAL | Status: DC | PRN
  Filled 2018-02-09: qty 1

## 2018-02-09 MED ORDER — HALOPERIDOL LACTATE 5 MG/ML IJ SOLN: 0.5000 mg | INTRAMUSCULAR | Status: DC | PRN

## 2018-02-09 MED ORDER — MORPHINE SULFATE (PF) 2 MG/ML IV SOLN: 2.0000 mg | INTRAVENOUS | Status: DC | PRN

## 2018-02-09 MED ORDER — POLYVINYL ALCOHOL 1.4 % OP SOLN
1.0000 [drp] | Freq: Four times a day (QID) | OPHTHALMIC | Status: DC | PRN
  Filled 2018-02-09: qty 15

## 2018-02-09 MED ORDER — GLYCOPYRROLATE 0.2 MG/ML IJ SOLN: 0.2000 mg | INTRAMUSCULAR | Status: DC | PRN

## 2018-02-09 MED ORDER — LORAZEPAM 2 MG/ML IJ SOLN
1.0000 mg | INTRAMUSCULAR | Status: DC | PRN
  Filled 2018-02-09: qty 1

## 2018-02-09 MED ORDER — MORPHINE BOLUS VIA INFUSION
2.0000 mg | INTRAVENOUS | Status: DC | PRN
  Filled 2018-02-09: qty 2

## 2018-02-09 MED ORDER — HALOPERIDOL 0.5 MG PO TABS
0.5000 mg | ORAL_TABLET | ORAL | Status: DC | PRN
  Filled 2018-02-09: qty 1

## 2018-02-09 MED ORDER — GLYCOPYRROLATE 0.2 MG/ML IJ SOLN
0.6000 mg | Freq: Three times a day (TID) | INTRAMUSCULAR | Status: DC
  Administered 2018-02-09: 0.6 mg via INTRAVENOUS
  Filled 2018-02-09: qty 3

## 2018-03-07 NOTE — Progress Notes (Signed)
Monica Choi  WGN:[id number] DOB: [birth year] DOA: 2018-02-22 PCP: [facility]    Brief Narrative:  84 y.o. female with a hx of dementia, wheelchair-bound, HTN, DM2, sinus node dysfunction status post pacemaker, HLD, and HTN who was brought to the ER after a possible syncopal spell at her facility, after which she was found to have developed right-sided weakness and difficulty talking.  CT head showed multifocal cerebral infarcts which were completed. Unable to do MRI due pacemaker.  - Patient remains poorly responsive, with right sided hemiparesis/hemiplegia, aphasia and dysphagia -Swallow evaluation, now on tube feeds via core track -Hospitalization complicated by seizures, started Keppra 10/31 -11/1: palliative medicine consulted for Goals of care -s/p post family meeting 11/3 was transition to full comfort care and plan for residential hospice  Subjective: -Remains obtunded, unresponsive, more hypoxemic  Assessment & Plan:  Left brain subcortical infarcts  -CT head showed multifocal old infarcts which are new since March/2019 in the right occipital right frontal periventricular white matter and left cerebellar with generalized atrophy and small vessel disease , CTA head and neck showed bilateral atherosclerosis which was severe and progressed  -Unable to have MRI due to pacemaker -Now with dense right hemiplegia, dysphagia, aphasia, decreased responsiveness -Echocardiogram showed EF of 60 to 65% -LDL is 130, on statin, hemoglobin A1c 6.9 -Neurology following on aspirin 325 mg daily -Hospitalization complicated by seizures, started IV Keppra 10/31 -prognosis felt to be very poor, palliative medicine consulted, status post goals of care meeting -Now comfort care awaiting residential hospice  Hypertension with Hypertensive urgency: -Comfort measures  Hyperlipidemia:  -Comfort measures  Dementia -  CKD Stage 3   Diabetes mellitus type  2:  -hemoglobin A1c is  6.9  DVT prophylaxis: none Code Status: DNR - NO CODE Family Communication: Discussed with granddaughter at bedside few days ago Disposition Plan: Awaiting residential hospice  Consultants:  Neuro - Stroke Team Palliative medicine  Antimicrobials:  none  Objective: Blood pressure (!) 165/65, pulse (!) 102, temperature 100.2 F (37.9 C), temperature source Oral, resp. rate (!) 22, height 5\' 4"  (1.626 m), weight 68.2 kg, SpO2 (!) 83 %. No intake or output data in the 24 hours ending 03/03/2018 1335 Filed Weights   02/05/18 0500 02/06/18 0500 02/07/18 0500  Weight: 70.6 kg 67.7 kg 68.2 kg    Examination: Unresponsive -HEENT pupils small but reactive -CVS S1-S2/tachycardic -Lungs with bilateral scattered rhonchi -Abdomen is soft mildly distended, bowel sounds present -Extremities with positive edema -Neuro unresponsive with profound left hemiplegia  CBC: Recent Labs  Lab 02/06/18 0805 02/07/18 0454  WBC 10.9* 12.1*  HGB 11.2* 11.6*  HCT 36.6 39.5  MCV 98.9 99.2  PLT 222 232   Basic Metabolic Panel: Recent Labs  Lab 02/06/18 0805 02/07/18 0454  NA 148* 149*  K 3.8 3.6  CL 113* 116*  CO2 26 27  GLUCOSE 272* 230*  BUN 54* 49*  CREATININE 1.49* 1.27*  CALCIUM 9.2 9.2   GFR: Estimated Creatinine Clearance: 33 mL/min (A) (by C-G formula based on SCr of 1.27 mg/dL (H)).  Liver Function Tests: No results for input(s): AST, ALT, ALKPHOS, BILITOT, PROT, ALBUMIN in the last 168 hours.  Coagulation Profile: No results for input(s): INR, PROTIME in the last 168 hours. HbA1C: Hgb A1c MFr Bld  Date/Time Value Ref Range Status  02/02/2018 08:37 AM 6.9 (H) 4.8 - 5.6 % Final    Comment:    (NOTE) Pre diabetes:  5.7%-6.4% Diabetes:              >6.4% Glycemic control for   <7.0% adults with diabetes   03/03/2017 05:43 AM 7.4 (H) 4.8 - 5.6 % Final    Comment:    (NOTE) Pre diabetes:          5.7%-6.4% Diabetes:              >6.4% Glycemic control for    <7.0% adults with diabetes     Scheduled Meds: . glycopyrrolate  0.6 mg Intravenous TID     LOS: 7 days ,  Zannie Cove, MD [facility] Office  [phone] Pager - Text Page per Loretha Stapler  If 7PM-7AM, please contact night-coverage per Amion 02/26/2018, 1:35 PM

## 2018-03-07 NOTE — Progress Notes (Signed)
Pt passed away at 1645. Death was expected; pt was on comfort care. Cindy Lukins(POA) was notified @ 1730. Awaiting for information on funeral home arrangements. MD Jomarie Longs Notified.

## 2018-03-07 NOTE — Progress Notes (Signed)
Postmortem care has been done. Pt POA Cindy  Wojciak informed RN that she is waiting on family to return her call to see if more family Is going to come say their "good bye's" . Pt has a ring on her right hand that cant be removed because its too small. Funeral home representative "Gearldine Bienenstock" from [employer] in [place] can be reached @ [phone] once pt is read to be picked up.

## 2018-03-07 NOTE — Progress Notes (Signed)
RN Eugenie Norrie China ) along with Victorino Dike Rimmer wasted 75 ml of morphine in Pyxis room waste container. Medication was stopped due to pt passing.

## 2018-03-07 NOTE — Progress Notes (Signed)
Pt was also noted to have a ring on her L hand; that would not come off because the ring was too small on pt's finger.

## 2018-03-07 NOTE — Progress Notes (Signed)
CSW sent referral to [facility], per patient's family request, as [facility] continues to not have any beds available today. [facility] will be unable to inform CSW about bed availability until tomorrow.  CSW called and updated patient's son. CSW to follow.  Blenda Nicely, [place] Clinical Social Worker [phone]

## 2018-03-07 NOTE — Discharge Summary (Signed)
Death Summary  Katalaya Beel ZOX:[id number] DOB: 1933-04-13 DOA: 2018-02-08  PCP: [facility]  Admit date: 2018/02/08 Date of Death: 02-16-18  Final Diagnoses:  Principal Problem:   CVA   Acute hypoxic resp failure   Metabolic encephalopathy   Sick sinus syndrome (HCC)   Hypertensive urgency   HLD (hyperlipidemia)   Dementia (HCC)   Pressure injury of skin   Acute CVA (cerebrovascular accident) [facility])   Palliative care encounter   Hypoxia   End of life care    History of present illness:  node dysfunction status post pacemaker, HLD, and HTN who was brought to the ER after a possible syncopal spell at her facility, after which she was found to have developed right-sided weakness and difficulty talking.  CT head showed multifocal cerebral infarcts which were completed. Unable to do MRI due pacemaker  Hospital Course:   Left brain subcortical infarcts  -admitted with R hemiplegia, aphasia and dysphagia -CT head showed multifocal old infarcts which are new since March/2019 in the right occipital right frontal periventricular white matter and left cerebellar with generalized atrophy and small vessel disease , CTA head and neck showed bilateral atherosclerosis which was severe and progressed  -Neurology was consulted, could not have an MRI due to pacemaker, [facility] showed EF was 60% -continued to have worsening dense right hemiplegia, dysphagia, aphasia, decreased responsiveness -Hospitalization complicated by seizures, started IV Keppra 10/31 -despite this remained poorly responsive with tube feeds, and antiepileptics - prognosis felt to be very poor, palliative medicine consulted, status post goals of care meeting - decision made for comfort care, subsequently expired 17-Feb-2023  Hypertension with Hypertensive urgency: -Comfort measures  Hyperlipidemia:  -Comfort measures  Dementia -was unresponsive for last few days  CKD Stage 3   Diabetes mellitus type  2:   -hemoglobin A1c is 6.9  Time:  Signed:  Zannie Cove  [facility] 03/08/2018, 5:22 PM

## 2018-03-07 NOTE — Progress Notes (Signed)
Called to bedside by RN.  Patient is gurgling loudly, with increased work of breathing, actively dying.  Made adjustments to medications including adding scheduled robinul and morphine gtt.  As patient is actively dying - if we can get her comfortable - I would recommend weaning oxygen down.  She is currently on 6L.  Recommend she not be transported outside of the hospital as she is too unstable.  I will call her family and give them my recommendation not to transfer her.  PMT will continue to round on her with you.  Prognosis: hours   Norvel Richards, PA-C Palliative Medicine Pager: [phone]   Time 25 min.

## 2018-03-07 NOTE — Progress Notes (Signed)
CSW contacted this morning by both [facility] and [facility] that there are no beds available for the patient today. RN informed CSW this afternoon that patient has now become too unstable for transfer to residential hospice, will remain in hospital.   CSW signing off at this time.  Blenda Nicely, [place] Clinical Social Worker [phone]

## 2018-03-07 DEATH — deceased

## 2018-03-27 LAB — CUP PACEART REMOTE DEVICE CHECK
Brady Statistic AP VP Percent: 1 %
Brady Statistic AP VS Percent: 30 %
Brady Statistic AS VP Percent: 1 %
Brady Statistic AS VS Percent: 70 %
Brady Statistic RV Percent Paced: 1 %
Date Time Interrogation Session: 20191011141922
Implantable Lead Implant Date: 20160108
Implantable Lead Location: 753859
Implantable Lead Location: 753860
Implantable Pulse Generator Implant Date: 20160108
Lead Channel Impedance Value: 490 Ohm
Lead Channel Pacing Threshold Amplitude: 1.375 V
Lead Channel Pacing Threshold Pulse Width: 0.4 ms
Lead Channel Sensing Intrinsic Amplitude: 5 mV
Lead Channel Sensing Intrinsic Amplitude: 7.9 mV
Lead Channel Setting Pacing Amplitude: 1.5 V
Lead Channel Setting Pacing Amplitude: 1.625
Lead Channel Setting Pacing Pulse Width: 0.4 ms
Lead Channel Setting Sensing Sensitivity: 2 mV
MDC IDC LEAD IMPLANT DT: 20160108
MDC IDC MSMT BATTERY REMAINING LONGEVITY: 130 mo
MDC IDC MSMT BATTERY REMAINING PERCENTAGE: 95.5 %
MDC IDC MSMT BATTERY VOLTAGE: 2.99 V
MDC IDC MSMT LEADCHNL RA IMPEDANCE VALUE: 380 Ohm
MDC IDC MSMT LEADCHNL RA PACING THRESHOLD AMPLITUDE: 0.5 V
MDC IDC MSMT LEADCHNL RA PACING THRESHOLD PULSEWIDTH: 0.4 ms
MDC IDC STAT BRADY RA PERCENT PACED: 29 %
Pulse Gen Model: 2240
Pulse Gen Serial Number: 7661751
